# Patient Record
Sex: Male | Born: 2000 | Race: Black or African American | Hispanic: No | Marital: Single | Smoking: Never smoker
Health system: Southern US, Community
[De-identification: ages and names within clinical notes are randomized; demographics above are authoritative.]

## PROBLEM LIST (undated history)

## (undated) DIAGNOSIS — G4730 Sleep apnea, unspecified: Secondary | ICD-10-CM

## (undated) DIAGNOSIS — Z9109 Other allergy status, other than to drugs and biological substances: Secondary | ICD-10-CM

## (undated) DIAGNOSIS — J45909 Unspecified asthma, uncomplicated: Secondary | ICD-10-CM

## (undated) DIAGNOSIS — E039 Hypothyroidism, unspecified: Secondary | ICD-10-CM

## (undated) DIAGNOSIS — F909 Attention-deficit hyperactivity disorder, unspecified type: Secondary | ICD-10-CM

## (undated) DIAGNOSIS — E119 Type 2 diabetes mellitus without complications: Secondary | ICD-10-CM

## (undated) DIAGNOSIS — J439 Emphysema, unspecified: Secondary | ICD-10-CM

## (undated) DIAGNOSIS — A749 Chlamydial infection, unspecified: Secondary | ICD-10-CM

---
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
## 2018-11-25 ENCOUNTER — Emergency Department (HOSPITAL_COMMUNITY): Payer: BLUE CROSS/BLUE SHIELD

## 2018-11-25 ENCOUNTER — Encounter (HOSPITAL_COMMUNITY): Payer: Self-pay | Admitting: Emergency Medicine

## 2018-11-25 ENCOUNTER — Inpatient Hospital Stay (HOSPITAL_COMMUNITY): Payer: BLUE CROSS/BLUE SHIELD

## 2018-11-25 ENCOUNTER — Other Ambulatory Visit: Payer: Self-pay

## 2018-11-25 ENCOUNTER — Inpatient Hospital Stay (HOSPITAL_COMMUNITY)
Admission: EM | Admit: 2018-11-25 | Discharge: 2018-11-28 | DRG: 193 | Disposition: A | Discharge status: Home / Self Care | Payer: BLUE CROSS/BLUE SHIELD | Attending: Internal Medicine | Admitting: Internal Medicine

## 2018-11-25 DIAGNOSIS — E782 Mixed hyperlipidemia: Secondary | ICD-10-CM | POA: Diagnosis present

## 2018-11-25 DIAGNOSIS — J439 Emphysema, unspecified: Secondary | ICD-10-CM

## 2018-11-25 DIAGNOSIS — R079 Chest pain, unspecified: Secondary | ICD-10-CM | POA: Diagnosis not present

## 2018-11-25 DIAGNOSIS — Z7151 Drug abuse counseling and surveillance of drug abuser: Secondary | ICD-10-CM

## 2018-11-25 DIAGNOSIS — E66813 Obesity, class 3: Secondary | ICD-10-CM

## 2018-11-25 DIAGNOSIS — E039 Hypothyroidism, unspecified: Secondary | ICD-10-CM

## 2018-11-25 DIAGNOSIS — R0902 Hypoxemia: Secondary | ICD-10-CM | POA: Diagnosis not present

## 2018-11-25 DIAGNOSIS — R0602 Shortness of breath: Secondary | ICD-10-CM | POA: Diagnosis present

## 2018-11-25 DIAGNOSIS — F329 Major depressive disorder, single episode, unspecified: Secondary | ICD-10-CM | POA: Diagnosis present

## 2018-11-25 DIAGNOSIS — F909 Attention-deficit hyperactivity disorder, unspecified type: Secondary | ICD-10-CM | POA: Diagnosis present

## 2018-11-25 DIAGNOSIS — J180 Bronchopneumonia, unspecified organism: Principal | ICD-10-CM | POA: Diagnosis present

## 2018-11-25 DIAGNOSIS — Z833 Family history of diabetes mellitus: Secondary | ICD-10-CM

## 2018-11-25 DIAGNOSIS — R7989 Other specified abnormal findings of blood chemistry: Secondary | ICD-10-CM | POA: Diagnosis present

## 2018-11-25 DIAGNOSIS — Z68.41 Body mass index (BMI) pediatric, greater than or equal to 95th percentile for age: Secondary | ICD-10-CM

## 2018-11-25 DIAGNOSIS — R030 Elevated blood-pressure reading, without diagnosis of hypertension: Secondary | ICD-10-CM | POA: Diagnosis present

## 2018-11-25 DIAGNOSIS — E8881 Metabolic syndrome: Secondary | ICD-10-CM | POA: Diagnosis present

## 2018-11-25 DIAGNOSIS — E119 Type 2 diabetes mellitus without complications: Secondary | ICD-10-CM | POA: Diagnosis present

## 2018-11-25 DIAGNOSIS — Z79899 Other long term (current) drug therapy: Secondary | ICD-10-CM | POA: Diagnosis not present

## 2018-11-25 DIAGNOSIS — F902 Attention-deficit hyperactivity disorder, combined type: Secondary | ICD-10-CM | POA: Diagnosis present

## 2018-11-25 DIAGNOSIS — Z8249 Family history of ischemic heart disease and other diseases of the circulatory system: Secondary | ICD-10-CM

## 2018-11-25 DIAGNOSIS — F321 Major depressive disorder, single episode, moderate: Secondary | ICD-10-CM | POA: Diagnosis not present

## 2018-11-25 DIAGNOSIS — R778 Other specified abnormalities of plasma proteins: Secondary | ICD-10-CM

## 2018-11-25 DIAGNOSIS — A749 Chlamydial infection, unspecified: Secondary | ICD-10-CM

## 2018-11-25 DIAGNOSIS — R071 Chest pain on breathing: Secondary | ICD-10-CM | POA: Diagnosis not present

## 2018-11-25 DIAGNOSIS — R7303 Prediabetes: Secondary | ICD-10-CM | POA: Diagnosis not present

## 2018-11-25 DIAGNOSIS — J9601 Acute respiratory failure with hypoxia: Secondary | ICD-10-CM | POA: Diagnosis present

## 2018-11-25 DIAGNOSIS — F1210 Cannabis abuse, uncomplicated: Secondary | ICD-10-CM | POA: Diagnosis present

## 2018-11-25 DIAGNOSIS — G4733 Obstructive sleep apnea (adult) (pediatric): Secondary | ICD-10-CM | POA: Diagnosis present

## 2018-11-25 DIAGNOSIS — E669 Obesity, unspecified: Secondary | ICD-10-CM | POA: Diagnosis present

## 2018-11-25 HISTORY — DX: Hypothyroidism, unspecified: E03.9

## 2018-11-25 HISTORY — DX: Emphysema, unspecified: J43.9

## 2018-11-25 HISTORY — DX: Chlamydial infection, unspecified: A74.9

## 2018-11-25 LAB — RAPID URINE DRUG SCREEN, HOSP PERFORMED
Amphetamines: NOT DETECTED
Barbiturates: NOT DETECTED
Benzodiazepines: NOT DETECTED
Cocaine: NOT DETECTED
Opiates: NOT DETECTED
Tetrahydrocannabinol: POSITIVE — AB

## 2018-11-25 LAB — CBC WITH DIFFERENTIAL/PLATELET
Abs Immature Granulocytes: 0.02 10*3/uL (ref 0.00–0.07)
BASOS ABS: 0.1 10*3/uL (ref 0.0–0.1)
Basophils Relative: 1 %
Eosinophils Absolute: 0.2 10*3/uL (ref 0.0–0.5)
Eosinophils Relative: 2 %
HEMATOCRIT: 47.8 % (ref 39.0–52.0)
HEMOGLOBIN: 14.5 g/dL (ref 13.0–17.0)
IMMATURE GRANULOCYTES: 0 %
LYMPHS PCT: 38 %
Lymphs Abs: 3.4 10*3/uL (ref 0.7–4.0)
MCH: 25.8 pg — ABNORMAL LOW (ref 26.0–34.0)
MCHC: 30.3 g/dL (ref 30.0–36.0)
MCV: 85.1 fL (ref 80.0–100.0)
Monocytes Absolute: 0.6 10*3/uL (ref 0.1–1.0)
Monocytes Relative: 6 %
NEUTROS PCT: 53 %
NRBC: 0 % (ref 0.0–0.2)
Neutro Abs: 4.7 10*3/uL (ref 1.7–7.7)
Platelets: 425 10*3/uL — ABNORMAL HIGH (ref 150–400)
RBC: 5.62 MIL/uL (ref 4.22–5.81)
RDW: 15.4 % (ref 11.5–15.5)
WBC: 8.9 10*3/uL (ref 4.0–10.5)

## 2018-11-25 LAB — ECHOCARDIOGRAM COMPLETE
Height: 73 in
WEIGHTICAEL: 6400 [oz_av]

## 2018-11-25 LAB — COMPREHENSIVE METABOLIC PANEL
ALBUMIN: 3.9 g/dL (ref 3.5–5.0)
ALT: 32 U/L (ref 0–44)
AST: 24 U/L (ref 15–41)
Alkaline Phosphatase: 69 U/L (ref 38–126)
Anion gap: 8 (ref 5–15)
BILIRUBIN TOTAL: 0.3 mg/dL (ref 0.3–1.2)
BUN: 12 mg/dL (ref 6–20)
CO2: 27 mmol/L (ref 22–32)
CREATININE: 1.08 mg/dL (ref 0.61–1.24)
Calcium: 8.9 mg/dL (ref 8.9–10.3)
Chloride: 103 mmol/L (ref 98–111)
GFR calc Af Amer: >60 mL/min (ref >60)
GLUCOSE: 100 mg/dL — AB (ref 70–99)
Potassium: 4.5 mmol/L (ref 3.5–5.1)
Sodium: 138 mmol/L (ref 135–145)
TOTAL PROTEIN: 7.5 g/dL (ref 6.5–8.1)

## 2018-11-25 LAB — I-STAT TROPONIN, ED: Troponin i, poc: 0.1 ng/mL (ref 0.00–0.08)

## 2018-11-25 LAB — LIPID PANEL
Cholesterol: 211 mg/dL — ABNORMAL HIGH (ref 0–200)
HDL: 36 mg/dL — ABNORMAL LOW (ref >40)
LDL Cholesterol: 143 mg/dL — ABNORMAL HIGH (ref 0–99)
Total CHOL/HDL Ratio: 5.9 RATIO
Triglycerides: 161 mg/dL — ABNORMAL HIGH (ref <150)
VLDL: 32 mg/dL (ref 0–40)

## 2018-11-25 LAB — BLOOD GAS, ARTERIAL
BICARBONATE: 27.5 mmol/L (ref 20.0–28.0)
PO2 ART: 37.5 mmHg — AB (ref 83.0–108.0)

## 2018-11-25 LAB — MRSA PCR SCREENING: MRSA by PCR: NEGATIVE

## 2018-11-25 LAB — TROPONIN I
TROPONIN I: 0.11 ng/mL — AB (ref <0.03)
Troponin I: 0.15 ng/mL (ref <0.03)
Troponin I: 0.15 ng/mL (ref <0.03)

## 2018-11-25 LAB — INFLUENZA PANEL BY PCR (TYPE A & B)
INFLBPCR: NEGATIVE
Influenza A By PCR: NEGATIVE

## 2018-11-25 LAB — D-DIMER, QUANTITATIVE: D-Dimer, Quant: 0.27 ug/mL-FEU (ref 0.00–0.50)

## 2018-11-25 LAB — TSH: TSH: 8.171 u[IU]/mL — ABNORMAL HIGH (ref 0.350–4.500)

## 2018-11-25 LAB — HEMOGLOBIN A1C
Hgb A1c MFr Bld: 6.3 % — ABNORMAL HIGH (ref 4.8–5.6)
Mean Plasma Glucose: 134.11 mg/dL

## 2018-11-25 MED ORDER — PREDNISONE 50 MG PO TABS
80.0000 mg | ORAL_TABLET | Freq: Two times a day (BID) | ORAL | Status: DC
  Administered 2018-11-25 – 2018-11-28 (×6): 80 mg via ORAL
  Filled 2018-11-25 (×3): qty 1
  Filled 2018-11-25: qty 4
  Filled 2018-11-25 (×2): qty 1
  Filled 2018-11-25: qty 4

## 2018-11-25 MED ORDER — ALBUTEROL SULFATE (2.5 MG/3ML) 0.083% IN NEBU: 2.5000 mg | INHALATION_SOLUTION | Freq: Four times a day (QID) | RESPIRATORY_TRACT | Status: DC | PRN

## 2018-11-25 MED ORDER — IPRATROPIUM-ALBUTEROL 0.5-2.5 (3) MG/3ML IN SOLN
3.0000 mL | Freq: Three times a day (TID) | RESPIRATORY_TRACT | Status: DC
  Administered 2018-11-26 – 2018-11-27 (×4): 3 mL via RESPIRATORY_TRACT
  Filled 2018-11-25 (×4): qty 3

## 2018-11-25 MED ORDER — ALBUTEROL SULFATE HFA 108 (90 BASE) MCG/ACT IN AERS
2.0000 | INHALATION_SPRAY | RESPIRATORY_TRACT | Status: DC | PRN
  Administered 2018-11-25: 2 via RESPIRATORY_TRACT
  Filled 2018-11-25: qty 6.7

## 2018-11-25 MED ORDER — METHYLPREDNISOLONE SODIUM SUCC 125 MG IJ SOLR
80.0000 mg | Freq: Every day | INTRAMUSCULAR | Status: DC
  Filled 2018-11-25: qty 2

## 2018-11-25 MED ORDER — ENOXAPARIN SODIUM 100 MG/ML ~~LOC~~ SOLN
90.0000 mg | Freq: Every day | SUBCUTANEOUS | Status: DC
  Administered 2018-11-25 – 2018-11-28 (×4): 90 mg via SUBCUTANEOUS
  Filled 2018-11-25: qty 0.9
  Filled 2018-11-25 (×3): qty 1

## 2018-11-25 MED ORDER — CHLORHEXIDINE GLUCONATE CLOTH 2 % EX PADS
6.0000 | MEDICATED_PAD | Freq: Every day | CUTANEOUS | Status: DC
  Administered 2018-11-25: 6 via TOPICAL

## 2018-11-25 MED ORDER — IPRATROPIUM-ALBUTEROL 0.5-2.5 (3) MG/3ML IN SOLN
3.0000 mL | Freq: Four times a day (QID) | RESPIRATORY_TRACT | Status: DC
  Administered 2018-11-25 (×3): 3 mL via RESPIRATORY_TRACT
  Filled 2018-11-25 (×2): qty 3

## 2018-11-25 MED ORDER — PREDNISONE 20 MG PO TABS
60.0000 mg | ORAL_TABLET | Freq: Once | ORAL | Status: AC
  Administered 2018-11-25: 60 mg via ORAL
  Filled 2018-11-25: qty 3

## 2018-11-25 MED ORDER — ACETAMINOPHEN 325 MG PO TABS
650.0000 mg | ORAL_TABLET | Freq: Four times a day (QID) | ORAL | Status: DC | PRN
  Administered 2018-11-25 – 2018-11-27 (×3): 650 mg via ORAL
  Filled 2018-11-25 (×3): qty 2

## 2018-11-25 MED ORDER — ACETAMINOPHEN 500 MG PO TABS
1000.0000 mg | ORAL_TABLET | Freq: Once | ORAL | Status: AC
  Administered 2018-11-25: 1000 mg via ORAL
  Filled 2018-11-25: qty 2

## 2018-11-25 MED ORDER — ALBUTEROL SULFATE (2.5 MG/3ML) 0.083% IN NEBU
2.5000 mg | INHALATION_SOLUTION | RESPIRATORY_TRACT | Status: DC | PRN
  Filled 2018-11-25: qty 3

## 2018-11-25 MED ORDER — ALBUTEROL SULFATE (2.5 MG/3ML) 0.083% IN NEBU: 5.0000 mg | INHALATION_SOLUTION | Freq: Once | RESPIRATORY_TRACT | Status: DC

## 2018-11-25 MED ORDER — ORAL CARE MOUTH RINSE
15.0000 mL | Freq: Two times a day (BID) | OROMUCOSAL | Status: DC
  Administered 2018-11-26 – 2018-11-27 (×3): 15 mL via OROMUCOSAL

## 2018-11-25 NOTE — Progress Notes (Addendum)
Pt reported he currently smokes marijuana "mutliple times a day". He said the last time he had vaped was last summer.

## 2018-11-25 NOTE — ED Triage Notes (Signed)
Patient complaining of mid chest pain and not being able to breathe. Patient states this started last night.

## 2018-11-25 NOTE — Progress Notes (Signed)
CRITICAL VALUE ALERT  Critical Value:  Troponin 0.11  Date & Time Notied:  11/25/18 @ 2157  Provider Notified: [facility] call [facility]  Orders Received/Actions taken: No new orders

## 2018-11-25 NOTE — ED Notes (Signed)
Dr. Woods at bedside.

## 2018-11-25 NOTE — H&P (Signed)
[facility] History and Physical  Benjamin Rosario XFG:[id number] DOB: 2001-09-04 DOA: 11/25/2018   PCP: Tracey Harries, MD   Chief Complaint: Acute S OB  HPI: Benjamin Rosario is a 18 y.o. PMHx drug abuse (marijuana), ADHD, MDD, HLD, morbid obesity, prediabetes, chlamydia  presenting today with chest discomfort and shortness of breath.  States positive chest pain center under left breast with positive radiation into the neck and shoulder, negative diaphoresis, negative nausea.  Patient states throughout the day yesterday he felt fine. He was treated for chlamydia yesterday with azithromycin but has had that medication before per his mom. He also admits to smoking marijuana regularly and last smoked marijuana at 9 PM last night but everything was fine. Around midnight when he was getting ready to go to bed he started developing chest soreness. He cannot really recall anything that made it better or worse it just felt uncomfortable as well as shortness of breath. He states throughout the night he was unable to catch his breath and was not able to sleep. The chest soreness just started getting worse and he was becoming more anxious. He denies any cough, cold or congestion symptoms. He has not had fever or sore throat. He has not noticed any leg swelling and is never had chest discomfort like this before.   States positive vaping but stopped last year.  Also believes he has OSA but never officially tested.  Snores heavily at home.  Unsure if he has apneic events (habitus for OHS/OSA).    Review of Systems:  Constitutional:  No weight loss, night sweats, Fevers, chills, fatigue.  HEENT:  No headaches, Difficulty swallowing,Tooth/dental problems,Sore throat,  No sneezing, itching, ear ache, nasal congestion, post nasal drip,  Cardio-vascular:  Positive chest pain, Orthopnea, PND, swelling in lower extremities, anasarca, dizziness, palpitations  GI:  No heartburn, indigestion, abdominal pain,  nausea, vomiting, diarrhea, change in bowel habits, loss of appetite  Resp:  Positive shortness of breath with exertion or at rest. No excess mucus, no productive cough, Positive non-productive cough, No coughing up of blood.No change in color of mucus.No wheezing.No chest wall deformity  Skin:  no rash or lesions.  GU:  no dysuria, change in color of urine, no urgency or frequency. No flank pain.  Musculoskeletal:  No joint pain or swelling. No decreased range of motion. No back pain.  Psych:  No change in mood or affect. No depression or anxiety. No memory loss.   Past Medical History:  Diagnosis Date  . ADHD (attention deficit hyperactivity disorder)   . Chlamydia 11/25/2018  . Environmental allergies   . Hypothyroidism 11/25/2018   History reviewed. No pertinent surgical history. Social History:  reports that he has never smoked. He has never used smokeless tobacco. He reports that he does not drink alcohol or use drugs.  No Known Allergies  Family History  Problem Relation Age of Onset  . Hypertension Father   . Diabetes Father      Prior to Admission medications   Medication Sig Start Date End Date Taking? Authorizing Provider  buPROPion (WELLBUTRIN XL) 300 MG 24 hr tablet Take 1 tablet (300 mg total) by mouth daily. 06/10/13   Charm Rings, NP  lisdexamfetamine (VYVANSE) 50 MG capsule Take 1 capsule (50 mg total) by mouth every morning. 06/10/13   Charm Rings, NP  loratadine (CLARITIN) 10 MG tablet Take 1 tablet (10 mg total) by mouth daily. 06/10/13   Charm Rings, NP     Consultants:  None    Procedures/Significant Events:  3/17 PCXR:Borderline cardiac prominence.  No edema or consolidation.  3/17 CT chest Wo contrast:-Asymmetric peribronchovascular nodularity of the left lung which is suggestive of multifocal bronchopneumonia, potentially related to aspiration given the asymmetry. Pneumonitis secondary to inhalation injury is also considered, though is  favored less likely given the asymmetric pattern. -Developing paraseptal and centrilobular emphysema, most pronounced at the right lung apex.     I have personally reviewed and interpreted all radiology studies and my findings are as above.   VENTILATOR SETTINGS:    Cultures   Antimicrobials: None   Devices    LINES / TUBES:      Continuous Infusions:  Physical Exam: Vitals:   11/25/18 0745 11/25/18 0758 11/25/18 0905 11/25/18 0952  BP:  121/63 (!) 131/43   Pulse: (!) 111 (!) 112 (!) 111 94  Resp: (!) 31 (!) 32 (!) 21 13  Temp:      TempSrc:      SpO2: 98% 93% 90% 99%  Weight:      Height:        Wt Readings from Last 3 Encounters:  11/25/18 (!) 181.4 kg (>99 %, Z= 3.88)*  07/26/15 (!) 144 kg (>99 %, Z= 3.57)*  06/05/13 111.3 kg (>99 %, Z= 3.22)*   * Growth percentiles are based on CDC (Boys, 2-20 Years) data.    General: Positive acute respiratory distress Eyes: negative scleral hemorrhage, negative anisocoria, negative icterus ENT: Negative Runny nose, negative gingival bleeding, Neck:  Negative scars, masses, torticollis, lymphadenopathy, JVD Lungs: Difficult to auscultate secondary to body habitus but appears decreased breath sounds negative wheezes or crackles, tachypneic Cardiovascular: Tachycardic, without murmur gallop or rub normal S1 and S2 Abdomen: Morbidly obese, negative abdominal pain, nondistended, positive soft, bowel sounds, no rebound, no ascites, no appreciable mass Extremities: No significant cyanosis, clubbing, or edema bilateral lower extremities Skin: Negative rashes, lesions, ulcers Psychiatric:  Negative depression, negative anxiety, negative fatigue, negative mania  Central nervous system:  Cranial nerves II through XII intact, tongue/uvula midline, all extremities muscle strength 5/5, sensation intact throughout,  negative dysarthria, negative expressive aphasia, negative receptive aphasia.        Labs on Admission:  Basic  Metabolic Panel: Recent Labs  Lab 11/25/18 0802  NA 138  K 4.5  CL 103  CO2 27  GLUCOSE 100*  BUN 12  CREATININE 1.08  CALCIUM 8.9   Liver Function Tests: Recent Labs  Lab 11/25/18 0802  AST 24  ALT 32  ALKPHOS 69  BILITOT 0.3  PROT 7.5  ALBUMIN 3.9   No results for input(s): LIPASE, AMYLASE in the last 168 hours. No results for input(s): AMMONIA in the last 168 hours. CBC: Recent Labs  Lab 11/25/18 0802  WBC 8.9  NEUTROABS 4.7  HGB 14.5  HCT 47.8  MCV 85.1  PLT 425*   Cardiac Enzymes: No results for input(s): CKTOTAL, CKMB, CKMBINDEX, TROPONINI in the last 168 hours.  BNP (last 3 results) No results for input(s): BNP in the last 8760 hours.  ProBNP (last 3 results) No results for input(s): PROBNP in the last 8760 hours.  CBG: No results for input(s): GLUCAP in the last 168 hours.  Radiological Exams on Admission: Dg Chest Port 1 View  Result Date: 11/25/2018 CLINICAL DATA:  Shortness of breath and chest pain EXAM: PORTABLE CHEST 1 VIEW COMPARISON:  April 12, 2017 FINDINGS: There is no edema or consolidation. Heart is borderline enlarged with pulmonary vascularity normal. No adenopathy. No  bone lesions. IMPRESSION: Borderline cardiac prominence.  No edema or consolidation. Electronically Signed   By: Bretta Bang III M.D.   On: 11/25/2018 07:39    EKG: Independently reviewed.  Sinus tachycardia, borderline right axis deviation  Assessment/Plan Active Problems:   Mixed hyperlipidemia   Obesity   Pre-diabetes   MDD (major depressive disorder), single episode, moderate (HCC)   ADHD (attention deficit hyperactivity disorder), combined type   Hypothyroidism   Chlamydia   SOB (shortness of breath)   Chest pain   Chest pain/ACS -HEART score= 4 -Trend troponin: First troponin positive - Echocardiogram pending -  Acute SOB/marijuana lung? - Solu-Medrol 40 mg x 1 dose -DuoNeb QID -CT scan consistent with pneumonia vs pneumonitis given  patient's history of vaping and smoking marijuana, negative fever, negative WBC Favor pneumonitis. - Start prednisone 80 mg twice daily x3 days.  Will usually see improvement within that time.  If no improvement consider PCCM consult    OHS/OSA? - Patient has some signs and symptoms of syndrome but has never been officially tested. - O2 titrate to maintain SPO2> 92% -CPAP while sleeping - Patient will require referral as outpatient to PCCM for spirometry pre-/post bronchodilation, DLCO, sleep study - ABG with PCO2= 50, most likely retainer secondary to OSA/OHS, however PO2= 37.5 would not be consistent with sleep apnea.  Emphysema - CT scan of the lung shows patient with emphysematous change throughout lung but greatest RUL.  Drug abuse (marijuana) - Patient counseled at length concerning need to absolutely discontinue smoking marijuana     Code Status: Full (DVT Prophylaxis: Lovenox Family Communication: Mother present Disposition Plan: TBD (indicate anticipated LOS)   Data Reviewed: Care during the described time interval was provided by me .  I have reviewed this patient's available data, including medical history, events of note, physical examination, and all test results as part of my evaluation.   Time spent: 60 min  WOODS, Roselind Messier [facility] Pager [phone]

## 2018-11-25 NOTE — ED Notes (Addendum)
Patient put on 4 liters [place]. Patient O2 SAT-92

## 2018-11-25 NOTE — ED Notes (Signed)
Pt placed on 4L [place] after breathing tx.  Dr. Joseph Art made aware.

## 2018-11-25 NOTE — ED Notes (Signed)
Pt initial room air O2 sat was 61%. Pt placed on 2 lpm nasal cannula and sats increased to 85%. Pt increased to 4 lpm sats increased to 97%.

## 2018-11-25 NOTE — ED Notes (Signed)
Pt was on 2L per Dr. Tanna Savoy request.  Pt dropped to 88% on 2L [place].  Pt's O2 bumped up to 3L and O2 rose to 95%. Dr. Tanna Savoy made aware.  Pt not in any acute distress.

## 2018-11-25 NOTE — ED Provider Notes (Signed)
[facility]-EMERGENCY DEPT Provider Note   CSN: [id number] Arrival date & time: 11/25/18  [id number]    History   Chief Complaint Chief Complaint  Patient presents with  . Shortness of Breath    HPI Benjamin Rosario is a 18 y.o. male.     Patient is a 18 year old male with a history of borderline hypertension diabetes and morbid obesity presenting today with chest discomfort and shortness of breath.  Patient states throughout the day yesterday he felt fine.  He was treated for chlamydia yesterday with azithromycin but has had that medication before per his mom.  He also admits to smoking marijuana regularly and last smoked marijuana at 9 PM last night but everything was fine.  Around midnight when he was getting ready to go to bed he started developing chest soreness.  He cannot really recall anything that made it better or worse it just felt uncomfortable as well as shortness of breath.  He states throughout the night he was unable to catch his breath and was not able to sleep.  The chest soreness just started getting worse and he was becoming more anxious.  He denies any cough, cold or congestion symptoms.  He has not had fever or sore throat.  He has not noticed any leg swelling and is never had chest discomfort like this before.  The history is provided by the patient.    Past Medical History:  Diagnosis Date  . ADHD (attention deficit hyperactivity disorder)   . Environmental allergies     Patient Active Problem List   Diagnosis Date Noted  . Concussion with no loss of consciousness 07/27/2015  . ADHD (attention deficit hyperactivity disorder), combined type 06/07/2013  . MDD (major depressive disorder), single episode, moderate (HCC) 06/06/2013  . Mixed hyperlipidemia 02/26/2011  . Obesity 02/26/2011  . Pre-diabetes 02/26/2011  . Other specified acquired hypothyroidism 02/26/2011    History reviewed. No pertinent surgical history.      Home Medications     Prior to Admission medications   Medication Sig Start Date End Date Taking? Authorizing Provider  buPROPion (WELLBUTRIN XL) 300 MG 24 hr tablet Take 1 tablet (300 mg total) by mouth daily. 06/10/13   Charm RingsLord, Jamison Y, NP  lisdexamfetamine (VYVANSE) 50 MG capsule Take 1 capsule (50 mg total) by mouth every morning. 06/10/13   Charm RingsLord, Jamison Y, NP  loratadine (CLARITIN) 10 MG tablet Take 1 tablet (10 mg total) by mouth daily. 06/10/13   Charm RingsLord, Jamison Y, NP    Family History Family History  Problem Relation Age of Onset  . Hypertension Father   . Diabetes Father     Social History Social History   Tobacco Use  . Smoking status: Never Smoker  . Smokeless tobacco: Never Used  Substance Use Topics  . Alcohol use: No    Alcohol/week: 0.0 standard drinks  . Drug use: No     Allergies   Patient has no known allergies.   Review of Systems Review of Systems  All other systems reviewed and are negative.    Physical Exam Updated Vital Signs BP (!) 150/59 (BP Location: Right Arm)   Pulse (!) 119   Temp 98.4 F (36.9 C) (Oral)   Resp (!) 24   Ht 6\' 1"  (1.854 m)   Wt (!) 181.4 kg   SpO2 (!) 64%   BMI 52.77 kg/m   Physical Exam Vitals signs and nursing note reviewed.  Constitutional:      General: He is  not in acute distress.    Appearance: He is well-developed. He is obese.  HENT:     Head: Normocephalic and atraumatic.     Mouth/Throat:     Mouth: Mucous membranes are moist.     Pharynx: Oropharynx is clear. No pharyngeal swelling or oropharyngeal exudate.  Eyes:     Conjunctiva/sclera: Conjunctivae normal.     Pupils: Pupils are equal, round, and reactive to light.  Neck:     Musculoskeletal: Normal range of motion and neck supple.  Cardiovascular:     Rate and Rhythm: Regular rhythm. Tachycardia present.     Heart sounds: No murmur.  Pulmonary:     Effort: Pulmonary effort is normal. Tachypnea present. No respiratory distress.     Breath sounds: No wheezing or  rales.     Comments: Diffuse expiratory wheezing and rhonchi.  Technically difficult exam due to body habitus Abdominal:     General: There is no distension.     Palpations: Abdomen is soft.     Tenderness: There is no abdominal tenderness. There is no guarding or rebound.  Musculoskeletal: Normal range of motion.        General: No tenderness.  Skin:    General: Skin is warm and dry.     Findings: No erythema or rash.  Neurological:     General: No focal deficit present.     Mental Status: He is alert and oriented to person, place, and time.  Psychiatric:        Mood and Affect: Mood normal.        Behavior: Behavior normal.      ED Treatments / Results  Labs (all labs ordered are listed, but only abnormal results are displayed) Labs Reviewed  CBC WITH DIFFERENTIAL/PLATELET - Abnormal; Notable for the following components:      Result Value   MCH 25.8 (*)    Platelets 425 (*)    All other components within normal limits  COMPREHENSIVE METABOLIC PANEL - Abnormal; Notable for the following components:   Glucose, Bld 100 (*)    All other components within normal limits  I-STAT TROPONIN, ED - Abnormal; Notable for the following components:   Troponin i, poc 0.10 (*)    All other components within normal limits  D-DIMER, QUANTITATIVE (NOT AT [facility])  INFLUENZA PANEL BY PCR (TYPE A & B)    EKG EKG Interpretation  Date/Time:  Tuesday November 25 2018 07:03:15 EDT Ventricular Rate:  112 PR Interval:    QRS Duration: 89 QT Interval:  331 QTC Calculation: 452 R Axis:   103 Text Interpretation:  Sinus tachycardia Borderline right axis deviation Borderline T abnormalities, inferior leads Baseline wander in lead(s) V3 No previous ECGs available Confirmed by Glynn Octave [phone]) on 11/25/2018 7:12:28 AM   Radiology Dg Chest Port 1 View  Result Date: 11/25/2018 CLINICAL DATA:  Shortness of breath and chest pain EXAM: PORTABLE CHEST 1 VIEW COMPARISON:  April 12, 2017 FINDINGS:  There is no edema or consolidation. Heart is borderline enlarged with pulmonary vascularity normal. No adenopathy. No bone lesions. IMPRESSION: Borderline cardiac prominence.  No edema or consolidation. Electronically Signed   By: Bretta Bang III M.D.   On: 11/25/2018 07:39    Procedures Procedures (including critical care time)  Medications Ordered in ED Medications  albuterol (PROVENTIL HFA;VENTOLIN HFA) 108 (90 Base) MCG/ACT inhaler 2 puff (has no administration in time range)     Initial Impression / Assessment and Plan / ED Course  I  have reviewed the triage vital signs and the nursing notes.  Pertinent labs & imaging results that were available during my care of the patient were reviewed by me and considered in my medical decision making (see chart for details).       Young male presenting today with sudden onset of chest discomfort and shortness of breath starting last night.  Patient denies any infectious symptoms such as fever cough and has had no travel or high risk exposure to coronavirus.  Patient was hypoxic upon arrival here with a sat of 64%.  He states the chest discomfort has resolved but described as a soreness.  Low suspicion for dissection at this time.  However high suspicion for PE versus cardiac cause versus pulmonary.  He does have combination of wheezing and rhonchi on expiration.  Patient required 4 L of oxygen for improvement of his oxygen saturation.  He has no prior history of asthma.  However he does smoke marijuana regularly.  Last time he smoked was at 9 PM last night but states that he got it where he normally does and it was not any different than normal.  EKG with sinus tachycardia, chest x-ray, CBC, BMP, troponin and d-dimer pending  8:47 AM Labs are significant for a mild elevated troponin of 0.10 which is most likely related to strain, CBC within normal limits, CMP without acute findings.  D-dimer is normal.  Chest x-ray without significant  findings.  On repeat evaluation patient's breath sounds have improved but still present with some wheezing on the left side.  Patient's oxygen able to be weaned down to 2 from 4.  Plan will be is to give prednisone, test flu and admit for further observation and management. Final Clinical Impressions(s) / ED Diagnoses   Final diagnoses:  SOB (shortness of breath)  Hypoxia  Elevated troponin    ED Discharge Orders    None       Gwyneth Sprout, MD 11/25/18 [phone]

## 2018-11-25 NOTE — Progress Notes (Signed)
Md given update via phone. Pt's Troponin level 0.15 and ABGS on 4l [place] with PCO2 50.1 and PO2 37.5. Continuous Pulse Ox with desaturations on 4L [place] dropping to low 70s. Order to transfer Pt to stepdown Pt and Pt's Mother updated and reassurance given to Pt.

## 2018-11-25 NOTE — ED Notes (Signed)
DR. Anitra Lauth notified of patient's Troponin result.

## 2018-11-25 NOTE — Progress Notes (Signed)
  Echocardiogram 2D Echocardiogram has been performed.  Benjamin Rosario 11/25/2018, 12:28 PM

## 2018-11-25 NOTE — ED Notes (Signed)
ED TO INPATIENT HANDOFF REPORT  ED Nurse Name and Phone #: Christeen DouglasMerle, [phone]  S Name/Age/Gender Benjamin Rosario 18 y.o. male Room/Bed: WA12/WA12  Code Status   Code Status: Full Code  Home/SNF/Other Home Patient oriented to: self, place, time and situation Is this baseline? Yes   Triage Complete: Triage complete  Chief Complaint SOB  Triage Note Patient complaining of mid chest pain and not being able to breathe. Patient states this started last night.    Allergies No Known Allergies  Level of Care/Admitting Diagnosis ED Disposition    ED Disposition Condition Comment   Admit  [facility][id number]  Level of Care: Telemetry [5]  Admit to tele based on following criteria: Other see comments  Comments: Chest pain  Diagnosis: Chest pain [[id number]]  Admitting Physician: Drema DallasWOODS, CURTIS J [[id number]]  Attending Physician: Drema DallasWOODS, CURTIS J [[id number]]  Estimated length of stay: 3 - 4 days  Certification:: I certify this patient will need inpatient services for at least 2 midnights  PT Class (Do Not Modify): Inpatient [101]  PT Acc Code (Do Not Modify): Private [1]       B Medical/Surgery History Past Medical History:  Diagnosis Date  . ADHD (attention deficit hyperactivity disorder)   . Chlamydia 11/25/2018  . Environmental allergies   . Hypothyroidism 11/25/2018   History reviewed. No pertinent surgical history.   A IV Location/Drains/Wounds Patient Lines/Drains/Airways Status   Active Line/Drains/Airways    Name:   Placement date:   Placement time:   Site:   Days:   Peripheral IV 11/25/18 Right Antecubital   11/25/18    0757    Antecubital   less than 1          Intake/Output Last 24 hours No intake or output data in the 24 hours ending 11/25/18 1030  Labs/Imaging Results for orders placed or performed during the hospital encounter of 11/25/18 (from the past 48 hour(s))  CBC with Differential/Platelet     Status: Abnormal    Collection Time: 11/25/18  8:02 AM  Result Value Ref Range   WBC 8.9 4.0 - 10.5 K/uL   RBC 5.62 4.22 - 5.81 MIL/uL   Hemoglobin 14.5 13.0 - 17.0 g/dL   HCT 78.447.8 69.639.0 - 29.552.0 %   MCV 85.1 80.0 - 100.0 fL   MCH 25.8 (L) 26.0 - 34.0 pg   MCHC 30.3 30.0 - 36.0 g/dL   RDW 28.415.4 13.211.5 - 44.015.5 %   Platelets 425 (H) 150 - 400 K/uL   nRBC 0.0 0.0 - 0.2 %   Neutrophils Relative % 53 %   Neutro Abs 4.7 1.7 - 7.7 K/uL   Lymphocytes Relative 38 %   Lymphs Abs 3.4 0.7 - 4.0 K/uL   Monocytes Relative 6 %   Monocytes Absolute 0.6 0.1 - 1.0 K/uL   Eosinophils Relative 2 %   Eosinophils Absolute 0.2 0.0 - 0.5 K/uL   Basophils Relative 1 %   Basophils Absolute 0.1 0.0 - 0.1 K/uL   Immature Granulocytes 0 %   Abs Immature Granulocytes 0.02 0.00 - 0.07 K/uL    Comment: Performed at [facility], [address] [place], [place] [address]  Comprehensive metabolic panel     Status: Abnormal   Collection Time: 11/25/18  8:02 AM  Result Value Ref Range   Sodium 138 135 - 145 mmol/L   Potassium 4.5 3.5 - 5.1 mmol/L   Chloride 103 98 - 111 mmol/L   CO2 27 22 -  32 mmol/L   Glucose, Bld 100 (H) 70 - 99 mg/dL   BUN 12 6 - 20 mg/dL   Creatinine, Ser 4.91 0.61 - 1.24 mg/dL   Calcium 8.9 8.9 - 79.1 mg/dL   Total Protein 7.5 6.5 - 8.1 g/dL   Albumin 3.9 3.5 - 5.0 g/dL   AST 24 15 - 41 U/L   ALT 32 0 - 44 U/L   Alkaline Phosphatase 69 38 - 126 U/L   Total Bilirubin 0.3 0.3 - 1.2 mg/dL   GFR calc non Af Amer >60 >60 mL/min   GFR calc Af Amer >60 >60 mL/min   Anion gap 8 5 - 15    Comment: Performed at [facility], [address], [place], [place] [address]  D-dimer, quantitative (not at [facility])     Status: None   Collection Time: 11/25/18  8:02 AM  Result Value Ref Range   D-Dimer, Quant 0.27 0.00 - 0.50 ug/mL-FEU    Comment: (NOTE) At the manufacturer cut-off of 0.50 ug/mL FEU, this assay has been documented to exclude PE with a sensitivity and negative  predictive value of 97 to 99%.  At this time, this assay has not been approved by the FDA to exclude DVT/VTE. Results should be correlated with clinical presentation. Performed at [facility], [address], [place], [place] [address]   Lipid panel     Status: Abnormal   Collection Time: 11/25/18  8:02 AM  Result Value Ref Range   Cholesterol 211 (H) 0 - 200 mg/dL   Triglycerides 165 (H) <150 mg/dL   HDL 36 (L) >53 mg/dL   Total CHOL/HDL Ratio 5.9 RATIO   VLDL 32 0 - 40 mg/dL   LDL Cholesterol 748 (H) 0 - 99 mg/dL    Comment:        Total Cholesterol/HDL:CHD Risk Coronary Heart Disease Risk Table                     Men   Women  1/2 Average Risk   3.4   3.3  Average Risk       5.0   4.4  2 X Average Risk   9.6   7.1  3 X Average Risk  23.4   11.0        Use the calculated Patient Ratio above and the CHD Risk Table to determine the patient's CHD Risk.        ATP III CLASSIFICATION (LDL):  <100     mg/dL   Optimal  270-786  mg/dL   Near or Above                    Optimal  130-159  mg/dL   Borderline  754-492  mg/dL   High  >010     mg/dL   Very High Performed at [facility], [address], [place], [place] [address]   I-stat troponin, ED     Status: Abnormal   Collection Time: 11/25/18  8:07 AM  Result Value Ref Range   Troponin i, poc 0.10 (HH) 0.00 - 0.08 ng/mL   Comment NOTIFIED PHYSICIAN    Comment 3            Comment: Due to the release kinetics of cTnI, a negative result within the first hours of the onset of symptoms does not rule out myocardial infarction with certainty. If myocardial infarction is still suspected, repeat the test at appropriate  intervals.   Influenza panel by PCR (type A & B)     Status: None   Collection Time: 11/25/18  8:47 AM  Result Value Ref Range   Influenza A By PCR NEGATIVE NEGATIVE   Influenza B By PCR NEGATIVE NEGATIVE    Comment: (NOTE) The Xpert Xpress Flu assay is intended as an  aid in the diagnosis of  influenza and should not be used as a sole basis for treatment.  This  assay is FDA approved for nasopharyngeal swab specimens only. Nasal  washings and aspirates are unacceptable for Xpert Xpress Flu testing. Performed at [facility], [address], [place], [place] [address]    Dg Chest Port 1 View  Result Date: 11/25/2018 CLINICAL DATA:  Shortness of breath and chest pain EXAM: PORTABLE CHEST 1 VIEW COMPARISON:  April 12, 2017 FINDINGS: There is no edema or consolidation. Heart is borderline enlarged with pulmonary vascularity normal. No adenopathy. No bone lesions. IMPRESSION: Borderline cardiac prominence.  No edema or consolidation. Electronically Signed   By: Bretta Bang III M.D.   On: 11/25/2018 07:39    Pending Labs Unresulted Labs (From admission, onward)    Start     Ordered   12/02/18 0500  Creatinine, serum  (enoxaparin (LOVENOX)    CrCl >/= 30 ml/min)  Weekly,   R    Comments:  while on enoxaparin therapy    11/25/18 0930   11/25/18 0930  Troponin I - Now Then Q6H  Now then every 6 hours,   R     11/25/18 0930   11/25/18 0930  TSH  Once,   R     11/25/18 0930   11/25/18 0930  Culture, sputum-assessment  Once,   R     11/25/18 0930   11/25/18 0930  Hemoglobin A1c  Once,   R     11/25/18 0930   11/25/18 0924  HIV antibody (Routine Testing)  Once,   R     11/25/18 0930   11/25/18 0915  Rapid urine drug screen (hospital performed)  ONCE - STAT,   R     11/25/18 0914          Vitals/Pain Today's Vitals   11/25/18 0758 11/25/18 0905 11/25/18 0952 11/25/18 1013  BP: 121/63 (!) 131/43    Pulse: (!) 112 (!) 111 94   Resp: (!) 32 (!) 21 13   Temp:      TempSrc:      SpO2: 93% 90% 99%   Weight:      Height:      PainSc: 7    8     Isolation Precautions Droplet precaution  Medications Medications  albuterol (PROVENTIL HFA;VENTOLIN HFA) 108 (90 Base) MCG/ACT inhaler 2 puff (2 puffs Inhalation Given 11/25/18  0746)  enoxaparin (LOVENOX) injection 40 mg (has no administration in time range)  ipratropium-albuterol (DUONEB) 0.5-2.5 (3) MG/3ML nebulizer solution 3 mL (3 mLs Nebulization Given 11/25/18 1022)  methylPREDNISolone sodium succinate (SOLU-MEDROL) 125 mg/2 mL injection 80 mg (has no administration in time range)  predniSONE (DELTASONE) tablet 60 mg (60 mg Oral Given 11/25/18 0900)  acetaminophen (TYLENOL) tablet 1,000 mg (1,000 mg Oral Given 11/25/18 0900)    Mobility walks Low fall risk   Focused Assessments Pulmonary Assessment Handoff:  Lung sounds: Bilateral Breath Sounds: Diminished O2 Device: Nasal Cannula O2 Flow Rate (L/min): 2 L/min      R Recommendations: See Admitting Provider Note  Report given to: Victorino Dike, RN on  4th floor  Additional Notes:

## 2018-11-26 DIAGNOSIS — J9601 Acute respiratory failure with hypoxia: Secondary | ICD-10-CM

## 2018-11-26 LAB — GLUCOSE, CAPILLARY: Glucose-Capillary: 169 mg/dL — ABNORMAL HIGH (ref 70–99)

## 2018-11-26 LAB — HIV ANTIBODY (ROUTINE TESTING W REFLEX): HIV Screen 4th Generation wRfx: NONREACTIVE

## 2018-11-26 MED ORDER — LEVOTHYROXINE SODIUM 50 MCG PO TABS
75.0000 ug | ORAL_TABLET | Freq: Every day | ORAL | Status: DC
  Administered 2018-11-27 – 2018-11-28 (×2): 75 ug via ORAL
  Filled 2018-11-26 (×2): qty 1

## 2018-11-26 MED ORDER — SODIUM CHLORIDE 0.9 % IV SOLN
100.0000 mg | Freq: Two times a day (BID) | INTRAVENOUS | Status: DC
  Administered 2018-11-26: 100 mg via INTRAVENOUS
  Filled 2018-11-26 (×2): qty 100

## 2018-11-26 MED ORDER — SODIUM CHLORIDE 0.9 % IV SOLN
2.0000 g | INTRAVENOUS | Status: DC
  Administered 2018-11-27 – 2018-11-28 (×2): 2 g via INTRAVENOUS
  Filled 2018-11-26 (×2): qty 2

## 2018-11-26 MED ORDER — SODIUM CHLORIDE 0.9 % IV SOLN
1.0000 g | Freq: Once | INTRAVENOUS | Status: AC
  Administered 2018-11-26: 1 g via INTRAVENOUS
  Filled 2018-11-26: qty 1

## 2018-11-26 MED ORDER — DOXYCYCLINE HYCLATE 100 MG PO TABS
100.0000 mg | ORAL_TABLET | Freq: Two times a day (BID) | ORAL | Status: DC
  Administered 2018-11-26 – 2018-11-28 (×4): 100 mg via ORAL
  Filled 2018-11-26 (×4): qty 1

## 2018-11-26 MED ORDER — SODIUM CHLORIDE 0.9 % IV SOLN
1.0000 g | INTRAVENOUS | Status: DC
  Administered 2018-11-26: 1 g via INTRAVENOUS
  Filled 2018-11-26: qty 1

## 2018-11-26 NOTE — Progress Notes (Signed)
Received pt from ICU instable condition, CPAP at bedside, O2 at 5 liters. Pt resting without distress. Finace' at bedside with patient. VSs. Reassessed, plan of care continues. SRP, RN

## 2018-11-26 NOTE — Progress Notes (Signed)
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   Assessment & Plan:   Active Problems:   Mixed hyperlipidemia   Obesity   Pre-diabetes   MDD (major depressive disorder), single episode, moderate (HCC)   ADHD (attention deficit hyperactivity disorder), combined type   Hypothyroidism   Chlamydia   SOB (shortness of breath)   Chest pain   Emphysema of lung (HCC)   #) Acute hypoxic respiratory failure due to bronchopneumonia: Currently the most likely causes infectious, possibly viral versus bacterial.  RV failure does suggest the possibility of pulmonary embolism however his d-dimer is essentially normal.  Of note CT scan did show some segmental emphysema. -Continue IV ceftriaxone and doxycycline started 11/26/2018 -Sputum culture pending -Wean oxygen as tolerated  #) Elevated troponins: Likely secondary to RV stress from hypoxia - Downtrending  #) Undiagnosed OSA/RV dysfunction: Unclear if RV dysfunction is secondary to acute hypoxia versus chronic from longstanding likely undiagnosed OSA. -Outpatient pulmonary follow-up -Outpatient cardiology follow-up -Encourage CPAP while inpatient  #) Hypothyroidism: Patient carries a diagnosis of this but was not on any treatment -Start levothyroxine 75 mcg daily -TSH mildly elevated to greater than 8  #) Class III obesity/metabolic syndrome: -Encouraged outpatient follow-up for consideration of gastric bypass surgery  Fluids: Tolerating p.o. Electrolytes: Monitor and supplement Nutrition: Regular diet  Prophylaxis: Enoxaparin  Disposition: Pending improvement of hypoxia  Full  code     Consultants:   none  Procedures:  Echo 11/25/2018:  1. The left ventricle has normal systolic function with an ejection fraction of 60-65%. The cavity size was normal. There is mildly increased left ventricular wall thickness. Left ventricular diastolic Doppler parameters are consistent with impaired  relaxation. Indeterminate filling pressures The E/e' is 8-15. No evidence of left ventricular regional wall motion abnormalities.  2. The right ventricle has moderately reduced systolic function. The cavity was mildly enlarged. There is no increase in right ventricular wall thickness.  3. The mitral valve is degenerative. Mild thickening of the mitral valve leaflet.  4. The aortic valve was not well visualized Mild calcification of the aortic valve.  5. The aortic root and ascending aorta are normal in size and structure.   6. The interatrial septum was not well visualized.  Antimicrobials:   IV ceftriaxone and doxycycline started 11/26/2018   Subjective: This morning the patient reports he is doing fairly well.  He apparently had profound desaturation when ambulating to the bathroom but is generally comfortable when he is lying down flat.  He denies any nausea, vomiting, diarrhea, cough, congestion, chest pain.  Objective: Vitals:   11/26/18 0400 11/26/18 0500 11/26/18 0600 11/26/18 0925  BP: (!) 125/29  137/75   Pulse: 81 86 91 87  Resp: (!) 24 (!) 29 (!) 27 17  Temp: 98.8 F (37.1 C)     TempSrc: Oral     SpO2: 99% 100% 99% 100%  Weight:  (!) 188.6 kg    Height:        Intake/Output Summary (Last 24 hours) at 11/26/2018 1000 Last data filed at 11/26/2018 0500 Gross per 24 hour  Intake 660 ml  Output --  Net 660 ml   [employer]  11/25/18 0657 11/25/18 1323 11/26/18 0500  Weight: (!) 181.4 kg (!) 169 kg (!) 188.6 kg    Examination:  General exam: Appears calm and comfortable  Respiratory system: No increased work of breathing, diminished lung sounds at  bases, no wheezes, crackles, rhonchi Cardiovascular system: Distant heart sounds, regular rate and rhythm, no murmurs Gastrointestinal system: Abdomen is nondistended, soft and nontender. No organomegaly or masses felt. Normal bowel sounds heard. Central nervous system: Alert and oriented.  Grossly intact, moving all extremities Extremities: Trace lower extremity edema Skin: No rashes over visible skin Psychiatry: Judgement and insight appear normal. Mood & affect appropriate.     Data Reviewed: I have personally reviewed following labs and imaging studies  CBC: Recent Labs  Lab 11/25/18 0802  WBC 8.9  NEUTROABS 4.7  HGB 14.5  HCT 47.8  MCV 85.1  PLT 425*   Basic Metabolic Panel: Recent Labs  Lab 11/25/18 0802  NA 138  K 4.5  CL 103  CO2 27  GLUCOSE 100*  BUN 12  CREATININE 1.08  CALCIUM 8.9   GFR: Estimated Creatinine Clearance: 189.8 mL/min (by C-G formula based on SCr of 1.08 mg/dL). Liver Function Tests: Recent Labs  Lab 11/25/18 0802  AST 24  ALT 32  ALKPHOS 69  BILITOT 0.3  PROT 7.5  ALBUMIN 3.9   No results for input(s): LIPASE, AMYLASE in the last 168 hours. No results for input(s): AMMONIA in the last 168 hours. Coagulation Profile: No results for input(s): INR, PROTIME in the last 168 hours. Cardiac Enzymes: Recent Labs  Lab 11/25/18 1020 11/25/18 1513 11/25/18 2110  TROPONINI 0.15* 0.15* 0.11*   BNP (last 3 results) No results for input(s): PROBNP in the last 8760 hours. HbA1C: Recent Labs    11/25/18 0802  HGBA1C 6.3*   CBG: Recent Labs  Lab 11/26/18 0754  GLUCAP 169*   Lipid Profile: Recent Labs    11/25/18 0802  CHOL 211*  HDL 36*  LDLCALC 143*  TRIG 161*  CHOLHDL 5.9   Thyroid Function Tests: Recent Labs    11/25/18 0802  TSH 8.171*   Anemia Panel: No results for input(s): VITAMINB12, FOLATE, FERRITIN, TIBC, IRON, RETICCTPCT in the last 72 hours. Sepsis Labs: No results for input(s): PROCALCITON, LATICACIDVEN  in the last 168 hours.  Recent Results (from the past 240 hour(s))  MRSA PCR Screening     Status: None   Collection Time: 11/25/18  1:16 PM  Result Value Ref Range Status   MRSA by PCR NEGATIVE NEGATIVE Final    Comment:        The GeneXpert MRSA Assay (FDA approved for NASAL specimens only), is one component of a comprehensive MRSA colonization surveillance program. It is not intended to diagnose MRSA infection nor to guide or monitor treatment for MRSA infections. Performed at [facility], [address], [place], [place] [address]          Radiology Studies: Ct Chest Wo Contrast  Result Date: 11/25/2018 CLINICAL DATA:  18 year old male with a history of shortness of breath EXAM: CT CHEST WITHOUT CONTRAST TECHNIQUE: Multidetector CT imaging of the chest was performed following the standard protocol without IV contrast. COMPARISON:  None. FINDINGS: Cardiovascular: Heart size within normal limits. No pericardial fluid/thickening. No atherosclerotic changes. Normal course caliber and contour of the thoracic aorta. Mediastinum/Nodes: Small mediastinal lymph nodes, not enlarged. Unremarkable thoracic inlet. No supraclavicular or axillary lymphadenopathy. Lungs/Pleura: Paraseptal emphysema with early bullous change at the right lung apex and pleuroparenchymal  thickening. There is a strikingly asymmetric pattern of peribronchovascular nodularity with both confluent nodularity and ground-glass, predominantly left-sided. This pattern involves both left upper lobe and left lower lobe. No pleural effusions. No pneumothorax. Upper Abdomen: No acute finding Musculoskeletal: No acute displaced fracture. No significant degenerative changes. IMPRESSION: CT imaging demonstrates asymmetric peribronchovascular nodularity of the left lung which is suggestive of multifocal bronchopneumonia, potentially related to aspiration given the asymmetry. Pneumonitis secondary to inhalation injury  is also considered, though is favored less likely given the asymmetric pattern. Developing paraseptal and centrilobular emphysema, most pronounced at the right lung apex. Electronically Signed   By: Gilmer Mor D.O.   On: 11/25/2018 14:35   Dg Chest Port 1 View  Result Date: 11/25/2018 CLINICAL DATA:  Shortness of breath and chest pain EXAM: PORTABLE CHEST 1 VIEW COMPARISON:  April 12, 2017 FINDINGS: There is no edema or consolidation. Heart is borderline enlarged with pulmonary vascularity normal. No adenopathy. No bone lesions. IMPRESSION: Borderline cardiac prominence.  No edema or consolidation. Electronically Signed   By: Bretta Bang III M.D.   On: 11/25/2018 07:39        Scheduled Meds:  Chlorhexidine Gluconate Cloth  6 each Topical Daily   enoxaparin (LOVENOX) injection  90 mg Subcutaneous Daily   ipratropium-albuterol  3 mL Nebulization TID   mouth rinse  15 mL Mouth Rinse BID   predniSONE  80 mg Oral BID WC   Continuous Infusions:  cefTRIAXone (ROCEPHIN)  IV     doxycycline (VIBRAMYCIN) IV       LOS: 1 day    Time spent: 35    Delaine Lame, MD [facility]  If 7PM-7AM, please contact night-coverage [email or web address] Password [email or web address] 11/26/2018, 10:00 AM

## 2018-11-26 NOTE — Progress Notes (Signed)
PHARMACY NOTE:  ANTIMICROBIAL RENAL DOSAGE ADJUSTMENT  Current antimicrobial regimen includes a mismatch between antimicrobial dosage and estimated renal function.  As per policy approved by the Pharmacy & Therapeutics and Medical Executive Committees, the antimicrobial dosage will be adjusted accordingly.  Current antimicrobial dosage:  Ceftriaxone 1g IV q24h  Indication: CAP, bronchopneumonia  Renal Function: Estimated Creatinine Clearance: 189.8 mL/min (by C-G formula based on SCr of 1.08 mg/dL).    Antimicrobial dosage has been changed to:  Increase to ceftriaxone 2g IV q24h due to weight 188 kg.   Thank you for allowing pharmacy to be a part of this patient's care.  Lynann Beaver PharmD, BCPS Pager [phone] 11/26/2018 10:19 AM

## 2018-11-26 NOTE — Progress Notes (Signed)
Pt's IV infiltrated while receiving doxycycline, reporting pain from his hand to his shoulder.  Contacted Christine with pharmacy who advised hot or cold based on comfort with elevation but no specific measures are needed.

## 2018-11-26 NOTE — Progress Notes (Signed)
CPAP adjusted per Pt request but against RT wishes.  RT to monitor as needed.

## 2018-11-26 NOTE — Progress Notes (Signed)
Patient placed on salter @ 7 L [place] w/ O2 sats of 95-99%; patient encouraged to remain on [place]. Will attempt to wean patient as tolerated.

## 2018-11-27 LAB — CBC
HCT: 45.5 % (ref 39.0–52.0)
Hemoglobin: 13.8 g/dL (ref 13.0–17.0)
MCH: 25.8 pg — ABNORMAL LOW (ref 26.0–34.0)
MCHC: 30.3 g/dL (ref 30.0–36.0)
MCV: 85.2 fL (ref 80.0–100.0)
Platelets: 438 10*3/uL — ABNORMAL HIGH (ref 150–400)
RBC: 5.34 MIL/uL (ref 4.22–5.81)
RDW: 15.7 % — ABNORMAL HIGH (ref 11.5–15.5)
WBC: 15.7 K/uL — ABNORMAL HIGH (ref 4.0–10.5)
nRBC: 0 % (ref 0.0–0.2)

## 2018-11-27 LAB — BASIC METABOLIC PANEL
BUN: 17 mg/dL (ref 6–20)
CO2: 23 mmol/L (ref 22–32)
Calcium: 8.9 mg/dL (ref 8.9–10.3)
Chloride: 102 mmol/L (ref 98–111)
Creatinine, Ser: 0.93 mg/dL (ref 0.61–1.24)
GFR calc Af Amer: >60 mL/min (ref >60)
GFR calc non Af Amer: >60 mL/min (ref >60)
Glucose, Bld: 239 mg/dL — ABNORMAL HIGH (ref 70–99)
Potassium: 4.4 mmol/L (ref 3.5–5.1)
Sodium: 135 mmol/L (ref 135–145)

## 2018-11-27 LAB — GLUCOSE, CAPILLARY
Glucose-Capillary: 77 mg/dL (ref 70–99)
Glucose-Capillary: 155 mg/dL — ABNORMAL HIGH (ref 70–99)

## 2018-11-27 LAB — BASIC METABOLIC PANEL WITH GFR: Anion gap: 10 (ref 5–15)

## 2018-11-27 MED ORDER — ZOLPIDEM TARTRATE 5 MG PO TABS
5.0000 mg | ORAL_TABLET | Freq: Every evening | ORAL | Status: DC | PRN
  Administered 2018-11-27: 5 mg via ORAL
  Filled 2018-11-27: qty 1

## 2018-11-27 MED ORDER — ZOLPIDEM TARTRATE 5 MG PO TABS
ORAL_TABLET | ORAL | Status: AC
  Administered 2018-11-27: 5 mg
  Filled 2018-11-27: qty 1

## 2018-11-27 MED ORDER — IPRATROPIUM-ALBUTEROL 0.5-2.5 (3) MG/3ML IN SOLN
3.0000 mL | Freq: Two times a day (BID) | RESPIRATORY_TRACT | Status: DC
  Administered 2018-11-27 – 2018-11-28 (×2): 3 mL via RESPIRATORY_TRACT
  Filled 2018-11-27 (×2): qty 3

## 2018-11-27 NOTE — Progress Notes (Signed)
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   Assessment & Plan:   Active Problems:   Mixed hyperlipidemia   Obesity   Pre-diabetes   MDD (major depressive disorder), single episode, moderate (HCC)   ADHD (attention deficit hyperactivity disorder), combined type   Hypothyroidism   Chlamydia   SOB (shortness of breath)   Chest pain   Emphysema of lung (HCC)   #) Acute hypoxic respiratory failure due to bronchopneumonia: Currently the most likely causes infectious, possibly viral versus bacterial.  RV failure does suggest the possibility of pulmonary embolism however his d-dimer is essentially normal.  Of note CT scan did show some segmental emphysema. -Continue IV ceftriaxone and doxycycline started 11/26/2018 -Sputum culture pending -Wean oxygen as tolerated  #) Elevated troponins: Likely secondary to RV stress from hypoxia.  Resolved  #) Undiagnosed OSA/RV dysfunction: Unclear if RV dysfunction is secondary to acute hypoxia versus chronic from longstanding likely undiagnosed OSA. -Outpatient pulmonary follow-up -Outpatient cardiology follow-up -Encourage CPAP while inpatient  #) Hypothyroidism: Patient carries a diagnosis of this but was not on any treatment -Continue levothyroxine 75 mcg daily, repeat TSH in 6 weeks which is approximately January 08, 2019 -TSH mildly elevated to greater than 8  #) Class III obesity/metabolic syndrome: -Encouraged outpatient follow-up for consideration of gastric bypass surgery  Fluids: Tolerating p.o. Electrolytes: Monitor and supplement Nutrition: Regular diet  Prophylaxis:  Enoxaparin  Disposition: Pending improvement of hypoxia  Full code     Consultants:   none  Procedures:  Echo 11/25/2018:  1. The left ventricle has normal systolic function with an ejection fraction of 60-65%. The cavity size was normal. There is mildly increased left ventricular wall thickness. Left ventricular diastolic Doppler parameters are consistent with impaired  relaxation. Indeterminate filling pressures The E/e' is 8-15. No evidence of left ventricular regional wall motion abnormalities.  2. The right ventricle has moderately reduced systolic function. The cavity was mildly enlarged. There is no increase in right ventricular wall thickness.  3. The mitral valve is degenerative. Mild thickening of the mitral valve leaflet.  4. The aortic valve was not well visualized Mild calcification of the aortic valve.  5. The aortic root and ascending aorta are normal in size and structure.   6. The interatrial septum was not well visualized.  Antimicrobials:   IV ceftriaxone and doxycycline started 11/26/2018   Subjective: This morning patient is sleeping in bed.  He denies any chest pain, shortness of breath, nausea, vomiting, diarrhea, cough, congestion, rhinorrhea.  Objective: Vitals:   11/26/18 2047 11/27/18 0455 11/27/18 0457 11/27/18 0839  BP: (!) 125/59 (!) 179/81    Pulse: 97 89    Resp: 18 18    Temp: 98.2 F (36.8 C) 98.2 F (36.8 C)    TempSrc: Oral Oral    SpO2: 97% 100%  100%  Weight:   (!) 186.5 kg   Height:        Intake/Output Summary (Last 24 hours) at 11/27/2018 0920 Last data filed at 11/26/2018 1411 Gross per 24 hour  Intake 439.55 ml  Output --  Net 439.55 ml   [employer]   11/26/18 0500 11/26/18 1648 11/27/18 0457  Weight: Marland Kitchen)  188.6 kg (!) 186.8 kg (!) 186.5 kg    Examination:  General exam: Appears calm and comfortable  Respiratory system: No increased work of breathing, diminished lung sounds at bases, no wheezes, crackles,  rhonchi Cardiovascular system: Distant heart sounds, regular rate and rhythm, no murmurs Gastrointestinal system: Abdomen is nondistended, soft and nontender. No organomegaly or masses felt. Normal bowel sounds heard. Central nervous system: Alert and oriented.  Grossly intact, moving all extremities Extremities: Trace lower extremity edema Skin: No rashes over visible skin Psychiatry: Judgement and insight appear normal. Mood & affect appropriate.     Data Reviewed: I have personally reviewed following labs and imaging studies  CBC: Recent Labs  Lab 11/25/18 0802 11/27/18 0350  WBC 8.9 15.7*  NEUTROABS 4.7  --   HGB 14.5 13.8  HCT 47.8 45.5  MCV 85.1 85.2  PLT 425* 438*   Basic Metabolic Panel: Recent Labs  Lab 11/25/18 0802 11/27/18 0350  NA 138 135  K 4.5 4.4  CL 103 102  CO2 27 23  GLUCOSE 100* 239*  BUN 12 17  CREATININE 1.08 0.93  CALCIUM 8.9 8.9   GFR: Estimated Creatinine Clearance: 219 mL/min (by C-G formula based on SCr of 0.93 mg/dL). Liver Function Tests: Recent Labs  Lab 11/25/18 0802  AST 24  ALT 32  ALKPHOS 69  BILITOT 0.3  PROT 7.5  ALBUMIN 3.9   No results for input(s): LIPASE, AMYLASE in the last 168 hours. No results for input(s): AMMONIA in the last 168 hours. Coagulation Profile: No results for input(s): INR, PROTIME in the last 168 hours. Cardiac Enzymes: Recent Labs  Lab 11/25/18 1020 11/25/18 1513 11/25/18 2110  TROPONINI 0.15* 0.15* 0.11*   BNP (last 3 results) No results for input(s): PROBNP in the last 8760 hours. HbA1C: Recent Labs    11/25/18 0802  HGBA1C 6.3*   CBG: Recent Labs  Lab 11/25/18 1123 11/26/18 0754 11/27/18 0742  GLUCAP 77 169* 155*   Lipid Profile: Recent Labs    11/25/18 0802  CHOL 211*  HDL 36*  LDLCALC 143*  TRIG 161*  CHOLHDL 5.9   Thyroid Function Tests: Recent Labs    11/25/18 0802  TSH 8.171*   Anemia Panel: No results for input(s): VITAMINB12, FOLATE, FERRITIN, TIBC,  IRON, RETICCTPCT in the last 72 hours. Sepsis Labs: No results for input(s): PROCALCITON, LATICACIDVEN in the last 168 hours.  Recent Results (from the past 240 hour(s))  MRSA PCR Screening     Status: None   Collection Time: 11/25/18  1:16 PM  Result Value Ref Range Status   MRSA by PCR NEGATIVE NEGATIVE Final    Comment:        The GeneXpert MRSA Assay (FDA approved for NASAL specimens only), is one component of a comprehensive MRSA colonization surveillance program. It is not intended to diagnose MRSA infection nor to guide or monitor treatment for MRSA infections. Performed at [facility], [address] [place], [place] [address]          Radiology Studies: Ct Chest Wo Contrast  Result Date: 11/25/2018 CLINICAL DATA:  18 year old male with a history of shortness of breath EXAM: CT CHEST WITHOUT CONTRAST TECHNIQUE: Multidetector CT imaging of the chest was performed following the standard protocol without IV contrast. COMPARISON:  None. FINDINGS: Cardiovascular: Heart size within normal limits. No pericardial fluid/thickening. No atherosclerotic changes. Normal course caliber and contour of the thoracic aorta. Mediastinum/Nodes: Small mediastinal lymph nodes, not enlarged. Unremarkable thoracic inlet. No supraclavicular  or axillary lymphadenopathy. Lungs/Pleura: Paraseptal emphysema with early bullous change at the right lung apex and pleuroparenchymal thickening. There is a strikingly asymmetric pattern of peribronchovascular nodularity with both confluent nodularity and ground-glass, predominantly left-sided. This pattern involves both left upper lobe and left lower lobe. No pleural effusions. No pneumothorax. Upper Abdomen: No acute finding Musculoskeletal: No acute displaced fracture. No significant degenerative changes. IMPRESSION: CT imaging demonstrates asymmetric peribronchovascular nodularity of the left lung which is suggestive of multifocal  bronchopneumonia, potentially related to aspiration given the asymmetry. Pneumonitis secondary to inhalation injury is also considered, though is favored less likely given the asymmetric pattern. Developing paraseptal and centrilobular emphysema, most pronounced at the right lung apex. Electronically Signed   By: Gilmer Mor D.O.   On: 11/25/2018 14:35        Scheduled Meds:  doxycycline  100 mg Oral Q12H   enoxaparin (LOVENOX) injection  90 mg Subcutaneous Daily   ipratropium-albuterol  3 mL Nebulization TID   levothyroxine  75 mcg Oral Q0600   mouth rinse  15 mL Mouth Rinse BID   predniSONE  80 mg Oral BID WC   Continuous Infusions:  cefTRIAXone (ROCEPHIN)  IV 2 g (11/27/18 0916)     LOS: 2 days    Time spent: 35    Delaine Lame, MD [facility]  If 7PM-7AM, please contact night-coverage [email or web address] Password [email or web address] 11/27/2018, 9:20 AM

## 2018-11-27 NOTE — Progress Notes (Signed)
Assumed care from previous RN. Agree with earlier assessment. Pt resting quietly, but c/o a headache. PRN given. O2 sats 99% on room air. Melton Alar, RN

## 2018-11-27 NOTE — Progress Notes (Signed)
SATURATION QUALIFICATIONS: (This note is used to comply with regulatory documentation for home oxygen)  Patient Saturations on Room Air at Rest 99%  Patient Saturations on Room Air while Ambulating  89% walking 2 laps (360 ft times 2) around unit.  Patient Saturations on 0 liters of oxygen while Ambulating 89%   Please briefly explain why patient needs home oxygen:   SRP, RN

## 2018-11-27 NOTE — Progress Notes (Signed)
Pt requiring O2 4l during the night, SOB at times and deSat while sleeping. Pt currently sleeping with CPAP sats at 93-94% on 4 liters. SRP, RN

## 2018-11-28 LAB — GLUCOSE, CAPILLARY: Glucose-Capillary: 82 mg/dL (ref 70–99)

## 2018-11-28 MED ORDER — ALBUTEROL SULFATE HFA 108 (90 BASE) MCG/ACT IN AERS: 2.0000 | INHALATION_SPRAY | Freq: Four times a day (QID) | RESPIRATORY_TRACT | 2 refills | Status: DC | PRN

## 2018-11-28 MED ORDER — PREDNISONE 10 MG PO TABS: 40.0000 mg | ORAL_TABLET | Freq: Every day | ORAL | 0 refills | Status: AC

## 2018-11-28 MED ORDER — LEVOTHYROXINE SODIUM 75 MCG PO TABS: 75.0000 ug | ORAL_TABLET | Freq: Every day | ORAL | 0 refills | Status: DC

## 2018-11-28 MED ORDER — DOXYCYCLINE HYCLATE 100 MG PO TABS: 100.0000 mg | ORAL_TABLET | Freq: Two times a day (BID) | ORAL | 0 refills | Status: AC

## 2018-11-28 NOTE — Discharge Summary (Signed)
Physician Discharge Summary  Benjamin Rosario ZOX:[id number] DOB: June 22, 2001 DOA: 11/25/2018  PCP: Tracey Harries, MD  Admit date: 11/25/2018 Discharge date: 11/28/2018  Admitted From: Home Disposition: Home  Recommendations for Outpatient Follow-up:  1. Follow up with PCP in 1-2 weeks 2. Please obtain BMP/CBC in one week 3. Please have your TSH checked near the end of April 2020 4. Please have a sleep study performed as an outpatient for possible CPAP evaluation  Home Health: No Equipment/Devices: None  Discharge Condition: Stable CODE STATUS: Full Diet recommendation: Heart Healthy  Brief/Interim Summary:  #) Acute hypoxic respiratory failure due to bronchopneumonia: Patient was admitted with acute hypoxic respiratory failure.  CT scan of the chest did not show any pulmonary embolism but did show evidence of bronchopneumonia.  Patient was started on IV ceftriaxone, doxycycline, prednisone.  Patient was discharged on oral doxycycline and oral prednisone.  Sputum culture was negative.  Patient was weaned to room air on discharge.  #) Class III obesity/undiagnosed obstructive sleep apnea: Overnight patient did require CPAP as he had was persistently hypoxic.  Patient was scheduled for an outpatient sleep study.  He additionally should be considered as an outpatient for possible gastric bypass surgery.  #) RV dysfunction: Echo performed on 11/25/2018 showed normal EF but did show evidence of moderately reduced RV dysfunction.  It was unclear if this is related to undiagnosed pulmonary hypertension from sleep apnea versus acute hypoxia.  Troponins were mildly elevated but downtrending.  #) Hypothyroidism: Patient carries a diagnosis of this but was not on any treatment.  Patient was started on levothyroxine 75 mcg daily.  He should have his  TSH in 6 weeks which is approximately January 08, 2019.  Discharge Diagnoses:  Active Problems:   Mixed hyperlipidemia   Obesity   Pre-diabetes   MDD  (major depressive disorder), single episode, moderate (HCC)   ADHD (attention deficit hyperactivity disorder), combined type   Hypothyroidism   Chlamydia   SOB (shortness of breath)   Chest pain   Emphysema of lung [facility])    Discharge Instructions  Discharge Instructions    Ambulatory referral to Sleep Studies   Complete by:  As directed    Call MD for:  difficulty breathing, headache or visual disturbances   Complete by:  As directed    Call MD for:  extreme fatigue   Complete by:  As directed    Call MD for:  hives   Complete by:  As directed    Call MD for:  persistant dizziness or light-headedness   Complete by:  As directed    Call MD for:  persistant nausea and vomiting   Complete by:  As directed    Call MD for:  redness, tenderness, or signs of infection (pain, swelling, redness, odor or green/yellow discharge around incision site)   Complete by:  As directed    Call MD for:  severe uncontrolled pain   Complete by:  As directed    Call MD for:  temperature >100.4   Complete by:  As directed    Diet - low sodium heart healthy   Complete by:  As directed    Discharge instructions   Complete by:  As directed    Please complete your antibiotics and steroids as prescribed.  Please follow-up with the lung doctor as an outpatient to have a sleep study scheduled.   Increase activity slowly   Complete by:  As directed      Allergies as of 11/28/2018  No Known Allergies     Medication List    STOP taking these medications   azithromycin 250 MG tablet Commonly known as:  ZITHROMAX   buPROPion 300 MG 24 hr tablet Commonly known as:  WELLBUTRIN XL   lisdexamfetamine 50 MG capsule Commonly known as:  VYVANSE   loratadine 10 MG tablet Commonly known as:  CLARITIN     TAKE these medications   acetaminophen 325 MG tablet Commonly known as:  TYLENOL Take 650 mg by mouth every 6 (six) hours as needed for mild pain or headache.   albuterol 108 (90 Base) MCG/ACT  inhaler Commonly known as:  PROVENTIL HFA;VENTOLIN HFA Inhale 2 puffs into the lungs every 6 (six) hours as needed for wheezing or shortness of breath.   diphenhydrAMINE 25 mg capsule Commonly known as:  BENADRYL Take 25 mg by mouth every 6 (six) hours as needed for sleep.   doxycycline 100 MG tablet Commonly known as:  VIBRA-TABS Take 1 tablet (100 mg total) by mouth every 12 (twelve) hours for 4 days.   levothyroxine 75 MCG tablet Commonly known as:  SYNTHROID, LEVOTHROID Take 1 tablet (75 mcg total) by mouth daily at 6 (six) AM. Start taking on:  November 29, 2018   predniSONE 10 MG tablet Commonly known as:  DELTASONE Take 4 tablets (40 mg total) by mouth daily for 3 days.   sodium chloride 0.65 % Soln nasal spray Commonly known as:  OCEAN Place 1 spray into both nostrils as needed for congestion.       No Known Allergies  Consultations:  Pulmonary/critical care   Procedures/Studies: Ct Chest Wo Contrast  Result Date: 11/25/2018 CLINICAL DATA:  18 year old male with a history of shortness of breath EXAM: CT CHEST WITHOUT CONTRAST TECHNIQUE: Multidetector CT imaging of the chest was performed following the standard protocol without IV contrast. COMPARISON:  None. FINDINGS: Cardiovascular: Heart size within normal limits. No pericardial fluid/thickening. No atherosclerotic changes. Normal course caliber and contour of the thoracic aorta. Mediastinum/Nodes: Small mediastinal lymph nodes, not enlarged. Unremarkable thoracic inlet. No supraclavicular or axillary lymphadenopathy. Lungs/Pleura: Paraseptal emphysema with early bullous change at the right lung apex and pleuroparenchymal thickening. There is a strikingly asymmetric pattern of peribronchovascular nodularity with both confluent nodularity and ground-glass, predominantly left-sided. This pattern involves both left upper lobe and left lower lobe. No pleural effusions. No pneumothorax. Upper Abdomen: No acute finding  Musculoskeletal: No acute displaced fracture. No significant degenerative changes. IMPRESSION: CT imaging demonstrates asymmetric peribronchovascular nodularity of the left lung which is suggestive of multifocal bronchopneumonia, potentially related to aspiration given the asymmetry. Pneumonitis secondary to inhalation injury is also considered, though is favored less likely given the asymmetric pattern. Developing paraseptal and centrilobular emphysema, most pronounced at the right lung apex. Electronically Signed   By: Gilmer Mor D.O.   On: 11/25/2018 14:35   Dg Chest Port 1 View  Result Date: 11/25/2018 CLINICAL DATA:  Shortness of breath and chest pain EXAM: PORTABLE CHEST 1 VIEW COMPARISON:  April 12, 2017 FINDINGS: There is no edema or consolidation. Heart is borderline enlarged with pulmonary vascularity normal. No adenopathy. No bone lesions. IMPRESSION: Borderline cardiac prominence.  No edema or consolidation. Electronically Signed   By: Bretta Bang III M.D.   On: 11/25/2018 07:39    Echo 11/25/2018: 1. The left ventricle has normal systolic function with an ejection fraction of 60-65%. The cavity size was normal. There is mildly increased left ventricular wall thickness. Left ventricular diastolic Doppler parameters are  consistent with impaired  relaxation. Indeterminate filling pressures The E/e' is 8-15. No evidence of left ventricular regional wall motion abnormalities. 2. The right ventricle has moderately reduced systolic function. The cavity was mildly enlarged. There is no increase in right ventricular wall thickness. 3. The mitral valve is degenerative. Mild thickening of the mitral valve leaflet. 4. The aortic valve was not well visualized Mild calcification of the aortic valve. 5. The aortic root and ascending aorta are normal in size and structure.  6. The interatrial septum was not well visualized.    Subjective:   Discharge Exam: Vitals:   11/28/18 0530  11/28/18 0811  BP: 133/73   Pulse: 84   Resp: 16   Temp: 97.8 F (36.6 C)   SpO2: 98% 100%   Vitals:   11/27/18 2140 11/28/18 0530 11/28/18 0530 11/28/18 0811  BP: (!) 149/49 133/73 133/73   Pulse: 96 85 84   Resp: 16 16 16    Temp: 98.2 F (36.8 C) 97.8 F (36.6 C) 97.8 F (36.6 C)   TempSrc: Oral Oral Oral   SpO2: 92% 99% 98% 100%  Weight:  (!) 185.2 kg    Height:        General exam: Appears calm and comfortable  Respiratory system: No increased work of breathing, diminished lung sounds at bases, no wheezes, crackles, rhonchi Cardiovascular system: Distant heart sounds, regular rate and rhythm, no murmurs Gastrointestinal system: Abdomen is nondistended, soft and nontender. No organomegaly or masses felt. Normal bowel sounds heard. Central nervous system: Alert and oriented.  Grossly intact, moving all extremities Extremities: Trace lower extremity edema Skin: No rashes over visible skin Psychiatry: Judgement and insight appear normal. Mood & affect appropriate.     The results of significant diagnostics from this hospitalization (including imaging, microbiology, ancillary and laboratory) are listed below for reference.     Microbiology: Recent Results (from the past 240 hour(s))  MRSA PCR Screening     Status: None   Collection Time: 11/25/18  1:16 PM  Result Value Ref Range Status   MRSA by PCR NEGATIVE NEGATIVE Final    Comment:        The GeneXpert MRSA Assay (FDA approved for NASAL specimens only), is one component of a comprehensive MRSA colonization surveillance program. It is not intended to diagnose MRSA infection nor to guide or monitor treatment for MRSA infections. Performed at [facility], [address] [place], [place] [address]      Labs: BNP (last 3 results) No results for input(s): BNP in the last 8760 hours. Basic Metabolic Panel: Recent Labs  Lab 11/25/18 0802 11/27/18 0350  NA 138 135  K 4.5 4.4  CL 103  102  CO2 27 23  GLUCOSE 100* 239*  BUN 12 17  CREATININE 1.08 0.93  CALCIUM 8.9 8.9   Liver Function Tests: Recent Labs  Lab 11/25/18 0802  AST 24  ALT 32  ALKPHOS 69  BILITOT 0.3  PROT 7.5  ALBUMIN 3.9   No results for input(s): LIPASE, AMYLASE in the last 168 hours. No results for input(s): AMMONIA in the last 168 hours. CBC: Recent Labs  Lab 11/25/18 0802 11/27/18 0350  WBC 8.9 15.7*  NEUTROABS 4.7  --   HGB 14.5 13.8  HCT 47.8 45.5  MCV 85.1 85.2  PLT 425* 438*   Cardiac Enzymes: Recent Labs  Lab 11/25/18 1020 11/25/18 1513 11/25/18 2110  TROPONINI 0.15* 0.15* 0.11*   BNP: Invalid input(s): POCBNP CBG: Recent Labs  Lab 11/25/18 1123 11/26/18 0754 11/27/18 0742 11/28/18 0729  GLUCAP 77 169* 155* 82   D-Dimer No results for input(s): DDIMER in the last 72 hours. Hgb A1c No results for input(s): HGBA1C in the last 72 hours. Lipid Profile No results for input(s): CHOL, HDL, LDLCALC, TRIG, CHOLHDL, LDLDIRECT in the last 72 hours. Thyroid function studies No results for input(s): TSH, T4TOTAL, T3FREE, THYROIDAB in the last 72 hours.  Invalid input(s): FREET3 Anemia work up No results for input(s): VITAMINB12, FOLATE, FERRITIN, TIBC, IRON, RETICCTPCT in the last 72 hours. Urinalysis    Component Value Date/Time   COLORURINE YELLOW 06/09/2013 0731   APPEARANCEUR CLEAR 06/09/2013 0731   LABSPEC 1.020 06/09/2013 0731   PHURINE 6.0 06/09/2013 0731   GLUCOSEU NEGATIVE 06/09/2013 0731   HGBUR NEGATIVE 06/09/2013 0731   BILIRUBINUR NEGATIVE 06/09/2013 0731   KETONESUR NEGATIVE 06/09/2013 0731   PROTEINUR NEGATIVE 06/09/2013 0731   UROBILINOGEN 0.2 06/09/2013 0731   NITRITE NEGATIVE 06/09/2013 0731   LEUKOCYTESUR NEGATIVE 06/09/2013 0731   Sepsis Labs Invalid input(s): PROCALCITONIN,  WBC,  LACTICIDVEN Microbiology Recent Results (from the past 240 hour(s))  MRSA PCR Screening     Status: None   Collection Time: 11/25/18  1:16 PM  Result Value  Ref Range Status   MRSA by PCR NEGATIVE NEGATIVE Final    Comment:        The GeneXpert MRSA Assay (FDA approved for NASAL specimens only), is one component of a comprehensive MRSA colonization surveillance program. It is not intended to diagnose MRSA infection nor to guide or monitor treatment for MRSA infections. Performed at [facility], [address] [place], [place] [address]      Time coordinating discharge: 32  SIGNED:   Delaine Lame, MD  [facility] 11/28/2018, 10:07 AM  If 7PM-7AM, please contact night-coverage [email or web address] Password [email or web address]

## 2018-11-28 NOTE — Progress Notes (Signed)
Patient is stable for discharge. Discharge instructions and medications have been reviewed with the patient and all questions answered. AVS and prescriptions given to patient. Patient given PRN inhaler that was being used while he was in the hospital.   Renee Pain, Joyce Copa 11/28/2018 11:17 AM

## 2018-11-28 NOTE — Discharge Instructions (Signed)
1. Follow up with PCP in 1-2 weeks 2. Please obtain BMP/CBC in one week 3. Please have your TSH checked near the end of April 2020 4. Please have a sleep study performed as an outpatient for possible CPAP evaluation           Sleep Apnea Sleep apnea is a condition in which breathing pauses or becomes shallow during sleep. Episodes of sleep apnea usually last 10 seconds or longer, and they may occur as many as 20 times an hour. Sleep apnea disrupts your sleep and keeps your body from getting the rest that it needs. This condition can increase your risk of certain health problems, including:  Heart attack.  Stroke.  Obesity.  Diabetes.  Heart failure.  Irregular heartbeat. There are three kinds of sleep apnea:  Obstructive sleep apnea. This kind is caused by a blocked or collapsed airway.  Central sleep apnea. This kind happens when the part of the brain that controls breathing does not send the correct signals to the muscles that control breathing.  Mixed sleep apnea. This is a combination of obstructive and central sleep apnea. What are the causes? The most common cause of this condition is a collapsed or blocked airway. An airway can collapse or become blocked if:  Your throat muscles are abnormally relaxed.  Your tongue and tonsils are larger than normal.  You are overweight.  Your airway is smaller than normal. What increases the risk? This condition is more likely to develop in people who:  Are overweight.  Smoke.  Have a smaller than normal airway.  Are elderly.  Are male.  Drink alcohol.  Take sedatives or tranquilizers.  Have a family history of sleep apnea. What are the signs or symptoms? Symptoms of this condition include:  Trouble staying asleep.  Daytime sleepiness and tiredness.  Irritability.  Loud snoring.  Morning headaches.  Trouble concentrating.  Forgetfulness.  Decreased interest in sex.  Unexplained  sleepiness.  Mood swings.  Personality changes.  Feelings of depression.  Waking up often during the night to urinate.  Dry mouth.  Sore throat. How is this diagnosed? This condition may be diagnosed with:  A medical history.  A physical exam.  A series of tests that are done while you are sleeping (sleep study). These tests are usually done in a sleep lab, but they may also be done at home. How is this treated? Treatment for this condition aims to restore normal breathing and to ease symptoms during sleep. It may involve managing health issues that can affect breathing, such as high blood pressure or obesity. Treatment may include:  Sleeping on your side.  Using a decongestant if you have nasal congestion.  Avoiding the use of depressants, including alcohol, sedatives, and narcotics.  Losing weight if you are overweight.  Making changes to your diet.  Quitting smoking.  Using a device to open your airway while you sleep, such as: ? An oral appliance. This is a custom-made mouthpiece that shifts your lower jaw forward. ? A continuous positive airway pressure (CPAP) device. This device delivers oxygen to your airway through a mask. ? A nasal expiratory positive airway pressure (EPAP) device. This device has valves that you put into each nostril. ? A bi-level positive airway pressure (BPAP) device. This device delivers oxygen to your airway through a mask.  Surgery if other treatments do not work. During surgery, excess tissue is removed to create a wider airway. It is important to get treatment for sleep apnea.  Without treatment, this condition can lead to:  High blood pressure.  Coronary artery disease.  (Men) An inability to achieve or maintain an erection (impotence).  Reduced thinking abilities. Follow these instructions at home:  Make any lifestyle changes that your health care provider recommends.  Eat a healthy, well-balanced diet.  Take  over-the-counter and prescription medicines only as told by your health care provider.  Avoid using depressants, including alcohol, sedatives, and narcotics.  Take steps to lose weight if you are overweight.  If you were given a device to open your airway while you sleep, use it only as told by your health care provider.  Do not use any tobacco products, such as cigarettes, chewing tobacco, and e-cigarettes. If you need help quitting, ask your health care provider.  Keep all follow-up visits as told by your health care provider. This is important. Contact a health care provider if:  The device that you received to open your airway during sleep is uncomfortable or does not seem to be working.  Your symptoms do not improve.  Your symptoms get worse. Get help right away if:  You develop chest pain.  You develop shortness of breath.  You develop discomfort in your back, arms, or stomach.  You have trouble speaking.  You have weakness on one side of your body.  You have drooping in your face. These symptoms may represent a serious problem that is an emergency. Do not wait to see if the symptoms will go away. Get medical help right away. Call your local emergency services (911 in the [place] Do not drive yourself to the hospital. This information is not intended to replace advice given to you by your health care provider. Make sure you discuss any questions you have with your health care provider. Document Released: 08/17/2002 Document Revised: 03/25/2017 Document Reviewed: 06/06/2015 Elsevier Interactive Patient Education  2019 [employer].

## 2019-04-13 LAB — BLOOD GAS, ARTERIAL
Acid-Base Excess: 1.6 mmol/L (ref 0.0–2.0)
Drawn by: 331471
O2 Content: 4 L/min
O2 Saturation: 66.6 %
Patient temperature: 98.6
pCO2 arterial: 50.1 mmHg — ABNORMAL HIGH (ref 32.0–48.0)
pH, Arterial: 7.358 (ref 7.350–7.450)

## 2019-06-04 ENCOUNTER — Ambulatory Visit (INDEPENDENT_AMBULATORY_CARE_PROVIDER_SITE_OTHER): Payer: BLUE CROSS/BLUE SHIELD | Admitting: Pulmonary Disease

## 2019-06-04 ENCOUNTER — Encounter: Payer: Self-pay | Admitting: Pulmonary Disease

## 2019-06-04 ENCOUNTER — Other Ambulatory Visit: Payer: Self-pay

## 2019-06-04 VITALS — BP 138/90 | HR 102 | Temp 97.1°F | Ht 73.0 in | Wt >= 6400 oz

## 2019-06-04 DIAGNOSIS — J438 Other emphysema: Secondary | ICD-10-CM

## 2019-06-04 DIAGNOSIS — R0683 Snoring: Secondary | ICD-10-CM

## 2019-06-04 DIAGNOSIS — Z6841 Body Mass Index (BMI) 40.0 and over, adult: Secondary | ICD-10-CM

## 2019-06-04 NOTE — Patient Instructions (Signed)
Will schedule high resolution CT chest and pulmonary function test Will schedule for home sleep study

## 2019-06-04 NOTE — Progress Notes (Signed)
   Subjective:    Patient ID: Benjamin Rosario, male    DOB: 04/23/2001, 18 y.o.   MRN: [id number]  HPI    Review of Systems  Constitutional: Positive for fatigue.  HENT: Negative.   Eyes: Negative.   Respiratory: Positive for apnea, cough and shortness of breath.   Cardiovascular: Negative.   Gastrointestinal: Negative.   Endocrine: Negative.   Genitourinary: Negative.   Musculoskeletal: Negative.   Skin: Negative.   Allergic/Immunologic: Negative.   Neurological: Positive for headaches.  Hematological: Negative.   Psychiatric/Behavioral: Positive for agitation.       Objective:   Physical Exam        Assessment & Plan:

## 2019-06-04 NOTE — Progress Notes (Signed)
[facility]  Chief Complaint  Patient presents with  . Consult    Shortness of breath. Went to ER March 2020 and told to come to our office for follow up.    Constitutional:  BP 138/90 (BP Location: Right Arm, Patient Position: Sitting, Cuff Size: Large)   Pulse (!) 102   Temp (!) 97.1 F (36.2 C)   Ht 6\' 1"  (1.854 m)   Wt (!) 431 lb 6.4 oz (195.7 kg)   SpO2 96% Comment: on room air  BMI 56.92 kg/m   Past Medical History:  Hypothyroidism, ADHD, Meningitis  Brief Summary:  Benjamin Rosario is a 18 y.o. male former smoker with snoring, cough, and shortness of breath.  He was in hospital in March 2020 with pneumonia and hypoxic respiratory failure.  He was having trouble with his breathing prior to this.  He snores and would stop breathing.  His mother works as a Marine scientist, and is convinced he has sleep apnea.  He has trouble staying awake during the day.  He used to smoke cigarettes and THC.  He was told he had a heart valve problem as a child, but echo from March 2020 was unremarkable.  He gets cough and chest congestion intermittently.  He has albuterol inhaler and this helps his breathing.  CT chest (reviewed by me) from March 2020 showed paraseptal emphysema and infiltrates.  No imaging studies since then.  No family history of lung disease.  Denies occupational or animal exposures.  From [place].  Lived in [place] since age 25.  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  Used CPAP in the hospital and this helped his breathing.  Physical Exam:   Appearance - well kempt   ENMT - clear nasal mucosa, midline nasal  septum, no oral exudates, no LAN, trachea midline  Respiratory - normal chest wall, normal respiratory effort, no accessory muscle use, no wheeze/rales  CV - s1s2 regular rate and rhythm, no murmurs, no peripheral edema, radial pulses symmetric  GI - soft, non tender, no masses  Lymph - no adenopathy noted in neck and axillary  areas  MSK - normal gait  Ext - no cyanosis, clubbing, or joint inflammation noted  Skin - no rashes, lesions, or ulcers  Neuro - normal strength, oriented x 3  Psych - normal mood and affect   Assessment/Plan:   Cough and dyspnea with paraseptal emphysema noted on CT chest from March 3299. - not certain the cause of these imaging findings - asked him to d/w his parents about whether he had childhood respiratory issues - advised him to avoid smoking again - continue prn albuterol for now - check HRCT chest and PFT  Snoring with excessive daytime sleepiness. - will need to arrange for a home sleep study  Obesity. - discussed how weight can impact sleep and risk for sleep disordered breathing - discussed options to assist with weight loss: combination of diet modification, cardiovascular and strength training exercises  Cardiovascular risk. - had an extensive discussion regarding the adverse health consequences related to untreated sleep disordered breathing - specifically discussed the risks for hypertension, coronary artery disease, cardiac dysrhythmias, cerebrovascular disease, and diabetes - lifestyle modification discussed  Safe driving practices. - discussed how sleep disruption can increase risk of accidents, particularly when driving - safe driving practices were discussed  Therapies for obstructive sleep apnea. - if the sleep study shows significant sleep apnea, then various therapies for treatment were reviewed: CPAP, oral appliance,  and surgical interventions   Patient Instructions  Will schedule high resolution CT chest and pulmonary function test Will schedule for home sleep study    [name], MD [facility] Pager: [phone] 06/04/2019, 5:19 PM  Flow Sheet     Pulmonary tests:  ABG 04/13/19 >> pH 7.35, PCO2 50.1, PO2 37.5  Chest imaging:  CT chest 11/25/18 >> paraseptal emphysema with early bullous changes and apical  thickening, asymmetric peribronchovascular nodularity  Sleep tests:    Cardiac tests:  Echo 11/25/18 >> EF 60 to 65%, mod RV systolic dysfx   Review of Systems:  Constitutional: Positive for fatigue.  HENT: Negative.   Eyes: Negative.   Respiratory: Positive for apnea, cough and shortness of breath.   Cardiovascular: Negative.   Gastrointestinal: Negative.   Endocrine: Negative.   Genitourinary: Negative.   Musculoskeletal: Negative.   Skin: Negative.   Allergic/Immunologic: Negative.   Neurological: Positive for headaches.  Hematological: Negative.   Psychiatric/Behavioral: Positive for agitation.   Medications:   Allergies as of 06/04/2019   No Known Allergies     Medication List       Accurate as of June 04, 2019  5:19 PM. If you have any questions, ask your nurse or doctor.        acetaminophen 325 MG tablet Commonly known as: TYLENOL Take 650 mg by mouth every 6 (six) hours as needed for mild pain or headache.   albuterol 108 (90 Base) MCG/ACT inhaler Commonly known as: VENTOLIN HFA Inhale 2 puffs into the lungs every 6 (six) hours as needed for wheezing or shortness of breath.   diphenhydrAMINE 25 mg capsule Commonly known as: BENADRYL Take 25 mg by mouth every 6 (six) hours as needed for sleep.   levothyroxine 75 MCG tablet Commonly known as: SYNTHROID Take 1 tablet (75 mcg total) by mouth daily at 6 (six) AM.   sodium chloride 0.65 % Soln nasal spray Commonly known as: OCEAN Place 1 spray into both nostrils as needed for congestion.       Past Surgical History:  He denies prior surgeries.  Family History:  His family history includes Diabetes in his father; Hypertension in his father.  Social History:  He  reports that he has never smoked. He has never used smokeless tobacco. He reports that he does not drink alcohol or use drugs.

## 2019-06-07 ENCOUNTER — Inpatient Hospital Stay (HOSPITAL_COMMUNITY)
Admission: EM | Admit: 2019-06-07 | Discharge: 2019-06-09 | DRG: 193 | Disposition: A | Discharge status: Home / Self Care | Payer: BLUE CROSS/BLUE SHIELD | Attending: Internal Medicine | Admitting: Internal Medicine

## 2019-06-07 ENCOUNTER — Other Ambulatory Visit: Payer: Self-pay

## 2019-06-07 ENCOUNTER — Emergency Department (HOSPITAL_COMMUNITY): Payer: BLUE CROSS/BLUE SHIELD

## 2019-06-07 ENCOUNTER — Encounter (HOSPITAL_COMMUNITY): Payer: Self-pay | Admitting: Emergency Medicine

## 2019-06-07 DIAGNOSIS — Z7951 Long term (current) use of inhaled steroids: Secondary | ICD-10-CM

## 2019-06-07 DIAGNOSIS — F321 Major depressive disorder, single episode, moderate: Secondary | ICD-10-CM | POA: Diagnosis present

## 2019-06-07 DIAGNOSIS — J9621 Acute and chronic respiratory failure with hypoxia: Secondary | ICD-10-CM | POA: Diagnosis present

## 2019-06-07 DIAGNOSIS — E662 Morbid (severe) obesity with alveolar hypoventilation: Secondary | ICD-10-CM | POA: Diagnosis present

## 2019-06-07 DIAGNOSIS — T380X5A Adverse effect of glucocorticoids and synthetic analogues, initial encounter: Secondary | ICD-10-CM | POA: Diagnosis not present

## 2019-06-07 DIAGNOSIS — Z20828 Contact with and (suspected) exposure to other viral communicable diseases: Secondary | ICD-10-CM | POA: Diagnosis present

## 2019-06-07 DIAGNOSIS — R9389 Abnormal findings on diagnostic imaging of other specified body structures: Secondary | ICD-10-CM | POA: Diagnosis not present

## 2019-06-07 DIAGNOSIS — Z6841 Body Mass Index (BMI) 40.0 and over, adult: Secondary | ICD-10-CM

## 2019-06-07 DIAGNOSIS — Z8249 Family history of ischemic heart disease and other diseases of the circulatory system: Secondary | ICD-10-CM

## 2019-06-07 DIAGNOSIS — J9611 Chronic respiratory failure with hypoxia: Secondary | ICD-10-CM | POA: Diagnosis not present

## 2019-06-07 DIAGNOSIS — F1290 Cannabis use, unspecified, uncomplicated: Secondary | ICD-10-CM | POA: Diagnosis present

## 2019-06-07 DIAGNOSIS — E1169 Type 2 diabetes mellitus with other specified complication: Secondary | ICD-10-CM | POA: Diagnosis not present

## 2019-06-07 DIAGNOSIS — J9601 Acute respiratory failure with hypoxia: Secondary | ICD-10-CM | POA: Diagnosis not present

## 2019-06-07 DIAGNOSIS — J432 Centrilobular emphysema: Secondary | ICD-10-CM | POA: Diagnosis present

## 2019-06-07 DIAGNOSIS — E039 Hypothyroidism, unspecified: Secondary | ICD-10-CM | POA: Diagnosis present

## 2019-06-07 DIAGNOSIS — R0602 Shortness of breath: Secondary | ICD-10-CM | POA: Diagnosis present

## 2019-06-07 DIAGNOSIS — E1165 Type 2 diabetes mellitus with hyperglycemia: Secondary | ICD-10-CM | POA: Diagnosis not present

## 2019-06-07 DIAGNOSIS — I10 Essential (primary) hypertension: Secondary | ICD-10-CM | POA: Diagnosis present

## 2019-06-07 DIAGNOSIS — J439 Emphysema, unspecified: Secondary | ICD-10-CM | POA: Diagnosis not present

## 2019-06-07 DIAGNOSIS — E782 Mixed hyperlipidemia: Secondary | ICD-10-CM | POA: Diagnosis present

## 2019-06-07 DIAGNOSIS — J189 Pneumonia, unspecified organism: Secondary | ICD-10-CM | POA: Diagnosis present

## 2019-06-07 DIAGNOSIS — J430 Unilateral pulmonary emphysema [MacLeod's syndrome]: Secondary | ICD-10-CM | POA: Diagnosis not present

## 2019-06-07 DIAGNOSIS — Z833 Family history of diabetes mellitus: Secondary | ICD-10-CM | POA: Diagnosis not present

## 2019-06-07 DIAGNOSIS — F329 Major depressive disorder, single episode, unspecified: Secondary | ICD-10-CM | POA: Diagnosis present

## 2019-06-07 DIAGNOSIS — E669 Obesity, unspecified: Secondary | ICD-10-CM | POA: Diagnosis present

## 2019-06-07 DIAGNOSIS — E876 Hypokalemia: Secondary | ICD-10-CM | POA: Diagnosis not present

## 2019-06-07 DIAGNOSIS — Z79890 Hormone replacement therapy (postmenopausal): Secondary | ICD-10-CM

## 2019-06-07 DIAGNOSIS — J181 Lobar pneumonia, unspecified organism: Secondary | ICD-10-CM | POA: Diagnosis not present

## 2019-06-07 DIAGNOSIS — F902 Attention-deficit hyperactivity disorder, combined type: Secondary | ICD-10-CM | POA: Diagnosis present

## 2019-06-07 DIAGNOSIS — G4733 Obstructive sleep apnea (adult) (pediatric): Secondary | ICD-10-CM | POA: Diagnosis not present

## 2019-06-07 LAB — CBC WITH DIFFERENTIAL/PLATELET
Abs Immature Granulocytes: 0.1 10*3/uL — ABNORMAL HIGH (ref 0.00–0.07)
Basophils Absolute: 0.1 10*3/uL (ref 0.0–0.1)
Basophils Relative: 1 %
Eosinophils Absolute: 0.3 10*3/uL (ref 0.0–0.5)
Eosinophils Relative: 2 %
HCT: 49.2 % (ref 39.0–52.0)
Hemoglobin: 15.2 g/dL (ref 13.0–17.0)
Immature Granulocytes: 1 %
Lymphocytes Relative: 32 %
Lymphs Abs: 4.1 10*3/uL — ABNORMAL HIGH (ref 0.7–4.0)
MCH: 25.9 pg — ABNORMAL LOW (ref 26.0–34.0)
MCHC: 30.9 g/dL (ref 30.0–36.0)
MCV: 83.7 fL (ref 80.0–100.0)
Monocytes Absolute: 0.8 10*3/uL (ref 0.1–1.0)
Monocytes Relative: 6 %
Neutro Abs: 7.5 10*3/uL (ref 1.7–7.7)
Neutrophils Relative %: 58 %
Platelets: 445 10*3/uL — ABNORMAL HIGH (ref 150–400)
RBC: 5.88 MIL/uL — ABNORMAL HIGH (ref 4.22–5.81)
RDW: 15.7 % — ABNORMAL HIGH (ref 11.5–15.5)
WBC: 12.8 10*3/uL — ABNORMAL HIGH (ref 4.0–10.5)
nRBC: 0 % (ref 0.0–0.2)

## 2019-06-07 LAB — URINALYSIS, ROUTINE W REFLEX MICROSCOPIC
Bacteria, UA: NONE SEEN
Bilirubin Urine: NEGATIVE
Glucose, UA: NEGATIVE mg/dL
Hgb urine dipstick: NEGATIVE
Ketones, ur: NEGATIVE mg/dL
Nitrite: NEGATIVE
Protein, ur: 100 mg/dL — AB
Specific Gravity, Urine: 1.021 (ref 1.005–1.030)
pH: 6 (ref 5.0–8.0)

## 2019-06-07 LAB — BASIC METABOLIC PANEL
Anion gap: 9 (ref 5–15)
BUN: 13 mg/dL (ref 6–20)
CO2: 26 mmol/L (ref 22–32)
Calcium: 8.9 mg/dL (ref 8.9–10.3)
Chloride: 105 mmol/L (ref 98–111)
Creatinine, Ser: 1.08 mg/dL (ref 0.61–1.24)
GFR calc Af Amer: >60 mL/min (ref >60)
GFR calc non Af Amer: >60 mL/min (ref >60)
Glucose, Bld: 120 mg/dL — ABNORMAL HIGH (ref 70–99)
Potassium: 3.9 mmol/L (ref 3.5–5.1)
Sodium: 140 mmol/L (ref 135–145)

## 2019-06-07 LAB — CBC
HCT: 48.2 % (ref 39.0–52.0)
Hemoglobin: 14.6 g/dL (ref 13.0–17.0)
MCH: 25.5 pg — ABNORMAL LOW (ref 26.0–34.0)
MCHC: 30.3 g/dL (ref 30.0–36.0)
MCV: 84.1 fL (ref 80.0–100.0)
Platelets: 422 10*3/uL — ABNORMAL HIGH (ref 150–400)
RBC: 5.73 MIL/uL (ref 4.22–5.81)
RDW: 15.8 % — ABNORMAL HIGH (ref 11.5–15.5)
WBC: 15.9 10*3/uL — ABNORMAL HIGH (ref 4.0–10.5)
nRBC: 0 % (ref 0.0–0.2)

## 2019-06-07 LAB — CREATININE, SERUM
Creatinine, Ser: 0.96 mg/dL (ref 0.61–1.24)
GFR calc Af Amer: >60 mL/min (ref >60)
GFR calc non Af Amer: >60 mL/min (ref >60)

## 2019-06-07 LAB — LACTIC ACID, PLASMA
Lactic Acid, Venous: 1.5 mmol/L (ref 0.5–1.9)
Lactic Acid, Venous: 1.5 mmol/L (ref 0.5–1.9)

## 2019-06-07 LAB — SARS CORONAVIRUS 2 BY RT PCR (HOSPITAL ORDER, PERFORMED IN ~~LOC~~ HOSPITAL LAB): SARS Coronavirus 2: NEGATIVE

## 2019-06-07 MED ORDER — SODIUM CHLORIDE 0.9 % IV BOLUS
500.0000 mL | Freq: Once | INTRAVENOUS | Status: AC
  Administered 2019-06-07: 500 mL via INTRAVENOUS

## 2019-06-07 MED ORDER — METHYLPREDNISOLONE SODIUM SUCC 125 MG IJ SOLR
125.0000 mg | Freq: Once | INTRAMUSCULAR | Status: AC
  Administered 2019-06-07: 125 mg via INTRAVENOUS
  Filled 2019-06-07: qty 2

## 2019-06-07 MED ORDER — MAGNESIUM SULFATE 2 GM/50ML IV SOLN
2.0000 g | Freq: Once | INTRAVENOUS | Status: AC
  Administered 2019-06-07: 2 g via INTRAVENOUS
  Filled 2019-06-07: qty 50

## 2019-06-07 MED ORDER — ENOXAPARIN SODIUM 40 MG/0.4ML ~~LOC~~ SOLN
40.0000 mg | SUBCUTANEOUS | Status: DC
  Administered 2019-06-08: 40 mg via SUBCUTANEOUS
  Filled 2019-06-07: qty 0.4

## 2019-06-07 MED ORDER — SODIUM CHLORIDE 0.9 % IV SOLN: 1.0000 g | INTRAVENOUS | Status: DC

## 2019-06-07 MED ORDER — SODIUM CHLORIDE 0.9 % IV SOLN: 500.0000 mg | INTRAVENOUS | Status: DC

## 2019-06-07 MED ORDER — SODIUM CHLORIDE 0.9 % IV SOLN
INTRAVENOUS | Status: DC
  Administered 2019-06-08 (×2): via INTRAVENOUS

## 2019-06-07 MED ORDER — ALBUTEROL SULFATE HFA 108 (90 BASE) MCG/ACT IN AERS
6.0000 | INHALATION_SPRAY | Freq: Once | RESPIRATORY_TRACT | Status: AC
  Administered 2019-06-07: 6 via RESPIRATORY_TRACT
  Filled 2019-06-07: qty 6.7

## 2019-06-07 MED ORDER — SODIUM CHLORIDE 0.9 % IV SOLN
500.0000 mg | Freq: Once | INTRAVENOUS | Status: AC
  Administered 2019-06-07: 500 mg via INTRAVENOUS
  Filled 2019-06-07: qty 500

## 2019-06-07 MED ORDER — SODIUM CHLORIDE 0.9 % IV SOLN
2.0000 g | Freq: Once | INTRAVENOUS | Status: AC
  Administered 2019-06-07: 2 g via INTRAVENOUS
  Filled 2019-06-07: qty 2

## 2019-06-07 NOTE — ED Triage Notes (Signed)
Pt comes in with SOB since last night. Reports chest is hurting worse. Reports O2 was 63% when was seen here in March.

## 2019-06-07 NOTE — Progress Notes (Signed)
A consult was received from an ED physician for cefepime per pharmacy dosing.  The patient's profile has been reviewed for ht/wt/allergies/indication/available labs.   A one time order has been placed for cefepime 2 gm.    Further antibiotics/pharmacy consults should be ordered by admitting physician if indicated.                       Thank you, Eudelia Bunch, Pharm.D [phone] 06/07/2019 5:58 PM

## 2019-06-07 NOTE — ED Notes (Signed)
Pt sats are reading 85%-90% on [place]. Replaced pt back on to NRB at 15 lpm.
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
## 2019-06-07 NOTE — ED Notes (Signed)
Pt placed on nasal canula @ 6 lpm

## 2019-06-07 NOTE — ED Notes (Signed)
Jonelle Sidle, MD notified about bp.

## 2019-06-07 NOTE — ED Notes (Signed)
Pt unable to give urine sample

## 2019-06-07 NOTE — ED Notes (Signed)
ED TO INPATIENT HANDOFF REPORT  Name/Age/Gender Benjamin Rosario 18 y.o. male  Code Status    Code Status Orders  (From admission, onward)         Start     Ordered   06/07/19 2018  Full code  Continuous     06/07/19 2017        Code Status History    Date Active Date Inactive Code Status Order ID Comments User Context   11/25/2018 0930 11/28/2018 1441 Full Code [id number]  Drema Dallas, MD ED   Advance Care Planning Activity      Home/SNF/Other Home  Chief Complaint Shortness of Breath   Level of Care/Admitting Diagnosis ED Disposition    ED Disposition Condition Comment   Admit  Hospital Area: [facility] [[id number]]  Level of Care: Telemetry [5]  Admit to tele based on following criteria: Complex arrhythmia (Bradycardia/Tachycardia)  Covid Evaluation: Asymptomatic Screening Protocol (No Symptoms)  Diagnosis: Acute respiratory failure with hypoxemia [facility]) [[id number]]  Admitting Physician: Rometta Emery [[id number]]  Attending Physician: Rometta Emery [[id number]]  Estimated length of stay: past midnight tomorrow  Certification:: I certify this patient will need inpatient services for at least 2 midnights  PT Class (Do Not Modify): Inpatient [101]  PT Acc Code (Do Not Modify): Private [1]       Medical History Past Medical History:  Diagnosis Date  . ADHD (attention deficit hyperactivity disorder)   . Chlamydia 11/25/2018  . Emphysema of lung (HCC) 11/25/2018  . Environmental allergies   . Hypothyroidism 11/25/2018    Allergies No Known Allergies  IV Location/Drains/Wounds Patient Lines/Drains/Airways Status   Active Line/Drains/Airways    Name:   Placement date:   Placement time:   Site:   Days:   Peripheral IV 06/07/19 Left Antecubital   06/07/19    1647    Antecubital   less than 1          Labs/Imaging Results for orders placed or performed during the hospital encounter of 06/07/19 (from the past 48 hour(s))  CBC with Differential      Status: Abnormal   Collection Time: 06/07/19  4:48 PM  Result Value Ref Range   WBC 12.8 (H) 4.0 - 10.5 K/uL   RBC 5.88 (H) 4.22 - 5.81 MIL/uL   Hemoglobin 15.2 13.0 - 17.0 g/dL   HCT 86.5 78.4 - 69.6 %   MCV 83.7 80.0 - 100.0 fL   MCH 25.9 (L) 26.0 - 34.0 pg   MCHC 30.9 30.0 - 36.0 g/dL   RDW 29.5 (H) 28.4 - 13.2 %   Platelets 445 (H) 150 - 400 K/uL   nRBC 0.0 0.0 - 0.2 %   Neutrophils Relative % 58 %   Neutro Abs 7.5 1.7 - 7.7 K/uL   Lymphocytes Relative 32 %   Lymphs Abs 4.1 (H) 0.7 - 4.0 K/uL   Monocytes Relative 6 %   Monocytes Absolute 0.8 0.1 - 1.0 K/uL   Eosinophils Relative 2 %   Eosinophils Absolute 0.3 0.0 - 0.5 K/uL   Basophils Relative 1 %   Basophils Absolute 0.1 0.0 - 0.1 K/uL   Immature Granulocytes 1 %   Abs Immature Granulocytes 0.10 (H) 0.00 - 0.07 K/uL    Comment: Performed at [facility], [address], [place], [place] [address]  Basic metabolic panel     Status: Abnormal   Collection Time: 06/07/19  4:48 PM  Result Value Ref Range   Sodium 140  135 - 145 mmol/L   Potassium 3.9 3.5 - 5.1 mmol/L   Chloride 105 98 - 111 mmol/L   CO2 26 22 - 32 mmol/L   Glucose, Bld 120 (H) 70 - 99 mg/dL   BUN 13 6 - 20 mg/dL   Creatinine, Ser 1.08 0.61 - 1.24 mg/dL   Calcium 8.9 8.9 - 10.3 mg/dL   GFR calc non Af Amer >60 >60 mL/min   GFR calc Af Amer >60 >60 mL/min   Anion gap 9 5 - 15    Comment: Performed at [facility], [place] [address], [place], [place] [address]  SARS Coronavirus 2 [facility] order, Performed in [facility] hospital lab) Nasopharyngeal Nasopharyngeal Swab     Status: None   Collection Time: 06/07/19  5:00 PM   Specimen: Nasopharyngeal Swab  Result Value Ref Range   SARS Coronavirus 2 NEGATIVE NEGATIVE    Comment: (NOTE) If result is NEGATIVE SARS-CoV-2 target nucleic acids are NOT DETECTED. The SARS-CoV-2 RNA is generally detectable in upper and lower  respiratory specimens during the acute phase of  infection. The lowest  concentration of SARS-CoV-2 viral copies this assay can detect is 250  copies / mL. A negative result does not preclude SARS-CoV-2 infection  and should not be used as the sole basis for treatment or other  patient management decisions.  A negative result may occur with  improper specimen collection / handling, submission of specimen other  than nasopharyngeal swab, presence of viral mutation(s) within the  areas targeted by this assay, and inadequate number of viral copies  (<250 copies / mL). A negative result must be combined with clinical  observations, patient history, and epidemiological information. If result is POSITIVE SARS-CoV-2 target nucleic acids are DETECTED. The SARS-CoV-2 RNA is generally detectable in upper and lower  respiratory specimens dur ing the acute phase of infection.  Positive  results are indicative of active infection with SARS-CoV-2.  Clinical  correlation with patient history and other diagnostic information is  necessary to determine patient infection status.  Positive results do  not rule out bacterial infection or co-infection with other viruses. If result is PRESUMPTIVE POSTIVE SARS-CoV-2 nucleic acids MAY BE PRESENT.   A presumptive positive result was obtained on the submitted specimen  and confirmed on repeat testing.  While 2019 novel coronavirus  (SARS-CoV-2) nucleic acids may be present in the submitted sample  additional confirmatory testing may be necessary for epidemiological  and / or clinical management purposes  to differentiate between  SARS-CoV-2 and other Sarbecovirus currently known to infect humans.  If clinically indicated additional testing with an alternate test  methodology [phone]) is advised. The SARS-CoV-2 RNA is generally  detectable in upper and lower respiratory sp ecimens during the acute  phase of infection. The expected result is Negative. Fact Sheet for Patients:   StrictlyIdeas.no Fact Sheet for Healthcare Providers: BankingDealers.co.za This test is not yet approved or cleared by the [place] FDA and has been authorized for detection and/or diagnosis of SARS-CoV-2 by FDA under an Emergency Use Authorization (EUA).  This EUA will remain in effect (meaning this test can be used) for the duration of the COVID-19 declaration under Section 564(b)(1) of the Act, 21 U.S.C. section 360bbb-3(b)(1), unless the authorization is terminated or revoked sooner. Performed at [facility], [place] [address], [place], [place] [address]   Lactic acid, plasma     Status: None   Collection Time: 06/07/19  5:54 PM  Result Value  Ref Range   Lactic Acid, Venous 1.5 0.5 - 1.9 mmol/L    Comment: Performed at [facility], [address] [place], [place] [address]  Blood Culture (routine x 2)     Status: None (Preliminary result)   Collection Time: 06/07/19  5:55 PM   Specimen: BLOOD LEFT ARM  Result Value Ref Range   Specimen Description      BLOOD LEFT ARM Performed at [facility], [address] [place], [place] [address]    Special Requests      BOTTLES DRAWN AEROBIC AND ANAEROBIC Blood Culture adequate volume Performed at [facility], [address] [place], [place] [address]    Culture PENDING    Report Status PENDING   Lactic acid, plasma     Status: None   Collection Time: 06/07/19  7:36 PM  Result Value Ref Range   Lactic Acid, Venous 1.5 0.5 - 1.9 mmol/L    Comment: Performed at [facility], [address] [place], [place] [address]  CBC     Status: Abnormal   Collection Time: 06/07/19  8:42 PM  Result Value Ref Range   WBC 15.9 (H) 4.0 - 10.5 K/uL   RBC 5.73 4.22 - 5.81 MIL/uL   Hemoglobin 14.6 13.0 - 17.0 g/dL   HCT 60.148.2 09.339.0 - 23.552.0 %   MCV 84.1 80.0 - 100.0 fL   MCH 25.5 (L) 26.0 - 34.0 pg   MCHC 30.3 30.0 -  36.0 g/dL   RDW 57.315.8 (H) 22.011.5 - 25.415.5 %   Platelets 422 (H) 150 - 400 K/uL   nRBC 0.0 0.0 - 0.2 %    Comment: Performed at [facility], [address] [place], [place] [address]  Creatinine, serum     Status: None   Collection Time: 06/07/19  8:42 PM  Result Value Ref Range   Creatinine, Ser 0.96 0.61 - 1.24 mg/dL   GFR calc non Af Amer >60 >60 mL/min   GFR calc Af Amer >60 >60 mL/min    Comment: Performed at [facility], [address] [place], [place] [address]   Dg Chest Port 1 View  Result Date: 06/07/2019 CLINICAL DATA:  Shortness of breath EXAM: PORTABLE CHEST 1 VIEW COMPARISON:  Jan 25, 2019 FINDINGS: No pneumothorax. The right lung is clear. There is opacity in the left mid lower lung. The cardiomediastinal silhouette is stable with cardiomegaly. No pneumothorax. IMPRESSION: Infiltrate in left base worrisome for pneumonia. Atypical edema considered less likely. Recommend follow-up imaging to ensure complete resolution. No other changes. Electronically Signed   By: Gerome Samavid  Williams III M.D   On: 06/07/2019 17:46    Pending Labs Unresulted Labs (From admission, onward)    Start     Ordered   06/14/19 0500  Creatinine, serum  (enoxaparin (LOVENOX)    CrCl >/= 30 ml/min)  Weekly,   R    Comments: while on enoxaparin therapy    06/07/19 2017   06/08/19 0500  Comprehensive metabolic panel  Tomorrow morning,   R     06/07/19 2017   06/08/19 0500  CBC  Tomorrow morning,   R     06/07/19 2017   06/07/19 2018  HIV antibody (Routine Screening)  Once,   STAT     06/07/19 2017   06/07/19 2018  Strep pneumoniae urinary antigen  Once,   STAT     06/07/19 2017   06/07/19 1754  Blood  Culture (routine x 2)  BLOOD CULTURE X 2,   STAT     06/07/19 1754   06/07/19 1754  Urinalysis, Routine w reflex microscopic  ONCE - STAT,   STAT     06/07/19 1754   06/07/19 1754  Urine culture  ONCE - STAT,   STAT     06/07/19 1754          Vitals/Pain Today's  Vitals   06/07/19 1805 06/07/19 1815 06/07/19 1827 06/07/19 1900  BP:   (!) 159/107 (!) 184/100  Pulse: (!) 110 (!) 108 (!) 108 (!) 107  Resp:  Temp:      TempSrc:      SpO2: (!) 88% 98% 97% 100%  Weight:      Height:        Isolation Precautions No active isolations  Medications Medications  azithromycin (ZITHROMAX) 500 mg in sodium chloride 0.9 % 250 mL IVPB (500 mg Intravenous New Bag/Given 06/07/19 2041)  0.9 %  sodium chloride infusion (has no administration in time range)  enoxaparin (LOVENOX) injection 40 mg (has no administration in time range)  cefTRIAXone (ROCEPHIN) 1 g in sodium chloride 0.9 % 100 mL IVPB (has no administration in time range)  azithromycin (ZITHROMAX) 500 mg in sodium chloride 0.9 % 250 mL IVPB (has no administration in time range)  albuterol (VENTOLIN HFA) 108 (90 Base) MCG/ACT inhaler 6 puff (6 puffs Inhalation Given 06/07/19 1701)  methylPREDNISolone sodium succinate (SOLU-MEDROL) 125 mg/2 mL injection 125 mg (125 mg Intravenous Given 06/07/19 1656)  magnesium sulfate IVPB 2 g 50 mL (0 g Intravenous Stopped 06/07/19 1756)  ceFEPIme (MAXIPIME) 2 g in sodium chloride 0.9 % 100 mL IVPB (0 g Intravenous Stopped 06/07/19 2004)  sodium chloride 0.9 % bolus 500 mL (0 mLs Intravenous Stopped 06/07/19 2111)    Mobility walks with person assist

## 2019-06-07 NOTE — ED Notes (Signed)
Lab contacted to add on HIV antibody blood work.

## 2019-06-08 ENCOUNTER — Other Ambulatory Visit: Payer: Self-pay

## 2019-06-08 DIAGNOSIS — J189 Pneumonia, unspecified organism: Principal | ICD-10-CM

## 2019-06-08 DIAGNOSIS — F321 Major depressive disorder, single episode, moderate: Secondary | ICD-10-CM

## 2019-06-08 DIAGNOSIS — F902 Attention-deficit hyperactivity disorder, combined type: Secondary | ICD-10-CM

## 2019-06-08 LAB — CBC
HCT: 47.7 % (ref 39.0–52.0)
Hemoglobin: 14.4 g/dL (ref 13.0–17.0)
MCH: 25.6 pg — ABNORMAL LOW (ref 26.0–34.0)
MCHC: 30.2 g/dL (ref 30.0–36.0)
MCV: 84.7 fL (ref 80.0–100.0)
Platelets: 423 10*3/uL — ABNORMAL HIGH (ref 150–400)
RBC: 5.63 MIL/uL (ref 4.22–5.81)
RDW: 15.9 % — ABNORMAL HIGH (ref 11.5–15.5)
WBC: 15.5 10*3/uL — ABNORMAL HIGH (ref 4.0–10.5)
nRBC: 0 % (ref 0.0–0.2)

## 2019-06-08 LAB — COMPREHENSIVE METABOLIC PANEL
ALT: 30 U/L (ref 0–44)
AST: 22 U/L (ref 15–41)
Albumin: 3.7 g/dL (ref 3.5–5.0)
Alkaline Phosphatase: 63 U/L (ref 38–126)
Anion gap: 12 (ref 5–15)
BUN: 14 mg/dL (ref 6–20)
CO2: 19 mmol/L — ABNORMAL LOW (ref 22–32)
Calcium: 8.8 mg/dL — ABNORMAL LOW (ref 8.9–10.3)
Chloride: 104 mmol/L (ref 98–111)
Creatinine, Ser: 0.84 mg/dL (ref 0.61–1.24)
GFR calc Af Amer: >60 mL/min (ref >60)
GFR calc non Af Amer: >60 mL/min (ref >60)
Glucose, Bld: 238 mg/dL — ABNORMAL HIGH (ref 70–99)
Potassium: 5.1 mmol/L (ref 3.5–5.1)
Sodium: 135 mmol/L (ref 135–145)
Total Bilirubin: 0.6 mg/dL (ref 0.3–1.2)
Total Protein: 7.5 g/dL (ref 6.5–8.1)

## 2019-06-08 LAB — GLUCOSE, CAPILLARY
Glucose-Capillary: 191 mg/dL — ABNORMAL HIGH (ref 70–99)
Glucose-Capillary: 257 mg/dL — ABNORMAL HIGH (ref 70–99)
Glucose-Capillary: 334 mg/dL — ABNORMAL HIGH (ref 70–99)

## 2019-06-08 LAB — HEMOGLOBIN A1C
Hgb A1c MFr Bld: 6.5 % — ABNORMAL HIGH (ref 4.8–5.6)
Mean Plasma Glucose: 139.85 mg/dL

## 2019-06-08 LAB — BRAIN NATRIURETIC PEPTIDE: B Natriuretic Peptide: 87.4 pg/mL (ref 0.0–100.0)

## 2019-06-08 LAB — TSH: TSH: 1.608 u[IU]/mL (ref 0.350–4.500)

## 2019-06-08 LAB — T4, FREE: Free T4: 0.87 ng/dL (ref 0.61–1.12)

## 2019-06-08 LAB — HIV ANTIBODY (ROUTINE TESTING W REFLEX): HIV Screen 4th Generation wRfx: NONREACTIVE

## 2019-06-08 MED ORDER — IPRATROPIUM-ALBUTEROL 0.5-2.5 (3) MG/3ML IN SOLN
3.0000 mL | RESPIRATORY_TRACT | Status: DC
  Administered 2019-06-08: 3 mL via RESPIRATORY_TRACT
  Filled 2019-06-08: qty 3

## 2019-06-08 MED ORDER — MOMETASONE FURO-FORMOTEROL FUM 100-5 MCG/ACT IN AERO
2.0000 | INHALATION_SPRAY | Freq: Two times a day (BID) | RESPIRATORY_TRACT | Status: DC
  Administered 2019-06-08 – 2019-06-09 (×2): 2 via RESPIRATORY_TRACT
  Filled 2019-06-08: qty 8.8

## 2019-06-08 MED ORDER — IPRATROPIUM-ALBUTEROL 0.5-2.5 (3) MG/3ML IN SOLN
3.0000 mL | Freq: Four times a day (QID) | RESPIRATORY_TRACT | Status: DC
  Administered 2019-06-08 – 2019-06-09 (×3): 3 mL via RESPIRATORY_TRACT
  Filled 2019-06-08 (×3): qty 3

## 2019-06-08 MED ORDER — SODIUM CHLORIDE 0.9 % IV SOLN
1.0000 g | INTRAVENOUS | Status: DC
  Administered 2019-06-08 – 2019-06-09 (×2): 1 g via INTRAVENOUS
  Filled 2019-06-08 (×2): qty 1

## 2019-06-08 MED ORDER — INSULIN ASPART 100 UNIT/ML ~~LOC~~ SOLN
0.0000 [IU] | Freq: Every day | SUBCUTANEOUS | Status: DC
  Administered 2019-06-08: 4 [IU] via SUBCUTANEOUS

## 2019-06-08 MED ORDER — ACETAMINOPHEN 325 MG PO TABS
650.0000 mg | ORAL_TABLET | ORAL | Status: DC | PRN
  Administered 2019-06-08: 650 mg via ORAL
  Filled 2019-06-08: qty 2

## 2019-06-08 MED ORDER — SODIUM CHLORIDE 0.9 % IV SOLN
500.0000 mg | INTRAVENOUS | Status: DC
  Administered 2019-06-09: 500 mg via INTRAVENOUS
  Filled 2019-06-08 (×2): qty 500

## 2019-06-08 MED ORDER — ZOLPIDEM TARTRATE 5 MG PO TABS
5.0000 mg | ORAL_TABLET | Freq: Once | ORAL | Status: AC
  Administered 2019-06-08: 5 mg via ORAL
  Filled 2019-06-08: qty 1

## 2019-06-08 MED ORDER — FUROSEMIDE 10 MG/ML IJ SOLN
40.0000 mg | Freq: Once | INTRAMUSCULAR | Status: AC
  Administered 2019-06-08: 40 mg via INTRAVENOUS
  Filled 2019-06-08: qty 4

## 2019-06-08 MED ORDER — LEVALBUTEROL HCL 1.25 MG/0.5ML IN NEBU
1.2500 mg | INHALATION_SOLUTION | Freq: Once | RESPIRATORY_TRACT | Status: AC
  Administered 2019-06-08: 1.25 mg via RESPIRATORY_TRACT
  Filled 2019-06-08: qty 0.5

## 2019-06-08 MED ORDER — LEVOTHYROXINE SODIUM 75 MCG PO TABS
75.0000 ug | ORAL_TABLET | Freq: Every day | ORAL | Status: DC
  Administered 2019-06-09: 75 ug via ORAL
  Filled 2019-06-08: qty 1
  Filled 2019-06-08: qty 3

## 2019-06-08 MED ORDER — INSULIN ASPART 100 UNIT/ML ~~LOC~~ SOLN
0.0000 [IU] | Freq: Three times a day (TID) | SUBCUTANEOUS | Status: DC
  Administered 2019-06-08: 8 [IU] via SUBCUTANEOUS
  Administered 2019-06-09: 3 [IU] via SUBCUTANEOUS

## 2019-06-08 NOTE — Plan of Care (Signed)
  Problem: Education: Goal: Knowledge of General Education information will improve Description: Including pain rating scale, medication(s)/side effects and non-pharmacologic comfort measures Outcome: Completed/Met   Problem: Clinical Measurements: Goal: Ability to maintain clinical measurements within normal limits will improve Outcome: Progressing Goal: Will remain free from infection Outcome: Progressing Goal: Diagnostic test results will improve Outcome: Progressing Goal: Respiratory complications will improve Outcome: Progressing Goal: Cardiovascular complication will be avoided Outcome: Completed/Met   Problem: Activity: Goal: Risk for activity intolerance will decrease Outcome: Completed/Met   Problem: Nutrition: Goal: Adequate nutrition will be maintained Outcome: Completed/Met   Problem: Coping: Goal: Level of anxiety will decrease Outcome: Completed/Met   Problem: Elimination: Goal: Will not experience complications related to bowel motility Outcome: Completed/Met Goal: Will not experience complications related to urinary retention Outcome: Completed/Met   Problem: Pain Managment: Goal: General experience of comfort will improve Outcome: Completed/Met   Problem: Safety: Goal: Ability to remain free from injury will improve Outcome: Progressing   Problem: Skin Integrity: Goal: Risk for impaired skin integrity will decrease Outcome: Completed/Met   Problem: Activity: Goal: Ability to tolerate increased activity will improve Outcome: Completed/Met   Problem: Clinical Measurements: Goal: Ability to maintain a body temperature in the normal range will improve Outcome: Completed/Met   Problem: Respiratory: Goal: Ability to maintain adequate ventilation will improve Outcome: Progressing Goal: Ability to maintain a clear airway will improve Outcome: Progressing

## 2019-06-08 NOTE — Progress Notes (Signed)
[facility]                                                                              Patient Demographics  Demetrios Byron, is a 18 y.o. male, DOB - Mar 27, 2001, MWU:[id number]  Admit date - 06/07/2019   Admitting Physician Rometta Emery, MD  Outpatient Primary MD for the patient is Tracey Harries, MD  Outpatient specialists:   LOS - 1  days   Medical records reviewed and are as summarized below:    Chief Complaint  Patient presents with  . Shortness of Breath       Brief summary   Patient is a 18 year old male with history of ADHD, hypothyroidism, asthma/COPD, morbid obesity presented with shortness of breath and coughing.  Patient reported symptoms started the day before and has gotten worse.  Patient was recently seen by pulmonology for excessive snoring and was scheduled for home sleep study.  Patient used to be a smoker, used THC but not anymore COVID-19 test negative.  Chest x-ray showed left-sided pneumonia.  Patient was found to have significant wheezing with shortness of breath in ED.  He also reported left-sided pleuritic chest pain. Patient was admitted for further work-up.   Assessment & Plan    Principal Problem:   Acute respiratory failure with hypoxemia [facility]): Multifactorial, secondary to left lung pneumonia, OSA, obesity hypoventilation, COPD -Overnight events noted, patient easily desatted tachypneic, respiratory went up to 48 while sleeping and was placed on Venturi mask at 15 L.  Subsequently placed on CPAP. -This morning, during my examination, on removing the CPAP, as patient still hypoxic, O2 sats 84 to 91% on room air.  Will need home O2 evaluation prior to discharge and possibly CPAP nightly for home. -COVID-19 test negative.  Follow urine strep antigen - will check BNP, DC IV fluids.  2D echo 11/2018 had shown EF of 60 to 65%, moderately reduced right ventricular systolic function, left ventricular diastolic Doppler parameters  consistent with impaired relaxation.   -Lasix 40 mg IV x1  -Placed on scheduled duo nebs, Dulera, flutter valve  Active Problems: Hypothyroidism -TSH 8.1 in 11/2018,  recheck TSH, free T4 -Patient not taking his Synthroid, restart  Diabetes mellitus, type II -Not on any medications -Hemoglobin A1c 6.3, CBGs in 200s, likely due to IV Solu-Medrol given in ED -Recheck hemoglobin A1c, will place on sliding scale insulin  Morbid obesity -BMI 57 -Patient counseled on diet and weight control, lifestyle changes  ADHD -Not on any meds  History of asthma however also has emphysema on the recent CT -Placed on scheduled duo nebs, Dulera, flutter valve  OSA, obesity hypoventilation syndrome Continue CPAP at night, patient following pulmonology outpatient for sleep study  Code Status: Full CODE STATUS DVT Prophylaxis:  Lovenox  Family Communication: Discussed all imaging results, lab results, explained to the patient   Disposition Plan: Still hypoxic, O2 sats down to low 80s on removing the CPAP  Time Spent in minutes 35 minutes  Procedures:  None  Consultants:   None  Antimicrobials:   Anti-infectives (From admission, onward)   Start     Dose/Rate Route Frequency Ordered Stop  06/08/19 2100  azithromycin (ZITHROMAX) 500 mg in sodium chloride 0.9 % 250 mL IVPB     500 mg 250 mL/hr over 60 Minutes Intravenous Every 24 hours 06/08/19 0022     06/08/19 0400  cefTRIAXone (ROCEPHIN) 1 g in sodium chloride 0.9 % 100 mL IVPB     1 g 200 mL/hr over 30 Minutes Intravenous Every 24 hours 06/08/19 0021     06/07/19 2030  cefTRIAXone (ROCEPHIN) 1 g in sodium chloride 0.9 % 100 mL IVPB  Status:  Discontinued     1 g 200 mL/hr over 30 Minutes Intravenous Every 24 hours 06/07/19 2017 06/08/19 0021   06/07/19 2030  azithromycin (ZITHROMAX) 500 mg in sodium chloride 0.9 % 250 mL IVPB  Status:  Discontinued     500 mg 250 mL/hr over 60 Minutes Intravenous Every 24 hours 06/07/19 2017  06/08/19 0022   06/07/19 1800  ceFEPIme (MAXIPIME) 2 g in sodium chloride 0.9 % 100 mL IVPB     2 g 200 mL/hr over 30 Minutes Intravenous  Once 06/07/19 1754 06/07/19 2004   06/07/19 1800  azithromycin (ZITHROMAX) 500 mg in sodium chloride 0.9 % 250 mL IVPB     500 mg 250 mL/hr over 60 Minutes Intravenous  Once 06/07/19 1754 06/07/19 2239          Medications  Scheduled Meds: . enoxaparin (LOVENOX) injection  40 mg Subcutaneous Q24H   Continuous Infusions: . sodium chloride 100 mL/hr at 06/08/19 1116  . azithromycin    . cefTRIAXone (ROCEPHIN)  IV Stopped (06/08/19 0427)   PRN Meds:.acetaminophen      Subjective:   Stephenson Cichy was seen and examined today.  At the time of my examination, still desaturating to low 80s on room air after removing the CPAP.  No chest pain, dizziness or lightheadedness.  Denies any abdominal pain.  Overnight events noted.  No fevers.  Objective:   Vitals:   06/08/19 0822 06/08/19 0930 06/08/19 0935 06/08/19 1043  BP:      Pulse: 98     Resp: (!) 45   (!) 28  Temp:      TempSrc:      SpO2: (!) 86% (!) 82% 92%   Weight:      Height:        Intake/Output Summary (Last 24 hours) at 06/08/2019 1250 Last data filed at 06/08/2019 1020 Gross per 24 hour  Intake 1258.04 ml  Output 500 ml  Net 758.04 ml     Wt Readings from Last 3 Encounters:  06/08/19 (!) 198.5 kg (>99 %, Z= 4.14)*  06/04/19 (!) 195.7 kg (>99 %, Z= 4.10)*  11/28/18 (!) 185.2 kg (>99 %, Z= 3.94)*   * Growth percentiles are based on CDC (Boys, 2-20 Years) data.     Exam  General: Alert and oriented x 3, NAD  Eyes:   HEENT:  Atraumatic, normocephalic,   Cardiovascular: S1 S2 auscultated, no murmurs, RRR  Respiratory: Diminished breath sounds at the bases  Gastrointestinal: Morbid obesity, soft, NT, ND, + bowel sounds  Ext: no pedal edema bilaterally  Neuro: No new deficits   musculoskeletal: No digital cyanosis, clubbing  Skin: No rashes  Psych:  Normal affect and demeanor, alert and oriented x3    Data Reviewed:  I have personally reviewed following labs and imaging studies  Micro Results Recent Results (from the past 240 hour(s))  SARS Coronavirus 2 [facility] order, Performed in [facility] hospital lab) Nasopharyngeal Nasopharyngeal Swab  Status: None   Collection Time: 06/07/19  5:00 PM   Specimen: Nasopharyngeal Swab  Result Value Ref Range Status   SARS Coronavirus 2 NEGATIVE NEGATIVE Final    Comment: (NOTE) If result is NEGATIVE SARS-CoV-2 target nucleic acids are NOT DETECTED. The SARS-CoV-2 RNA is generally detectable in upper and lower  respiratory specimens during the acute phase of infection. The lowest  concentration of SARS-CoV-2 viral copies this assay can detect is 250  copies / mL. A negative result does not preclude SARS-CoV-2 infection  and should not be used as the sole basis for treatment or other  patient management decisions.  A negative result may occur with  improper specimen collection / handling, submission of specimen other  than nasopharyngeal swab, presence of viral mutation(s) within the  areas targeted by this assay, and inadequate number of viral copies  (<250 copies / mL). A negative result must be combined with clinical  observations, patient history, and epidemiological information. If result is POSITIVE SARS-CoV-2 target nucleic acids are DETECTED. The SARS-CoV-2 RNA is generally detectable in upper and lower  respiratory specimens dur ing the acute phase of infection.  Positive  results are indicative of active infection with SARS-CoV-2.  Clinical  correlation with patient history and other diagnostic information is  necessary to determine patient infection status.  Positive results do  not rule out bacterial infection or co-infection with other viruses. If result is PRESUMPTIVE POSTIVE SARS-CoV-2 nucleic acids MAY BE PRESENT.   A presumptive positive result was obtained on the  submitted specimen  and confirmed on repeat testing.  While 2019 novel coronavirus  (SARS-CoV-2) nucleic acids may be present in the submitted sample  additional confirmatory testing may be necessary for epidemiological  and / or clinical management purposes  to differentiate between  SARS-CoV-2 and other Sarbecovirus currently known to infect humans.  If clinically indicated additional testing with an alternate test  methodology [id number]) is advised. The SARS-CoV-2 RNA is generally  detectable in upper and lower respiratory sp ecimens during the acute  phase of infection. The expected result is Negative. Fact Sheet for Patients:  BoilerBrush.com.cyhttps://www.fda.gov/media/136312/download Fact Sheet for Healthcare Providers: https://pope.com/https://www.fda.gov/media/136313/download This test is not yet approved or cleared by the [place] FDA and has been authorized for detection and/or diagnosis of SARS-CoV-2 by FDA under an Emergency Use Authorization (EUA).  This EUA will remain in effect (meaning this test can be used) for the duration of the COVID-19 declaration under Section 564(b)(1) of the Act, 21 U.S.C. section 360bbb-3(b)(1), unless the authorization is terminated or revoked sooner. Performed at [facility], [address] [place], [place] [address]   Blood Culture (routine x 2)     Status: None (Preliminary result)   Collection Time: 06/07/19  5:55 PM   Specimen: BLOOD LEFT ARM  Result Value Ref Range Status   Specimen Description   Final    BLOOD LEFT ARM Performed at [facility], [address] [place], [place] [address]    Special Requests   Final    BOTTLES DRAWN AEROBIC AND ANAEROBIC Blood Culture adequate volume Performed at [facility], [address] [place], [place] [address]    Culture   Final    NO GROWTH < 24 HOURS Performed at [facility], [address] [place], [place] [address]    Report Status PENDING  Incomplete   Blood Culture (routine x 2)     Status: None (Preliminary result)  Collection Time: 06/07/19  7:37 PM   Specimen: BLOOD  Result Value Ref Range Status   Specimen Description   Final    BLOOD RIGHT ANTECUBITAL Performed at [facility], [address], [place], [place] [address]    Special Requests   Final    BOTTLES DRAWN AEROBIC AND ANAEROBIC Blood Culture adequate volume Performed at [facility], [address] [place], [place] [address]    Culture   Final    NO GROWTH < 24 HOURS Performed at [facility], [address], [place], [place] [address]    Report Status PENDING  Incomplete    Radiology Reports Dg Chest Port 1 View  Result Date: 06/07/2019 CLINICAL DATA:  Shortness of breath EXAM: PORTABLE CHEST 1 VIEW COMPARISON:  Jan 25, 2019 FINDINGS: No pneumothorax. The right lung is clear. There is opacity in the left mid lower lung. The cardiomediastinal silhouette is stable with cardiomegaly. No pneumothorax. IMPRESSION: Infiltrate in left base worrisome for pneumonia. Atypical edema considered less likely. Recommend follow-up imaging to ensure complete resolution. No other changes. Electronically Signed   By: Gerome Sam III M.D   On: 06/07/2019 17:46    Lab Data:  CBC: Recent Labs  Lab 06/07/19 1648 06/07/19 2042 06/08/19 0717  WBC 12.8* 15.9* 15.5*  NEUTROABS 7.5  --   --   HGB 15.2 14.6 14.4  HCT 49.2 48.2 47.7  MCV 83.7 84.1 84.7  PLT 445* 422* 423*   Basic Metabolic Panel: Recent Labs  Lab 06/07/19 1648 06/07/19 2042 06/08/19 0717  NA 140  --  135  K 3.9  --  5.1  CL 105  --  104  CO2 26  --  19*  GLUCOSE 120*  --  238*  BUN 13  --  14  CREATININE 1.08 0.96 0.84  CALCIUM 8.9  --  8.8*   GFR: Estimated Creatinine Clearance: 254.7 mL/min (by C-G formula based on SCr of 0.84 mg/dL). Liver Function Tests: Recent Labs  Lab 06/08/19 0717  AST 22  ALT 30  ALKPHOS 63  BILITOT 0.6  PROT 7.5  ALBUMIN  3.7   No results for input(s): LIPASE, AMYLASE in the last 168 hours. No results for input(s): AMMONIA in the last 168 hours. Coagulation Profile: No results for input(s): INR, PROTIME in the last 168 hours. Cardiac Enzymes: No results for input(s): CKTOTAL, CKMB, CKMBINDEX, TROPONINI in the last 168 hours. BNP (last 3 results) No results for input(s): PROBNP in the last 8760 hours. HbA1C: No results for input(s): HGBA1C in the last 72 hours. CBG: Recent Labs  Lab 06/08/19 0801  GLUCAP 191*   Lipid Profile: No results for input(s): CHOL, HDL, LDLCALC, TRIG, CHOLHDL, LDLDIRECT in the last 72 hours. Thyroid Function Tests: No results for input(s): TSH, T4TOTAL, FREET4, T3FREE, THYROIDAB in the last 72 hours. Anemia Panel: No results for input(s): VITAMINB12, FOLATE, FERRITIN, TIBC, IRON, RETICCTPCT in the last 72 hours. Urine analysis:    Component Value Date/Time   COLORURINE YELLOW 06/07/2019 1754   APPEARANCEUR CLEAR 06/07/2019 1754   LABSPEC 1.021 06/07/2019 1754   PHURINE 6.0 06/07/2019 1754   GLUCOSEU NEGATIVE 06/07/2019 1754   HGBUR NEGATIVE 06/07/2019 1754   BILIRUBINUR NEGATIVE 06/07/2019 1754   KETONESUR NEGATIVE 06/07/2019 1754   PROTEINUR 100 (A) 06/07/2019 1754   UROBILINOGEN 0.2 06/09/2013 0731   NITRITE NEGATIVE 06/07/2019 1754   LEUKOCYTESUR TRACE (A) 06/07/2019 1754       M.D. [facility]  06/08/2019, 12:50 PM  Pager: [phone] Between 7am to 7pm - call Pager - [phone]  After 7pm go to [email or web address] - password [email or web address]  Call night coverage person covering after 7pm

## 2019-06-08 NOTE — Plan of Care (Addendum)
Pt denies pain. He had a good appetite this shift and had a BM. Pt resp. went up to 48 while sleeping. He was using a Venturi mask @ 15 LPM. On-call contacted and an order for a breathing Tx was received. RT and RR contacted to assess pt. RR recommended CPAP. Pt put on CPAP by RT. When pt is awake, his Resp are between 22-26, however, when pt dozes off his resp. immediately increase into the 40's, even on CPAP @ 10 LPM. Pt O2 sat has stayed mostly above 90% with a HR of 100-104. Pt is A&O X4. His MEWS will stay in the RED, but only d/t his resp.  Pt does NOT appear to be in distress. On-call K. Schorr is aware of his condition

## 2019-06-09 ENCOUNTER — Telehealth: Payer: Self-pay | Admitting: Pulmonary Disease

## 2019-06-09 LAB — CBC
HCT: 47.8 % (ref 39.0–52.0)
Hemoglobin: 14.4 g/dL (ref 13.0–17.0)
MCH: 25.7 pg — ABNORMAL LOW (ref 26.0–34.0)
MCHC: 30.1 g/dL (ref 30.0–36.0)
MCV: 85.2 fL (ref 80.0–100.0)
Platelets: 438 10*3/uL — ABNORMAL HIGH (ref 150–400)
RBC: 5.61 MIL/uL (ref 4.22–5.81)
RDW: 16.6 % — ABNORMAL HIGH (ref 11.5–15.5)
WBC: 15.3 10*3/uL — ABNORMAL HIGH (ref 4.0–10.5)
nRBC: 0 % (ref 0.0–0.2)

## 2019-06-09 LAB — BASIC METABOLIC PANEL
Anion gap: 9 (ref 5–15)
BUN: 18 mg/dL (ref 6–20)
CO2: 25 mmol/L (ref 22–32)
Calcium: 8.6 mg/dL — ABNORMAL LOW (ref 8.9–10.3)
Chloride: 105 mmol/L (ref 98–111)
Creatinine, Ser: 0.84 mg/dL (ref 0.61–1.24)
GFR calc Af Amer: >60 mL/min (ref >60)
GFR calc non Af Amer: >60 mL/min (ref >60)
Glucose, Bld: 151 mg/dL — ABNORMAL HIGH (ref 70–99)
Potassium: 4.3 mmol/L (ref 3.5–5.1)
Sodium: 139 mmol/L (ref 135–145)

## 2019-06-09 LAB — T3: T3, Total: 91 ng/dL (ref 71–180)

## 2019-06-09 LAB — GLUCOSE, CAPILLARY
Glucose-Capillary: 84 mg/dL (ref 70–99)
Glucose-Capillary: 164 mg/dL — ABNORMAL HIGH (ref 70–99)

## 2019-06-09 LAB — STREP PNEUMONIAE URINARY ANTIGEN: Strep Pneumo Urinary Antigen: NEGATIVE

## 2019-06-09 MED ORDER — LEVOTHYROXINE SODIUM 75 MCG PO TABS: 75.0000 ug | ORAL_TABLET | Freq: Every day | ORAL | 3 refills | Status: DC

## 2019-06-09 MED ORDER — MOMETASONE FURO-FORMOTEROL FUM 100-5 MCG/ACT IN AERO: 2.0000 | INHALATION_SPRAY | Freq: Two times a day (BID) | RESPIRATORY_TRACT | 4 refills | Status: AC

## 2019-06-09 MED ORDER — CEFPODOXIME PROXETIL 200 MG PO TABS: 200.0000 mg | ORAL_TABLET | Freq: Two times a day (BID) | ORAL | 0 refills | Status: DC

## 2019-06-09 MED ORDER — AZITHROMYCIN 500 MG PO TABS: 500.0000 mg | ORAL_TABLET | Freq: Every day | ORAL | 0 refills | Status: DC

## 2019-06-09 NOTE — ED Provider Notes (Signed)
[facility] Provider Note   CSN: [id number] Arrival date & time: 06/07/19  1633     History   Chief Complaint Chief Complaint  Patient presents with  . Shortness of Breath    HPI Benjamin Rosario is a 18 y.o. male.     HPI  18 y/o with hx of emphysematous lungs comes in with cc of shortness of breath. He had sudden onset of DIB at noon. He has taken multiple nebulizer treatment without significant relief. He states that he was doing well y'day. Pt was admitted earlier in the summer for similar complaints and diagnosed with emphysema. He is not on O2. He has a cough, but it is not productive. He denies fevers. Pt has no hx of PE, DVT and denies any exogenous hormone (testosterone / estrogen) use, long distance travels or surgery in the past 6 weeks, active cancer, recent immobilization. No sick exposures.  Past Medical History:  Diagnosis Date  . ADHD (attention deficit hyperactivity disorder)   . Chlamydia 11/25/2018  . Emphysema of lung (HCC) 11/25/2018  . Environmental allergies   . Hypothyroidism 11/25/2018    Patient Active Problem List   Diagnosis Date Noted  . Acute respiratory failure with hypoxemia (HCC) 06/07/2019  . Community acquired pneumonia 06/07/2019  . Hypothyroidism 11/25/2018  . Chlamydia 11/25/2018  . SOB (shortness of breath) 11/25/2018  . Chest pain 11/25/2018  . Emphysema of lung (HCC) 11/25/2018  . Concussion with no loss of consciousness 07/27/2015  . ADHD (attention deficit hyperactivity disorder), combined type 06/07/2013  . MDD (major depressive disorder), single episode, moderate (HCC) 06/06/2013  . Mixed hyperlipidemia 02/26/2011  . Obesity 02/26/2011  . Pre-diabetes 02/26/2011  . Other specified acquired hypothyroidism 02/26/2011    History reviewed. No pertinent surgical history.      Home Medications    Prior to Admission medications   Medication Sig Start Date End Date Taking? Authorizing  Provider  acetaminophen (TYLENOL) 500 MG tablet Take 1,000 mg by mouth every 6 (six) hours as needed for moderate pain.   Yes [provider]  albuterol (PROVENTIL HFA;VENTOLIN HFA) 108 (90 Base) MCG/ACT inhaler Inhale 2 puffs into the lungs every 6 (six) hours as needed for wheezing or shortness of breath. 11/28/18  Yes Purohit, Salli QuarryShrey C, MD  ibuprofen (ADVIL) 200 MG tablet Take 400 mg by mouth every 6 (six) hours as needed for moderate pain.   Yes [provider]  azithromycin (ZITHROMAX) 500 MG tablet Take 1 tablet (500 mg total) by mouth daily for 5 days. Take 1 tablet daily for 3 days. 06/10/19 06/15/19  Rai, Delene Ruffiniipudeep K, MD  cefpodoxime (VANTIN) 200 MG tablet Take 1 tablet (200 mg total) by mouth 2 (two) times daily for 5 days. 06/09/19 06/14/19  Rai, Delene Ruffiniipudeep K, MD  levothyroxine (SYNTHROID) 75 MCG tablet Take 1 tablet (75 mcg total) by mouth daily before breakfast. 06/09/19   Rai, Ripudeep K, MD  mometasone-formoterol (DULERA) 100-5 MCG/ACT AERO Inhale 2 puffs into the lungs 2 (two) times daily. 06/09/19   Cathren Harshai, Ripudeep K, MD    Family History Family History  Problem Relation Age of Onset  . Hypertension Father   . Diabetes Father     Social History Social History   Tobacco Use  . Smoking status: Never Smoker  . Smokeless tobacco: Never Used  Substance Use Topics  . Alcohol use: No    Alcohol/week: 0.0 standard drinks  . Drug use: No     Allergies  Patient has no known allergies.   Review of Systems Review of Systems  Unable to perform ROS: Severe respiratory distress  Constitutional: Positive for activity change.  Respiratory: Positive for shortness of breath.   Cardiovascular: Negative for chest pain.  Allergic/Immunologic: Negative for immunocompromised state.     Physical Exam Updated Vital Signs BP 133/81 (BP Location: Right Wrist)   Pulse 88   Temp 98.1 F (36.7 C) (Oral)   Resp 20   Ht  (1.854 m)   Wt (!) 198.5 kg   SpO2 92%   BMI  57.74 kg/m   Physical Exam Vitals signs and nursing note reviewed.  Constitutional:      General: He is in acute distress.     Appearance: He is well-developed. He is not toxic-appearing.  HENT:     Head: Atraumatic.  Neck:     Musculoskeletal: Neck supple.     Vascular: No JVD.  Cardiovascular:     Rate and Rhythm: Tachycardia present.  Pulmonary:     Effort: Tachypnea and respiratory distress present.     Breath sounds: Examination of the left-upper field reveals rhonchi. Rhonchi present.  Musculoskeletal:     Right lower leg: No edema.     Left lower leg: No edema.  Skin:    General: Skin is warm.  Neurological:     Mental Status: He is alert and oriented to person, place, and time.      ED Treatments / Results  Labs (all labs ordered are listed, but only abnormal results are displayed) Labs Reviewed  URINE CULTURE - Abnormal; Notable for the following components:      Result Value   Culture 2,000 COLONIES/mL STAPHYLOCOCCUS HAEMOLYTICUS (*)    All other components within normal limits  CBC WITH DIFFERENTIAL/PLATELET - Abnormal; Notable for the following components:   WBC 12.8 (*)    RBC 5.88 (*)    MCH 25.9 (*)    RDW 15.7 (*)    Platelets 445 (*)    Lymphs Abs 4.1 (*)    Abs Immature Granulocytes 0.10 (*)    All other components within normal limits  BASIC METABOLIC PANEL - Abnormal; Notable for the following components:   Glucose, Bld 120 (*)    All other components within normal limits  URINALYSIS, ROUTINE W REFLEX MICROSCOPIC - Abnormal; Notable for the following components:   Protein, ur 100 (*)    Leukocytes,Ua TRACE (*)    All other components within normal limits  CBC - Abnormal; Notable for the following components:   WBC 15.9 (*)    MCH 25.5 (*)    RDW 15.8 (*)    Platelets 422 (*)    All other components within normal limits  COMPREHENSIVE METABOLIC PANEL - Abnormal; Notable for the following components:   CO2 19 (*)    Glucose, Bld 238 (*)     Calcium 8.8 (*)    All other components within normal limits  CBC - Abnormal; Notable for the following components:   WBC 15.5 (*)    MCH 25.6 (*)    RDW 15.9 (*)    Platelets 423 (*)    All other components within normal limits  GLUCOSE, CAPILLARY - Abnormal; Notable for the following components:   Glucose-Capillary 191 (*)    All other components within normal limits  HEMOGLOBIN A1C - Abnormal; Notable for the following components:   Hgb A1c MFr Bld 6.5 (*)    All other components within normal limits  GLUCOSE, CAPILLARY - Abnormal; Notable for the following components:   Glucose-Capillary 257 (*)    All other components within normal limits  CBC - Abnormal; Notable for the following components:   WBC 15.3 (*)    MCH 25.7 (*)    RDW 16.6 (*)    Platelets 438 (*)    All other components within normal limits  BASIC METABOLIC PANEL - Abnormal; Notable for the following components:   Glucose, Bld 151 (*)    Calcium 8.6 (*)    All other components within normal limits  GLUCOSE, CAPILLARY - Abnormal; Notable for the following components:   Glucose-Capillary 334 (*)    All other components within normal limits  GLUCOSE, CAPILLARY - Abnormal; Notable for the following components:   Glucose-Capillary 164 (*)    All other components within normal limits  SARS CORONAVIRUS 2 (HOSPITAL ORDER, PERFORMED IN [facility])  CULTURE, BLOOD (ROUTINE X 2)  CULTURE, BLOOD (ROUTINE X 2)  LACTIC ACID, PLASMA  LACTIC ACID, PLASMA  HIV ANTIBODY (ROUTINE TESTING W REFLEX)  STREP PNEUMONIAE URINARY ANTIGEN  CREATININE, SERUM  BRAIN NATRIURETIC PEPTIDE  TSH  T4, FREE  T3  GLUCOSE, CAPILLARY    EKG EKG Interpretation  Date/Time:  Sunday June 07 2019 18:33:20 EDT Ventricular Rate:  103 PR Interval:    QRS Duration: 91 QT Interval:  355 QTC Calculation: 465 R Axis:   101 Text Interpretation:  Sinus tachycardia Consider left atrial enlargement Borderline right axis  deviation Borderline T wave abnormalities No significant change since last tracing about 2 hrs earlier Confirmed by Devoria Albe ([id number]) on 06/08/2019 9:36:22 AM   Radiology No results found.  Procedures .Critical Care Performed by: Derwood Kaplan, MD Authorized by: Derwood Kaplan, MD   Critical care provider statement:    Critical care time (minutes):  36   Critical care was necessary to treat or prevent imminent or life-threatening deterioration of the following conditions:  Respiratory failure   Critical care was time spent personally by me on the following activities:  Discussions with consultants, evaluation of patient's response to treatment, examination of patient, ordering and performing treatments and interventions, ordering and review of laboratory studies, ordering and review of radiographic studies, pulse oximetry, re-evaluation of patient's condition, obtaining history from patient or surrogate and review of old charts   (including critical care time)  Medications Ordered in ED Medications  albuterol (VENTOLIN HFA) 108 (90 Base) MCG/ACT inhaler 6 puff (6 puffs Inhalation Given 06/07/19 1701)  methylPREDNISolone sodium succinate (SOLU-MEDROL) 125 mg/2 mL injection 125 mg (125 mg Intravenous Given 06/07/19 1656)  magnesium sulfate IVPB 2 g 50 mL (0 g Intravenous Stopped 06/07/19 1756)  ceFEPIme (MAXIPIME) 2 g in sodium chloride 0.9 % 100 mL IVPB (0 g Intravenous Stopped 06/07/19 2004)  azithromycin (ZITHROMAX) 500 mg in sodium chloride 0.9 % 250 mL IVPB (0 mg Intravenous Stopped 06/07/19 2239)  sodium chloride 0.9 % bolus 500 mL (0 mLs Intravenous Stopped 06/07/19 2111)  zolpidem (AMBIEN) tablet 5 mg (5 mg Oral Given 06/08/19 0331)  levalbuterol (XOPENEX) nebulizer solution 1.25 mg (1.25 mg Nebulization Given 06/08/19 0519)  furosemide (LASIX) injection 40 mg (40 mg Intravenous Given 06/08/19 1357)     Initial Impression / Assessment and Plan / ED Course  I have reviewed the triage  vital signs and the nursing notes.  Pertinent labs & imaging results that were available during my care of the patient were reviewed by me and considered in my medical decision making (  see chart for details).       Pt comes in with cc of DIB.  He is in hypoxic respiratory failure. He has some rhonchi on the L side. He has known structural lung disease.  CXR shows L sided PNA, likely multifocal. We will treat him for CAP with antibiotics recommended for someone with structural lung disease.  On reassessment, he is not in resp distress. Still hypoxic per RN on room air. Will admit. Covid test is neg.  Orval Dortch was evaluated in Emergency Department on 06/09/2019 for the symptoms described in the history of present illness. He was evaluated in the context of the global COVID-19 pandemic, which necessitated consideration that the patient might be at risk for infection with the SARS-CoV-2 virus that causes COVID-19. Institutional protocols and algorithms that pertain to the evaluation of patients at risk for COVID-19 are in a state of rapid change based on information released by regulatory bodies including the CDC and federal and state organizations. These policies and algorithms were followed during the patient's care in the ED.   Final Clinical Impressions(s) / ED Diagnoses   Final diagnoses:  Mixed hyperlipidemia  MDD (major depressive disorder), single episode, moderate ([place])  Acute respiratory failure with hypoxemia [facility])    ED Discharge Orders         Ordered    azithromycin (ZITHROMAX) 500 MG tablet  Daily     06/09/19 0839    cefpodoxime (VANTIN) 200 MG tablet  2 times daily     06/09/19 0839    mometasone-formoterol (DULERA) 100-5 MCG/ACT AERO  2 times daily     06/09/19 0839    levothyroxine (SYNTHROID) 75 MCG tablet  Daily before breakfast     06/09/19 0839    Increase activity slowly     06/09/19 0839    Diet Carb Modified     06/09/19 0839            Varney Biles, MD 06/09/19 2314

## 2019-06-09 NOTE — Telephone Encounter (Signed)
Patient was hospitalized from 9.27 - 9.29. Patient was seen for sleep consult with VS on 9.24.2020 and HST was ordered - patient is on the list to be called to schedule.  Called spoke with patient and informed him that VS ordered a HST at visit last week and someone will be calling him to schedule this.  Patient voiced his understanding. Nothing further needed; will sign off.

## 2019-06-09 NOTE — Progress Notes (Signed)
SATURATION QUALIFICATIONS: (This note is used to comply with regulatory documentation for home oxygen)  Patient Saturations on Room Air at Rest = 92%  Patient Saturations on Room Air while Ambulating = 87%  Patient Saturations on 2 Liters of oxygen while Ambulating = 93%  Please briefly explain why patient needs home oxygen: patient desats with ambulation

## 2019-06-09 NOTE — TOC Progression Note (Signed)
Transition of Care [facility]) - Progression Note    Patient Details  Name: Benjamin Rosario MRN: [id number] Date of Birth: [birth year]  Transition of Care [facility]) CM/SW [place], LCSW Phone Number: 06/09/2019, 9:49 AM  Clinical Narrative:   Patient needs oxygen prior to being discharge home. Patient agreeable for CSW to order equipment and is aware that he will need to wait for portable tank to be delivered to his room prior to discharge. Patient stated his girlfriend will pick him up           Expected Discharge Plan and Services           Expected Discharge Date: 06/09/19                                     Social Determinants of Health (SDOH) Interventions    Readmission Risk Interventions No flowsheet data found.

## 2019-06-09 NOTE — Discharge Summary (Signed)
Physician Discharge Summary   Patient ID: Benjamin Rosario MRN: [id number] DOB/AGE: June 06, 2001 18 y.o.  Admit date: 06/07/2019 Discharge date: 06/09/2019  Primary Care Physician:  Tracey HarriesBouska, David, MD   Recommendations for Outpatient Follow-up:  Follow up with Dr Craige CottaSood (pulmonology) in next 10 to 14 days to complete sleep study, CPAP, chest x-ray to ensure complete resolution of pneumonia  Patient qualifies for home O2, 2L  SATURATION QUALIFICATIONS: (This note is used to comply with regulatory documentation for home oxygen)  Patient Saturations on Room Air at Rest = 92%  Patient Saturations on Room Air while Ambulating = 87%  Patient Saturations on 2 Liters of oxygen while Ambulating = 93%   Home Health: None  Equipment/Devices: DME home O2 2L   Discharge Condition: stable  CODE STATUS: FULL  Diet recommendation: Carb modified diet  Discharge Diagnoses:    . Acute respiratory failure with hypoxemia (HCC) . Community acquired pneumonia . Mixed hyperlipidemia . Morbid obesity . MDD (major depressive disorder), single episode, moderate (HCC) . Hypothyroidism . ADHD (attention deficit hyperactivity disorder), combined type . Diabetes mellitus type 2  Consults: None    Allergies:  No Known Allergies   DISCHARGE MEDICATIONS: Allergies as of 06/09/2019   No Known Allergies     Medication List    TAKE these medications   acetaminophen 500 MG tablet Commonly known as: TYLENOL Take 1,000 mg by mouth every 6 (six) hours as needed for moderate pain.   albuterol 108 (90 Base) MCG/ACT inhaler Commonly known as: VENTOLIN HFA Inhale 2 puffs into the lungs every 6 (six) hours as needed for wheezing or shortness of breath.   azithromycin 500 MG tablet Commonly known as: Zithromax Take 1 tablet (500 mg total) by mouth daily for 5 days. Take 1 tablet daily for 3 days. Start taking on: June 10, 2019   cefpodoxime 200 MG tablet Commonly known as: VANTIN Take 1  tablet (200 mg total) by mouth 2 (two) times daily for 5 days.   ibuprofen 200 MG tablet Commonly known as: ADVIL Take 400 mg by mouth every 6 (six) hours as needed for moderate pain.   levothyroxine 75 MCG tablet Commonly known as: SYNTHROID Take 1 tablet (75 mcg total) by mouth daily before breakfast. What changed: when to take this   mometasone-formoterol 100-5 MCG/ACT Aero Commonly known as: DULERA Inhale 2 puffs into the lungs 2 (two) times daily.            Durable Medical Equipment  (From admission, onward)         Start     Ordered   06/09/19 0834  For home use only DME oxygen  Once    Question Answer Comment  Length of Need 6 Months   Mode or (Route) Nasal cannula   Liters per Minute 2   Frequency Continuous (stationary and portable oxygen unit needed)   Oxygen conserving device Yes   Oxygen delivery system Gas      06/09/19 0833           Brief H and P: For complete details please refer to admission H and P, but in brief Patient is a 18 year old male with history of ADHD, hypothyroidism, asthma/COPD, morbid obesity presented with shortness of breath and coughing.  Patient reported symptoms started the day before and has gotten worse.  Patient was recently seen by pulmonology for excessive snoring and was scheduled for home sleep study.  Patient used to be a smoker, used THC but not anymore COVID-19  test negative.  Chest x-ray showed left-sided pneumonia.  Patient was found to have significant wheezing with shortness of breath in ED.  He also reported left-sided pleuritic chest pain. Patient was admitted for further work-up.   Hospital Course:  Acute respiratory failure with hypoxemia [facility]): Multifactorial, secondary to left lung pneumonia, OSA, obesity hypoventilation, COPD -Feeling a lot better today, qualifies for home O2 2 L.  Home O2 evaluation was done, desaturates to 87% on ambulation  -Patient also recommended to follow-up with Dr. Halford Chessman to complete  sleep study, CPAP.  Patient was noted to be hypoxic at night hence a CPAP trial was done during hospitalization -COVID-19 test negative.  Urine strep antigen negative - BNP 87.4  2D echo 11/2018 had shown EF of 60 to 65%, moderately reduced right ventricular systolic function, left ventricular diastolic Doppler parameters consistent with impaired relaxation.    Patient was given Lasix 40 mg x1 due to possible mild fluid overload.   -He was placed on IV Zithromax, Rocephin, Dulera, albuterol while inpatient -Transition to oral Zithromax and Vantin for 5 more days to complete full course.  Patient was strongly recommended to follow-up outpatient with Dr. Halford Chessman, pulmonology    Hypothyroidism -TSH 8.1 in 11/2018, TSH 1.6 -Continue Synthroid  Diabetes mellitus, type II -Not on any medications -Hemoglobin A1c 6.5.  Hyperglycemia inpatient due to IV steroids  -Patient was placed on sliding scale insulin while inpatient -CBG 84 this morning, discussed with patient about oral hypoglycemics versus follow-up with PCP and recheck hemoglobin A1c.  Patient motivated to lifestyle changes, exercise and diet, weight control -He does not want any oral hypoglycemics at this time and wants to follow-up with his PCP  Morbid obesity -BMI 57 -Patient counseled on diet and weight control, lifestyle changes, appears to be motivated  ADHD -Not on any meds  History of asthma however also has emphysema on the recent CT -Placed on albuterol inhaler, Dulera  OSA, obesity hypoventilation syndrome Recommended CPAP at night, patient following pulmonology outpatient for sleep study    Day of Discharge S: Feels a lot better today, no fevers or chills, shortness of breath is improving.  Still has hypoxia on ambulation.  Patient wants to be discharged home as it is his [birth year] today.  BP 133/81 (BP Location: Right Wrist)   Pulse 88   Temp 98.1 F (36.7 C) (Oral)   Resp 20   Ht 6\' 1"  (1.854 m)   Wt (!)  198.5 kg   SpO2 92%   BMI 57.74 kg/m   Physical Exam: General: Alert and awake oriented x3 not in any acute distress. HEENT: anicteric sclera, pupils reactive to light and accommodation CVS: S1-S2 clear no murmur rubs or gallops Chest: clear to auscultation bilaterally, no wheezing rales or rhonchi Abdomen: Obesity, soft nontender, nondistended, normal bowel sounds Extremities: no cyanosis, clubbing or edema noted bilaterally Neuro: Cranial nerves II-XII intact, no focal neurological deficits   The results of significant diagnostics from this hospitalization (including imaging, microbiology, ancillary and laboratory) are listed below for reference.      Procedures/Studies:  Dg Chest Port 1 View  Result Date: 06/07/2019 CLINICAL DATA:  Shortness of breath EXAM: PORTABLE CHEST 1 VIEW COMPARISON:  Jan 25, 2019 FINDINGS: No pneumothorax. The right lung is clear. There is opacity in the left mid lower lung. The cardiomediastinal silhouette is stable with cardiomegaly. No pneumothorax. IMPRESSION: Infiltrate in left base worrisome for pneumonia. Atypical edema considered less likely. Recommend follow-up imaging to ensure complete resolution. No  other changes. Electronically Signed   By: Gerome Sam III M.D   On: 06/07/2019 17:46       LAB RESULTS: Basic Metabolic Panel: Recent Labs  Lab 06/08/19 0717 06/09/19 0409  NA 135 139  K 5.1 4.3  CL 104 105  CO2 19* 25  GLUCOSE 238* 151*  BUN 14 18  CREATININE 0.84 0.84  CALCIUM 8.8* 8.6*   Liver Function Tests: Recent Labs  Lab 06/08/19 0717  AST 22  ALT 30  ALKPHOS 63  BILITOT 0.6  PROT 7.5  ALBUMIN 3.7   No results for input(s): LIPASE, AMYLASE in the last 168 hours. No results for input(s): AMMONIA in the last 168 hours. CBC: Recent Labs  Lab 06/07/19 1648  06/08/19 0717 06/09/19 0409  WBC 12.8*   < > 15.5* 15.3*  NEUTROABS 7.5  --   --   --   HGB 15.2   < > 14.4 14.4  HCT 49.2   < > 47.7 47.8  MCV 83.7   <  > 84.7 85.2  PLT 445*   < > 423* 438*   < > = values in this interval not displayed.   Cardiac Enzymes: No results for input(s): CKTOTAL, CKMB, CKMBINDEX, TROPONINI in the last 168 hours. BNP: Invalid input(s): POCBNP CBG: Recent Labs  Lab 06/08/19 2137 06/09/19 0742  GLUCAP 334* 84      Disposition and Follow-up: Discharge Instructions    Diet Carb Modified   Complete by: As directed    Increase activity slowly   Complete by: As directed        DISPOSITION: Home   DISCHARGE FOLLOW-UP Follow-up Information    [name], MD. Schedule an appointment as soon as possible for a visit in 10 day(s).   Specialty: Pulmonary Disease Why: for hospital follow-up and sleep study for CPAP Contact information: [address] [place] [address] [phone]        Tracey Harries, MD. Schedule an appointment as soon as possible for a visit in 2 week(s).   Specialty: Family Medicine Why: please check HbA1C again for prediabetes Contact information: [address] [place] [address] [phone]            Time coordinating discharge:  35 minutes  Signed:   Thad Ranger M.D. [facility] 06/09/2019, 9:35 AM

## 2019-06-09 NOTE — Plan of Care (Signed)
Discharge instructions reviewed with patient, questions answered, verbalized understanding.  Patient transported via wheelchair to main entrance with his oxygen tanks to be taken home by significant other.

## 2019-06-10 ENCOUNTER — Emergency Department (HOSPITAL_COMMUNITY): Payer: BLUE CROSS/BLUE SHIELD

## 2019-06-10 ENCOUNTER — Observation Stay (HOSPITAL_COMMUNITY): Payer: BLUE CROSS/BLUE SHIELD

## 2019-06-10 ENCOUNTER — Inpatient Hospital Stay (HOSPITAL_COMMUNITY)
Admission: EM | Admit: 2019-06-10 | Discharge: 2019-06-15 | Disposition: A | Discharge status: Home / Self Care | Payer: BLUE CROSS/BLUE SHIELD | Source: Home / Self Care | Attending: Student | Admitting: Student

## 2019-06-10 ENCOUNTER — Other Ambulatory Visit: Payer: Self-pay

## 2019-06-10 ENCOUNTER — Telehealth: Payer: Self-pay | Admitting: Pulmonary Disease

## 2019-06-10 ENCOUNTER — Encounter (HOSPITAL_COMMUNITY): Payer: Self-pay | Admitting: Emergency Medicine

## 2019-06-10 DIAGNOSIS — J181 Lobar pneumonia, unspecified organism: Secondary | ICD-10-CM

## 2019-06-10 DIAGNOSIS — E876 Hypokalemia: Secondary | ICD-10-CM

## 2019-06-10 DIAGNOSIS — J9601 Acute respiratory failure with hypoxia: Secondary | ICD-10-CM | POA: Diagnosis present

## 2019-06-10 DIAGNOSIS — J9611 Chronic respiratory failure with hypoxia: Secondary | ICD-10-CM

## 2019-06-10 DIAGNOSIS — E662 Morbid (severe) obesity with alveolar hypoventilation: Secondary | ICD-10-CM

## 2019-06-10 DIAGNOSIS — J439 Emphysema, unspecified: Secondary | ICD-10-CM

## 2019-06-10 DIAGNOSIS — Z6841 Body Mass Index (BMI) 40.0 and over, adult: Secondary | ICD-10-CM

## 2019-06-10 DIAGNOSIS — J189 Pneumonia, unspecified organism: Secondary | ICD-10-CM

## 2019-06-10 DIAGNOSIS — T17908A Unspecified foreign body in respiratory tract, part unspecified causing other injury, initial encounter: Secondary | ICD-10-CM

## 2019-06-10 LAB — BASIC METABOLIC PANEL
Anion gap: 11 (ref 5–15)
BUN: 17 mg/dL (ref 6–20)
CO2: 21 mmol/L — ABNORMAL LOW (ref 22–32)
Calcium: 7.8 mg/dL — ABNORMAL LOW (ref 8.9–10.3)
Chloride: 106 mmol/L (ref 98–111)
Creatinine, Ser: 0.9 mg/dL (ref 0.61–1.24)
GFR calc Af Amer: >60 mL/min (ref >60)
GFR calc non Af Amer: >60 mL/min (ref >60)
Glucose, Bld: 110 mg/dL — ABNORMAL HIGH (ref 70–99)
Potassium: 3.3 mmol/L — ABNORMAL LOW (ref 3.5–5.1)
Sodium: 138 mmol/L (ref 135–145)

## 2019-06-10 LAB — EXPECTORATED SPUTUM ASSESSMENT W GRAM STAIN, RFLX TO RESP C

## 2019-06-10 LAB — BLOOD GAS, ARTERIAL
Acid-Base Excess: 0.7 mmol/L (ref 0.0–2.0)
Bicarbonate: 25 mmol/L (ref 20.0–28.0)
Drawn by: 257881
O2 Content: 2 L/min
O2 Saturation: 88.2 %
Patient temperature: 98.6
pCO2 arterial: 40.8 mmHg (ref 32.0–48.0)
pH, Arterial: 7.404 (ref 7.350–7.450)
pO2, Arterial: 56.7 mmHg — ABNORMAL LOW (ref 83.0–108.0)

## 2019-06-10 LAB — CBC WITH DIFFERENTIAL/PLATELET
Abs Immature Granulocytes: 0.07 10*3/uL (ref 0.00–0.07)
Basophils Absolute: 0.1 10*3/uL (ref 0.0–0.1)
Basophils Relative: 0 %
Eosinophils Absolute: 0.2 10*3/uL (ref 0.0–0.5)
Eosinophils Relative: 2 %
HCT: 43.5 % (ref 39.0–52.0)
Hemoglobin: 13.2 g/dL (ref 13.0–17.0)
Immature Granulocytes: 1 %
Lymphocytes Relative: 25 %
Lymphs Abs: 3.7 10*3/uL (ref 0.7–4.0)
MCH: 25.5 pg — ABNORMAL LOW (ref 26.0–34.0)
MCHC: 30.3 g/dL (ref 30.0–36.0)
MCV: 84 fL (ref 80.0–100.0)
Monocytes Absolute: 1 10*3/uL (ref 0.1–1.0)
Monocytes Relative: 7 %
Neutro Abs: 9.4 10*3/uL — ABNORMAL HIGH (ref 1.7–7.7)
Neutrophils Relative %: 65 %
Platelets: 429 10*3/uL — ABNORMAL HIGH (ref 150–400)
RBC: 5.18 MIL/uL (ref 4.22–5.81)
RDW: 16 % — ABNORMAL HIGH (ref 11.5–15.5)
WBC: 14.4 10*3/uL — ABNORMAL HIGH (ref 4.0–10.5)
nRBC: 0 % (ref 0.0–0.2)

## 2019-06-10 LAB — URINE CULTURE: Culture: 2000 — AB

## 2019-06-10 LAB — SARS CORONAVIRUS 2 BY RT PCR (HOSPITAL ORDER, PERFORMED IN ~~LOC~~ HOSPITAL LAB): SARS Coronavirus 2: NEGATIVE

## 2019-06-10 LAB — RAPID URINE DRUG SCREEN, HOSP PERFORMED
Amphetamines: NOT DETECTED
Barbiturates: NOT DETECTED
Benzodiazepines: NOT DETECTED
Cocaine: NOT DETECTED
Opiates: NOT DETECTED
Tetrahydrocannabinol: POSITIVE — AB

## 2019-06-10 MED ORDER — ALBUTEROL SULFATE (2.5 MG/3ML) 0.083% IN NEBU
5.0000 mg | INHALATION_SOLUTION | Freq: Once | RESPIRATORY_TRACT | Status: AC
  Administered 2019-06-10: 5 mg via RESPIRATORY_TRACT
  Filled 2019-06-10: qty 6

## 2019-06-10 MED ORDER — LEVOTHYROXINE SODIUM 50 MCG PO TABS
75.0000 ug | ORAL_TABLET | Freq: Every day | ORAL | Status: DC
  Administered 2019-06-11 – 2019-06-15 (×5): 75 ug via ORAL
  Filled 2019-06-10 (×5): qty 1

## 2019-06-10 MED ORDER — ACETAMINOPHEN 650 MG RE SUPP: 650.0000 mg | Freq: Four times a day (QID) | RECTAL | Status: DC | PRN

## 2019-06-10 MED ORDER — CHLORHEXIDINE GLUCONATE CLOTH 2 % EX PADS
6.0000 | MEDICATED_PAD | Freq: Every day | CUTANEOUS | Status: DC
  Administered 2019-06-10 – 2019-06-13 (×4): 6 via TOPICAL

## 2019-06-10 MED ORDER — SODIUM CHLORIDE 0.9% FLUSH: 3.0000 mL | INTRAVENOUS | Status: DC | PRN

## 2019-06-10 MED ORDER — ONDANSETRON HCL 4 MG/2ML IJ SOLN: 4.0000 mg | Freq: Four times a day (QID) | INTRAMUSCULAR | Status: DC | PRN

## 2019-06-10 MED ORDER — SODIUM CHLORIDE 0.9 % IV SOLN
500.0000 mg | INTRAVENOUS | Status: AC
  Administered 2019-06-11 – 2019-06-14 (×4): 500 mg via INTRAVENOUS
  Filled 2019-06-10 (×5): qty 500

## 2019-06-10 MED ORDER — ALBUTEROL SULFATE (2.5 MG/3ML) 0.083% IN NEBU
INHALATION_SOLUTION | RESPIRATORY_TRACT | Status: AC
  Filled 2019-06-10: qty 6

## 2019-06-10 MED ORDER — ACETAMINOPHEN 325 MG PO TABS: 650.0000 mg | ORAL_TABLET | ORAL | Status: DC | PRN

## 2019-06-10 MED ORDER — CEFDINIR 300 MG PO CAPS
300.0000 mg | ORAL_CAPSULE | Freq: Two times a day (BID) | ORAL | Status: DC
  Administered 2019-06-10: 300 mg via ORAL
  Filled 2019-06-10 (×2): qty 1

## 2019-06-10 MED ORDER — SODIUM CHLORIDE 0.9 % IV SOLN
1.0000 g | INTRAVENOUS | Status: AC
  Administered 2019-06-10 – 2019-06-14 (×5): 1 g via INTRAVENOUS
  Filled 2019-06-10: qty 1
  Filled 2019-06-10: qty 10
  Filled 2019-06-10 (×4): qty 1

## 2019-06-10 MED ORDER — ALBUTEROL SULFATE (2.5 MG/3ML) 0.083% IN NEBU: 2.5000 mg | INHALATION_SOLUTION | RESPIRATORY_TRACT | Status: DC | PRN

## 2019-06-10 MED ORDER — ACETAMINOPHEN 325 MG PO TABS
650.0000 mg | ORAL_TABLET | Freq: Four times a day (QID) | ORAL | Status: DC | PRN
  Administered 2019-06-12: 650 mg via ORAL
  Filled 2019-06-10: qty 2

## 2019-06-10 MED ORDER — ACETAMINOPHEN 325 MG PO TABS
650.0000 mg | ORAL_TABLET | ORAL | Status: DC | PRN
  Administered 2019-06-10: 650 mg via ORAL
  Filled 2019-06-10: qty 2

## 2019-06-10 MED ORDER — IPRATROPIUM-ALBUTEROL 20-100 MCG/ACT IN AERS
2.0000 | INHALATION_SPRAY | Freq: Two times a day (BID) | RESPIRATORY_TRACT | Status: DC
  Administered 2019-06-10 – 2019-06-15 (×11): 2 via RESPIRATORY_TRACT
  Filled 2019-06-10: qty 4

## 2019-06-10 MED ORDER — POTASSIUM CHLORIDE CRYS ER 20 MEQ PO TBCR
40.0000 meq | EXTENDED_RELEASE_TABLET | Freq: Once | ORAL | Status: AC
  Administered 2019-06-10: 13:00:00 40 meq via ORAL
  Filled 2019-06-10: qty 2

## 2019-06-10 MED ORDER — AZITHROMYCIN 250 MG PO TABS
500.0000 mg | ORAL_TABLET | Freq: Once | ORAL | Status: AC
  Administered 2019-06-10: 10:00:00 500 mg via ORAL
  Filled 2019-06-10: qty 2

## 2019-06-10 MED ORDER — SODIUM CHLORIDE 0.9% FLUSH
3.0000 mL | Freq: Two times a day (BID) | INTRAVENOUS | Status: DC
  Administered 2019-06-10 – 2019-06-15 (×9): 3 mL via INTRAVENOUS

## 2019-06-10 MED ORDER — ALBUTEROL SULFATE HFA 108 (90 BASE) MCG/ACT IN AERS
8.0000 | INHALATION_SPRAY | Freq: Once | RESPIRATORY_TRACT | Status: AC
  Administered 2019-06-10: 8 via RESPIRATORY_TRACT
  Filled 2019-06-10: qty 6.7

## 2019-06-10 MED ORDER — SODIUM CHLORIDE 0.9 % IV SOLN: 250.0000 mL | INTRAVENOUS | Status: DC | PRN

## 2019-06-10 MED ORDER — SODIUM CHLORIDE 0.9% FLUSH
3.0000 mL | Freq: Two times a day (BID) | INTRAVENOUS | Status: DC
  Administered 2019-06-10 – 2019-06-13 (×2): 3 mL via INTRAVENOUS

## 2019-06-10 MED ORDER — ONDANSETRON HCL 4 MG PO TABS: 4.0000 mg | ORAL_TABLET | Freq: Four times a day (QID) | ORAL | Status: DC | PRN

## 2019-06-10 MED ORDER — AEROCHAMBER PLUS FLO-VU MISC
1.0000 | Freq: Once | Status: DC
  Filled 2019-06-10: qty 1

## 2019-06-10 NOTE — Progress Notes (Signed)
CSW and RN CM received consult for patient needing assistance with a CPAP and obtaining his medication. RN CM Fuller Mandril provided patient with a [facility] letter for his medication. CSW spoke with patient's girlfriend at bedside (patient was asleep and would not wake up). CSW explained how to use the [facility] letter. CSW explained that patient will need to follow up with his sleep study appointments to get an order for a CPAP. CSW also spoke with CSW Gertha Calkin who stated she order patient oxygen yesterday through [employer], but [employer] has not been able to get in contact with patient to give him concentrators. CSW called Zack with [employer] and Edwyna Ready stated he was able to get with patient yesterday to give him the equipment. CSW made RN and EDP aware that patient has what he needs. CSW signing off.   [place], LCSW Transitions of Care Department [facility] ED [phone]

## 2019-06-10 NOTE — Consult Note (Addendum)
NAME:  Benjamin Rosario, MRN:  [id number], DOB:  Aug 01, 2001, LOS: 0 ADMISSION DATE:  06/10/2019, CONSULTATION DATE:  06/10/19 REFERRING MD:  Dr. Sarajane Jews, CHIEF COMPLAINT: SOB  Brief History   18 y/o M admitted on 9/30 with reports of shortness of breath.  Left sided airspace on CXR (present in 11/2018 as well).   History of present illness   18 y/o M admitted on 9/30 with reports of shortness of breath.   He was admitted in March 2020 for PNA and hypoxic respiratory failure.  At that time, he was recommended follow up with Pulmonary for suspected OSA.  His mother is a Marine scientist and is convinced he has sleep apnea (she lives in [place]).  He smokes THC daily and has since the age of 63.  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  At that time, he weighed approximately 325 lbs.  He currently weighs upwards of 430lbs.  He reports he eats for inability to sleep, comfort and anxiety.  He was seen by Dr. Halford Chessman on 9/24 and arranged for home sleep study, HRCT and PFT's.    He was admitted from 9/27-9/29 with hypoxemic respiratory failure and concern for left sided pneumonia. His ambulatory O2 sats were 87% with exertion on room air and was discharged on 2L.  The patient was treated with abx and discharged on azithromycin and cefpodoxime.    He presented to [facility] on 9/30 again with complaints of shortness of breath.  He states he has not been able to wear the oxygen while sleeping at night.  Reports he really does not sleep.  He will take up to 6 benadryl at a time in an effort to sleep. He indicates difficulty with lying flat as of late and has a "pressure" in his chest at times.  Notes wheezing intermittently.  Girlfriend reports he snores very loudly and has periods of apnea.  He also states he will wake himself up snoring.  Reports clear sputum.  Denies fevers, chills, back pain, chest pain.  ABG 7.404 / 40 / 56.7 / 25.  The patient was placed on O2 at 4L.  He had a coughing episode and O2 was increased by RT to 10L  salter.  CXR demonstrated persistent left sided infiltrates.  He denies family history of autoimmune diseases. Denies rashes.   PCCM consulted for evaluation.   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 rate (!) 33, height _0  (1.854 m), weight (!) 197.3 kg, SpO2 94 %.       No intake or output data in the 24 hours ending 06/10/19 1324 Filed Weights   06/10/19 0938  Weight: (!) 197.3 kg    Examination: General: morbidly obese male lying in bed in NAD  HEENT: MM pink/moist, short / thick neck, acanthosis nigricans noted on face/neck  Neuro: AAOx4, speech clear, MAE   CV: s1s2 rrr, no m/r/g PULM:  Mild tachypnea but no distress, lungs bilaterally coarse  GI: soft, bsx4 active  Extremities: warm/dry, no edema  Skin: no rashes or lesions  Resolved Hospital Problem list      Assessment & Plan:   Acute on Chronic Hypoxemic Respiratory Failure  -in setting of likely OSA, morbid obesity, possible small airways disease & secondary pulmonary hypertension (not enlarged RV on ECHO in 11/2018). R/O PE, infectious process P: Assess LE venous duplex  Wean O2 for sats >90% Wells Score low risk PE  Pulmonary hygiene -mobilize  Follow up ambulatory O2 needs assessment  Obtain HRCT to evaluate parenchyma   Left Airspace Disease  Paraseptal Emphysema  -suspect this is likely related to his THC habits.  DDx includes hypersensitivity pneumonitis, infectious process (less likely in  absence of symptoms), underlying autoimmune process P: Assess ESR, HSP Consider utility of Alpha-1 given young age and emphysema  Smoking cessation counseling  Will need outpatient PFT's   Likely OSA P: Pending home sleep study  CPAP QHS while inpatient  Avoid sedating medications > educated patient (note prior habit of excessive benadryl)  Best practice:  Diet: Per Primary  Pain/Anxiety/Delirium protocol (if indicated): n/a  VAP protocol (if indicated): n/a  DVT prophylaxis: per primary  GI prophylaxis: n/a  Glucose control: per primary  Mobility: as tolerated  Code Status: Full Code  Family Communication: Patient updated on plan of care  Disposition: Per primary.  PCCM will follow with you.  Thank you for the consultation.   Labs   CBC: Recent Labs  Lab 06/07/19 1648 06/07/19 2042 06/08/19 0717 06/09/19 0409 06/10/19 1000  WBC 12.8* 15.9* 15.5* 15.3* 14.4*  NEUTROABS 7.5  --   --   --  9.4*  HGB 15.2 14.6 14.4 14.4 13.2  HCT 49.2 48.2 47.7 47.8 43.5  MCV 83.7 84.1 84.7 85.2 84.0  PLT 445* 422* 423* 438* 429*    Basic Metabolic Panel: Recent Labs  Lab 06/07/19 1648 06/07/19 2042 06/08/19 0717 06/09/19 0409 06/10/19 1000  NA 140  --  135 139 138  K 3.9  --  5.1 4.3 3.3*  CL 105  --  104 105 106  CO2 26  --  19* 25 21*  GLUCOSE 120*  --  238* 151* 110*  BUN 13  --  _0 CREATININE 1.08 0.96 0.84 0.84 0.90  CALCIUM 8.9  --  8.8* 8.6* 7.8*   GFR: Estimated Creatinine Clearance: 235 mL/min (by C-G formula based on SCr of 0.9 mg/dL). Recent Labs  Lab 06/07/19 1754 06/07/19 1936 06/07/19 2042 06/08/19 0717 06/09/19 0409 06/10/19 1000  WBC  --   --  15.9* 15.5* 15.3* 14.4*  LATICACIDVEN 1.5 1.5  --   --   --   --     Liver Function Tests: Recent Labs  Lab 06/08/19 0717  AST 22  ALT 30  ALKPHOS 63  BILITOT 0.6  PROT 7.5  ALBUMIN 3.7   No results for input(s): LIPASE, AMYLASE in the last 168 hours. No results for input(s): AMMONIA in  the last 168 hours.  ABG    Component Value Date/Time   PHART 7.404 06/10/2019 1209   PCO2ART 40.8 06/10/2019 1209   PO2ART 56.7 (L) 06/10/2019 1209   HCO3 25.0 06/10/2019 1209   O2SAT 88.2 06/10/2019 1209     Coagulation Profile: No results for input(s): INR, PROTIME in the last 168 hours.  Cardiac Enzymes: No results for input(s): CKTOTAL, CKMB, CKMBINDEX, TROPONINI in the last 168 hours.  HbA1C: Hgb A1c MFr Bld  Date/Time  Value Ref Range Status  06/08/2019 01:50 PM 6.5 (H) 4.8 - 5.6 % Final    Comment:    (NOTE) Pre diabetes:          5.7%-6.4% Diabetes:              >6.4% Glycemic control for   <7.0% adults with diabetes   11/25/2018 08:02 AM 6.3 (H) 4.8 - 5.6 % Final    Comment:    (NOTE) Pre diabetes:          5.7%-6.4% Diabetes:              >6.4% Glycemic control for   <7.0% adults with diabetes     CBG: Recent Labs  Lab 06/08/19 0801 06/08/19 1656 06/08/19 2137 06/09/19 0742 06/09/19 1111  GLUCAP 191* 257* 334* 84 164*    Review of Systems: Positives in [place]   Gen: Denies fever, chills, weight change, fatigue, night sweats HEENT: Denies blurred vision, double vision, hearing loss, tinnitus, sinus congestion, rhinorrhea, sore throat, neck stiffness, dysphagia PULM: Denies shortness of breath, dry cough, clear sputum production, hemoptysis, wheezing CV: Denies chest pain, edema, orthopnea, paroxysmal nocturnal dyspnea, palpitations GI: Denies abdominal pain, nausea, vomiting, diarrhea, hematochezia, melena, constipation, change in bowel habits GU: Denies dysuria, hematuria, polyuria, oliguria, urethral discharge Endocrine: Denies hot or cold intolerance, polyuria, polyphagia or appetite change Derm: Denies rash, dry skin, scaling or peeling skin change Heme: Denies easy bruising, bleeding, bleeding gums Neuro: Denies headache, numbness, weakness, slurred speech, loss of memory or consciousness   Past Medical History  He,  has a past medical  history of ADHD (attention deficit hyperactivity disorder), Chlamydia (11/25/2018), Emphysema of lung ([place]) (11/25/2018), Environmental allergies, and Hypothyroidism (11/25/2018).   Surgical History   History reviewed. No pertinent surgical history.   Social History   reports that he has never smoked. He has never used smokeless tobacco. He reports current drug use. Drug: Marijuana. He reports that he does not drink alcohol.   Family History   His family history includes Diabetes in his father; Hypertension in his father.   Allergies No Known Allergies   Home Medications  Prior to Admission medications   Medication Sig Start Date End Date Taking? Authorizing Provider  albuterol (PROVENTIL HFA;VENTOLIN HFA) 108 (90 Base) MCG/ACT inhaler Inhale 2 puffs into the lungs every 6 (six) hours as needed for wheezing or shortness of breath. 11/28/18  Yes Purohit, Konrad Dolores, MD  azithromycin (ZITHROMAX) 500 MG tablet Take 1 tablet (500 mg total) by mouth daily for 5 days. Take 1 tablet daily for 3 days. 06/10/19 06/15/19  Rai, Vernelle Emerald, MD  cefpodoxime (VANTIN) 200 MG tablet Take 1 tablet (200 mg total) by mouth 2 (two) times daily for 5 days. 06/09/19 06/14/19  Rai, Vernelle Emerald, MD  levothyroxine (SYNTHROID) 75 MCG tablet Take 1 tablet (75 mcg total) by mouth daily before breakfast. 06/09/19   Rai, Ripudeep K, MD  mometasone-formoterol (DULERA) 100-5 MCG/ACT AERO Inhale 2 puffs into the lungs 2 (two) times daily. 06/09/19   Mendel Corning, MD     Critical care time: n/a    Noe Gens, NP-C [facility] Pgr: [phone] or if no answer [phone] 06/10/2019, 1:24 PM

## 2019-06-10 NOTE — ED Triage Notes (Signed)
Pt comes from home, complaining of SOB. Discharged from hospital with pneumonia few days ago. EMS states pt woke up SOB. EMS states pt had SpO2 at 89% on 2L at home.   BP 142/95 HR 107 SpO2 91 on 2L

## 2019-06-10 NOTE — H&P (Signed)
History and Physical  Benjamin Rosario NWG:[id number] DOB: Apr 30, 2001 DOA: 06/10/2019  PCP: Bernerd Limbo, MD   Chief Complaint: Short of breath  HPI:  18 year old man PMH morbid obesity, paraseptal emphysema seen on CT chest of unclear etiology, suspected sleep apnea, hospitalized for pneumonia 9/27-9/29 and discharged home on oxygen PRESENTED back 9/30 to [facility] ED for worsening shortness of breath, inability to sleep at home secondary to shortness of breath.  Admitted for worsened acute hypoxic respiratory failure.  Patient was treated for pneumonia and discharged home on oral antibiotics and 2 L nasal cannula.  He was able to go out to dinner last night but as he tried to go to bed, he found he was too short of breath.  He is unable to get any sleep as he kept waking up every 5 minutes.  He has more difficulty breathing and is breathing more rapidly.  Symptoms made worse by trying to lie flat.  No swelling.  No known lung disease as a child.  No history of blood clots.  No travel.  No COVID exposure known.  He was seen in the pulmonology clinic 9/24 at which time his plans were made for high-resolution CT chest and PFTs as well as outpatient home sleep study.  ED Course: Hypoxic with need for oxygen at 4 L with SPO2 high 80s-low 90s.  Obviously tachypneic with respiratory rate in the high 20s.  Review of Systems:  Negative for fever, visual changes, sore throat, rash, new muscle aches, dysuria, bleeding, n/v/abdominal pain.  Positive for some chest tightness when he is short of breath.  Past Medical History:  Diagnosis Date  . ADHD (attention deficit hyperactivity disorder)   . Chlamydia 11/25/2018  . Emphysema of lung ([place]) 11/25/2018  . Environmental allergies   . Hypothyroidism 11/25/2018    History reviewed. No pertinent surgical history.   reports that he has never smoked. He has never used smokeless tobacco. He reports current drug use. Drug: Marijuana. He reports that he  does not drink alcohol. Mobility: Ambulatory  No Known Allergies  Family History  Problem Relation Age of Onset  . Hypertension Father   . Diabetes Father      Prior to Admission medications   Medication Sig Start Date End Date Taking? Authorizing Provider  albuterol (PROVENTIL HFA;VENTOLIN HFA) 108 (90 Base) MCG/ACT inhaler Inhale 2 puffs into the lungs every 6 (six) hours as needed for wheezing or shortness of breath. 11/28/18  Yes Purohit, Konrad Dolores, MD  azithromycin (ZITHROMAX) 500 MG tablet Take 1 tablet (500 mg total) by mouth daily for 5 days. Take 1 tablet daily for 3 days. 06/10/19 06/15/19  Rai, Vernelle Emerald, MD  cefpodoxime (VANTIN) 200 MG tablet Take 1 tablet (200 mg total) by mouth 2 (two) times daily for 5 days. 06/09/19 06/14/19  Rai, Vernelle Emerald, MD  levothyroxine (SYNTHROID) 75 MCG tablet Take 1 tablet (75 mcg total) by mouth daily before breakfast. 06/09/19   Rai, Ripudeep K, MD  mometasone-formoterol (DULERA) 100-5 MCG/ACT AERO Inhale 2 puffs into the lungs 2 (two) times daily. 06/09/19   Mendel Corning, MD    Physical Exam: Vitals:   06/10/19 1200 06/10/19 1229  BP: (!) 168/152   Pulse: 99   Resp: (!) 33   Temp:    SpO2: (!) 89% 94%    Constitutional:   . Appears calm, uncomfortable, ill but not toxic.  Able to remain awake during interview. Eyes:  . pupils and irises appear normal . Grossly  normal lids ENMT:  . grossly normal hearing  . Lips appear normal Neck:  . neck appears normal, no masses . no thyromegaly Respiratory:  . Clear on the right but on the left there is some coarse breath sounds, no definite wheeze . Respiratory effort moderately increased.  Respiratory rate around 30.  Able to speak in short sentences. Cardiovascular:  . RRR, no m/r/g . No LE extremity edema   Abdomen:  . Abdomen appears normal; no tenderness or masses . No hernias noted Musculoskeletal:  . Digits/nails BUE: no clubbing, cyanosis, petechiae, infection . RUE, LUE, RLE,  LLE   . Grossly normal tone and strength Skin:  . No rashes, lesions, ulcers . palpation of skin: no induration or nodules Psychiatric:  . Mental status o Mood, affect appropriate . judgment and insight appear intact    I have personally reviewed following labs and imaging studies  Labs:  Potassium 3.3, remainder BMP unremarkable.  WBC 14.4 slightly improved compared to yesterday.  Remainder CBC unremarkable. SARS-CoV-2 negative  Imaging studies:   Chest x-ray independently reviewed shows left-sided infiltrate without significant change.  Radiology interpretation is persistent but improving left lower lung zone infiltrate.  Medical tests:   EKG independently reviewed: Sinus tachycardia with inferolateral ST changes.  Nonspecific.  Inferior changes seen on previous EKG.  Principal Problem:   Acute respiratory failure with hypoxia (HCC) Active Problems:   Hypokalemia   Morbid obesity with BMI of 50.0-59.9, adult (HCC)   Assessment/Plan 18 year old man who presents with worsening hypoxia, now on 4 L nasal cannula with respiratory rate in the 30s.  Just discharged 9/29 after being treated for pneumonia.  Acute/subacute hypoxic respiratory failure.  Right-sided paraseptal emphysema of unclear etiology.  Left lower lobe pneumonia. --Respiratory rate high 20s, low 30s with SPO2 high 80s, low 90s on 4 L --Appears tired but no indication for intubation at this point.  Trial CPAP. --Just recently treated for pneumonia and does still have leukocytosis.  Will cover with broad-spectrum antibiotics.   --Bronchodilators --Defer consideration of steroids to pulmonology.  Given outpatient plans for high-res CT, will hold off on advanced imaging pending pulmonology recommendations. --Abnormal left-sided pneumonia-like findings also seen March 2020.  Given the patient's worsened hypoxia, concern for other etiologies than pneumonia.  Pulmonology consultation placed for further recommendations.  SLP eval.  Suspected sleep apnea --Followed by pulmonology with plans for outpatient sleep study. --Continuous pulse oximetry while in the hospital.  Hypokalemia --Replete.  Morbid obesity --Dietitian consult  Severity of Illness: The appropriate patient status for this patient is OBSERVATION. Observation status is judged to be reasonable and necessary in order to provide the required intensity of service to ensure the patient's safety. The patient's presenting symptoms, physical exam findings, and initial radiographic and laboratory data in the context of their medical condition is felt to place them at decreased risk for further clinical deterioration. Furthermore, it is anticipated that the patient will be medically stable for discharge from the hospital within 2 midnights of admission. The following factors support the patient status of observation.   " The patient's presenting symptoms include shortness of breath,  " The physical exam findings include tachypnea, increased work of breathing, hypoxia. " The initial radiographic and laboratory data are left-sided pneumonia, hypokalemia    DVT prophylaxis:SCDs Code Status: Full Family Communication: none Consults called: pulmonology    Time spent: 60 minutes  Brendia Sacksaniel Leighanna Kirn, MD  [facility] Direct contact: see [email or web address]  7PM-7AM contact night coverage as below  1. Check the care team in [facility] and look for a) attending/consulting [facility] provider listed and b) the [facility] team listed 2. Log into [email or web address] and use [facility] universal password to access. If you do not have the password, please contact the hospital operator. 3. Locate the [facility] provider you are looking for under [facility] and page to a number that you can be directly reached. 4. If you still have difficulty reaching the provider, please page the [facility] (Director on Call) for the Hospitalists listed on amion for assistance.   06/10/2019, 12:39 PM

## 2019-06-10 NOTE — Telephone Encounter (Signed)
Left message for patient to call back  

## 2019-06-10 NOTE — ED Provider Notes (Signed)
[facility]-EMERGENCY DEPT Provider Note   CSN: [id number] Arrival date & time: 06/10/19  0919     History   Chief Complaint Chief Complaint  Patient presents with   Shortness of Breath    HPI Benjamin Rosario is a 18 y.o. male.     18 yo M with a chief complaint of shortness of breath.  Patient was just in the hospital and was discharged yesterday.  Since then he has had worsening shortness of breath on exertion and is been unable to sleep because every time he falls asleep he wakes up very short of breath and has to turn his oxygen up and sit straight up.  Eventually decided that he needed to come back to the hospital.  He states he thinks he left too early because it was his birthday and he wanted to get out of the hospital.  He was unable to get his antibiotics filled due to cost.  He also has not yet qualified for a CPAP machine.  The history is provided by the patient.  Shortness of Breath Severity:  Moderate Onset quality:  Gradual Duration:  2 days Timing:  Constant Progression:  Worsening Chronicity:  Recurrent Relieved by:  Nothing Worsened by:  Nothing Ineffective treatments:  None tried Associated symptoms: cough   Associated symptoms: no abdominal pain, no chest pain, no fever, no headaches, no rash and no vomiting     Past Medical History:  Diagnosis Date   ADHD (attention deficit hyperactivity disorder)    Chlamydia 11/25/2018   Emphysema of lung (HCC) 11/25/2018   Environmental allergies    Hypothyroidism 11/25/2018    Patient Active Problem List   Diagnosis Date Noted   Acute respiratory failure with hypoxia (HCC) 06/10/2019   Acute respiratory failure with hypoxemia (HCC) 06/07/2019   Community acquired pneumonia 06/07/2019   Hypothyroidism 11/25/2018   Chlamydia 11/25/2018   SOB (shortness of breath) 11/25/2018   Chest pain 11/25/2018   Emphysema of lung (HCC) 11/25/2018   Concussion with no loss of consciousness  07/27/2015   ADHD (attention deficit hyperactivity disorder), combined type 06/07/2013   MDD (major depressive disorder), single episode, moderate (HCC) 06/06/2013   Mixed hyperlipidemia 02/26/2011   Obesity 02/26/2011   Pre-diabetes 02/26/2011   Other specified acquired hypothyroidism 02/26/2011    History reviewed. No pertinent surgical history.      Home Medications    Prior to Admission medications   Medication Sig Start Date End Date Taking? Authorizing Provider  albuterol (PROVENTIL HFA;VENTOLIN HFA) 108 (90 Base) MCG/ACT inhaler Inhale 2 puffs into the lungs every 6 (six) hours as needed for wheezing or shortness of breath. 11/28/18  Yes Purohit, Salli Quarry, MD  azithromycin (ZITHROMAX) 500 MG tablet Take 1 tablet (500 mg total) by mouth daily for 5 days. Take 1 tablet daily for 3 days. 06/10/19 06/15/19  Rai, Delene Ruffini, MD  cefpodoxime (VANTIN) 200 MG tablet Take 1 tablet (200 mg total) by mouth 2 (two) times daily for 5 days. 06/09/19 06/14/19  Rai, Delene Ruffini, MD  levothyroxine (SYNTHROID) 75 MCG tablet Take 1 tablet (75 mcg total) by mouth daily before breakfast. 06/09/19   Rai, Ripudeep K, MD  mometasone-formoterol (DULERA) 100-5 MCG/ACT AERO Inhale 2 puffs into the lungs 2 (two) times daily. 06/09/19   Cathren Harsh, MD    Family History Family History  Problem Relation Age of Onset   Hypertension Father    Diabetes Father     Social History Social History  Tobacco Use   Smoking status: Never Smoker   Smokeless tobacco: Never Used  Substance Use Topics   Alcohol use: No    Alcohol/week: 0.0 standard drinks   Drug use: Yes    Types: Marijuana     Allergies   Patient has no known allergies.   Review of Systems Review of Systems  Constitutional: Negative for chills and fever.  HENT: Negative for congestion and facial swelling.   Eyes: Negative for discharge and visual disturbance.  Respiratory: Positive for cough and shortness of breath.     Cardiovascular: Negative for chest pain and palpitations.  Gastrointestinal: Negative for abdominal pain, diarrhea and vomiting.  Musculoskeletal: Negative for arthralgias and myalgias.  Skin: Negative for color change and rash.  Neurological: Negative for tremors, syncope and headaches.  Psychiatric/Behavioral: Negative for confusion and dysphoric mood.     Physical Exam Updated Vital Signs BP (!) 168/152    Pulse 99    Temp 98.7 F (37.1 C)    Resp (!) 33    Ht 6\' 1"  (1.854 m)    Wt (!) 197.3 kg    SpO2 (!) 89%    BMI 57.39 kg/m   Physical Exam Vitals signs and nursing note reviewed.  Constitutional:      Appearance: He is well-developed. He is morbidly obese.  HENT:     Head: Normocephalic and atraumatic.  Eyes:     Pupils: Pupils are equal, round, and reactive to light.  Neck:     Musculoskeletal: Normal range of motion and neck supple.     Vascular: No JVD.  Cardiovascular:     Rate and Rhythm: Normal rate and regular rhythm.     Heart sounds: No murmur. No friction rub. No gallop.   Pulmonary:     Effort: No respiratory distress.     Breath sounds: Examination of the left-lower field reveals rhonchi. Rhonchi present. No wheezing.  Abdominal:     General: There is no distension.     Tenderness: There is no guarding or rebound.  Musculoskeletal: Normal range of motion.  Skin:    Coloration: Skin is not pale.     Findings: No rash.  Neurological:     Mental Status: He is alert and oriented to person, place, and time.  Psychiatric:        Behavior: Behavior normal.      ED Treatments / Results  Labs (all labs ordered are listed, but only abnormal results are displayed) Labs Reviewed  CBC WITH DIFFERENTIAL/PLATELET - Abnormal; Notable for the following components:      Result Value   WBC 14.4 (*)    MCH 25.5 (*)    RDW 16.0 (*)    Platelets 429 (*)    Neutro Abs 9.4 (*)    All other components within normal limits  BASIC METABOLIC PANEL - Abnormal;  Notable for the following components:   Potassium 3.3 (*)    CO2 21 (*)    Glucose, Bld 110 (*)    Calcium 7.8 (*)    All other components within normal limits  SARS CORONAVIRUS 2 (HOSPITAL ORDER, [facility]FOR[facility])  BLOOD GAS, ARTERIAL    EKG EKG Interpretation  Date/Time:  Wednesday June 10 2019 09:49:20 EDT Ventricular Rate:  106 PR Interval:    QRS Duration: 100 QT Interval:  341 QTC Calculation: 453 R Axis:   95 Text Interpretation:  Sinus tachycardia Consider left atrial enlargement Borderline right axis deviation Borderline repolarization abnormality  NO SIGNIFICANT CHANGE SINCE LAST TRACING YESTERDAY Confirmed by Deno Etienne [phone]) on 06/10/2019 10:19:58 AM   Radiology Dg Chest Port 1 View  Result Date: 06/10/2019 CLINICAL DATA:  Cough.  Shortness of breath. EXAM: PORTABLE CHEST 1 VIEW COMPARISON:  06/07/2019 FINDINGS: There is a persistent left lower lung zone infiltrate with some improvement since the prior study. The heart size is essentially stable from prior study. There is no pneumothorax. No large pleural effusion. No pneumothorax. IMPRESSION: Persistent but improving left lower lung zone infiltrate. Electronically Signed   By: Constance Holster M.D.   On: 06/10/2019 10:19    Procedures Procedures (including critical care time)  Medications Ordered in ED Medications  Ipratropium-Albuterol (COMBIVENT) respimat 2 puff (2 puffs Inhalation Given 06/10/19 1021)  aerochamber plus with mask device 1 each (has no administration in time range)  cefdinir (OMNICEF) capsule 300 mg (300 mg Oral Given 06/10/19 1020)  albuterol (PROVENTIL) (2.5 MG/3ML) 0.083% nebulizer solution (has no administration in time range)  albuterol (VENTOLIN HFA) 108 (90 Base) MCG/ACT inhaler 8 puff (8 puffs Inhalation Given 06/10/19 0958)  azithromycin (ZITHROMAX) tablet 500 mg (500 mg Oral Given 06/10/19 1020)  albuterol (PROVENTIL) (2.5 MG/3ML) 0.083% nebulizer solution 5 mg  (5 mg Nebulization Given 06/10/19 1228)     Initial Impression / Assessment and Plan / ED Course  I have reviewed the triage vital signs and the nursing notes.  Pertinent labs & imaging results that were available during my care of the patient were reviewed by me and considered in my medical decision making (see chart for details).        18 yo M with a chief complaint of shortness of breath on exertion.  Going on for the past couple days.  Was just in the hospital for left lower lobe pneumonia.  Unable to get his antibiotics filled at home also has obvious sleep apnea.  I discussed the case with case management upon arrival.  Unfortunately the patient is unable to qualify for a CPAP machine unless he has a sleep study conducted.  They will work on trying to get his antibiotics filled.  The patient unfortunately is hypoxic with very minimal exertion.  He also has fallen asleep in the room and oxygen was down into the 70s on my exam.  Improves quickly with him awakening.  I discussed the case with the hospitalist.  Will admit.  CRITICAL CARE Performed by: Cecilio Asper   Total critical care time: 35 minutes  Critical care time was exclusive of separately billable procedures and treating other patients.  Critical care was necessary to treat or prevent imminent or life-threatening deterioration.  Critical care was time spent personally by me on the following activities: development of treatment plan with patient and/or surrogate as well as nursing, discussions with consultants, evaluation of patient's response to treatment, examination of patient, obtaining history from patient or surrogate, ordering and performing treatments and interventions, ordering and review of laboratory studies, ordering and review of radiographic studies, pulse oximetry and re-evaluation of patient's condition.  The patients results and plan were reviewed and discussed.   Any x-rays performed were  independently reviewed by myself.   Differential diagnosis were considered with the presenting HPI.  Medications  Ipratropium-Albuterol (COMBIVENT) respimat 2 puff (2 puffs Inhalation Given 06/10/19 1021)  aerochamber plus with mask device 1 each (has no administration in time range)  cefdinir (OMNICEF) capsule 300 mg (300 mg Oral Given 06/10/19 1020)  albuterol (PROVENTIL) (2.5 MG/3ML) 0.083% nebulizer solution (  has no administration in time range)  albuterol (VENTOLIN HFA) 108 (90 Base) MCG/ACT inhaler 8 puff (8 puffs Inhalation Given 06/10/19 0958)  azithromycin (ZITHROMAX) tablet 500 mg (500 mg Oral Given 06/10/19 1020)  albuterol (PROVENTIL) (2.5 MG/3ML) 0.083% nebulizer solution 5 mg (5 mg Nebulization Given 06/10/19 1228)    Vitals:   06/10/19 1036 06/10/19 1100 06/10/19 1130 06/10/19 1200  BP: 107/72 (!) 155/94 (!) 170/63 (!) 168/152  Pulse: 100 100 100 99  Resp: 17 (!) 36 (!) 34 (!) 33  Temp:      SpO2: 93% (!) 87% (!) 85% (!) 89%  Weight:      Height:        Final diagnoses:  Community acquired pneumonia of left lower lobe of lung    Admission/ observation were discussed with the admitting physician, patient and/or family and they are comfortable with the plan.    Final Clinical Impressions(s) / ED Diagnoses   Final diagnoses:  Community acquired pneumonia of left lower lobe of lung    ED Discharge Orders    None       Melene Plan, DO 06/10/19 1229

## 2019-06-10 NOTE — Discharge Planning (Signed)
ED CM consulted for medication assistance. EDCM reviewed chart to find that Pt is eligible for [facility] MATCH program (unable to find pt listed in PROCARE per cardholder name inquiry). PROCARE information entered. [place] letter completed and provided to pt by EDSW.

## 2019-06-10 NOTE — Progress Notes (Signed)
Pt has a dry cough which causes him to have more issues with his breathing. RT didn't hear any wheezes. His BBS was diminished. Pt's SATS were 88% on 5L. RT placed the Pt on a [place] on 10L. The Pt needed the flow and for his SATS. Pt also was given 5mg  nebulized albuterol

## 2019-06-11 ENCOUNTER — Observation Stay (HOSPITAL_COMMUNITY): Payer: BLUE CROSS/BLUE SHIELD

## 2019-06-11 ENCOUNTER — Observation Stay (HOSPITAL_BASED_OUTPATIENT_CLINIC_OR_DEPARTMENT_OTHER): Payer: BLUE CROSS/BLUE SHIELD

## 2019-06-11 DIAGNOSIS — M7989 Other specified soft tissue disorders: Secondary | ICD-10-CM

## 2019-06-11 DIAGNOSIS — R52 Pain, unspecified: Secondary | ICD-10-CM

## 2019-06-11 DIAGNOSIS — J430 Unilateral pulmonary emphysema [MacLeod's syndrome]: Secondary | ICD-10-CM

## 2019-06-11 DIAGNOSIS — J189 Pneumonia, unspecified organism: Principal | ICD-10-CM

## 2019-06-11 DIAGNOSIS — J9611 Chronic respiratory failure with hypoxia: Secondary | ICD-10-CM

## 2019-06-11 LAB — PROCALCITONIN: Procalcitonin: <0.1 ng/mL

## 2019-06-11 LAB — CBC
HCT: 43.1 % (ref 39.0–52.0)
Hemoglobin: 13.1 g/dL (ref 13.0–17.0)
MCH: 25.8 pg — ABNORMAL LOW (ref 26.0–34.0)
MCHC: 30.4 g/dL (ref 30.0–36.0)
MCV: 85 fL (ref 80.0–100.0)
Platelets: 391 10*3/uL (ref 150–400)
RBC: 5.07 MIL/uL (ref 4.22–5.81)
RDW: 16.2 % — ABNORMAL HIGH (ref 11.5–15.5)
WBC: 11.1 10*3/uL — ABNORMAL HIGH (ref 4.0–10.5)
nRBC: 0 % (ref 0.0–0.2)

## 2019-06-11 LAB — BASIC METABOLIC PANEL
Anion gap: 8 (ref 5–15)
BUN: 16 mg/dL (ref 6–20)
CO2: 24 mmol/L (ref 22–32)
Calcium: 8.7 mg/dL — ABNORMAL LOW (ref 8.9–10.3)
Chloride: 106 mmol/L (ref 98–111)
Creatinine, Ser: 1.04 mg/dL (ref 0.61–1.24)
GFR calc Af Amer: >60 mL/min (ref >60)
GFR calc non Af Amer: >60 mL/min (ref >60)
Glucose, Bld: 108 mg/dL — ABNORMAL HIGH (ref 70–99)
Potassium: 3.4 mmol/L — ABNORMAL LOW (ref 3.5–5.1)
Sodium: 138 mmol/L (ref 135–145)

## 2019-06-11 LAB — SEDIMENTATION RATE: Sed Rate: 18 mm/hr — ABNORMAL HIGH (ref 0–16)

## 2019-06-11 LAB — MRSA PCR SCREENING: MRSA by PCR: NEGATIVE

## 2019-06-11 MED ORDER — LABETALOL HCL 5 MG/ML IV SOLN
10.0000 mg | INTRAVENOUS | Status: DC | PRN
  Filled 2019-06-11: qty 4

## 2019-06-11 MED ORDER — AMLODIPINE BESYLATE 10 MG PO TABS
10.0000 mg | ORAL_TABLET | Freq: Every day | ORAL | Status: DC
  Administered 2019-06-11 – 2019-06-15 (×5): 10 mg via ORAL
  Filled 2019-06-11 (×5): qty 1

## 2019-06-11 MED ORDER — POTASSIUM CHLORIDE CRYS ER 20 MEQ PO TBCR
40.0000 meq | EXTENDED_RELEASE_TABLET | ORAL | Status: AC
  Administered 2019-06-11 (×2): 40 meq via ORAL
  Filled 2019-06-11 (×2): qty 2

## 2019-06-11 NOTE — Consult Note (Signed)
NAME:  Benjamin Rosario, MRN:  [id number], DOB:  2001-09-10, LOS: 0 ADMISSION DATE:  06/10/2019, CONSULTATION DATE:  06/10/19 REFERRING MD:  Dr. Sarajane Jews, CHIEF COMPLAINT: SOB  Brief History   18 y/o M admitted on 9/30 with reports of shortness of breath.  Left sided airspace on CXR (present in 11/2018 as well). Heavy THC use.  Suspected OSA.  Recent admit   History of present illness   18 y/o M admitted on 9/30 with reports of shortness of breath.   He was admitted in March 2020 for PNA and hypoxic respiratory failure.  At that time, he was recommended follow up with Pulmonary for suspected OSA.  His mother is a Marine scientist and is convinced he has sleep apnea (she lives in [place]).  He smokes THC daily and has since the age of 12.  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  At that time, he weighed approximately 325 lbs.  He currently weighs upwards of 430lbs.  He reports he eats for inability to sleep, comfort and anxiety.  He was seen by Dr. Halford Chessman on 9/24 and arranged for home sleep study, HRCT and PFT's.    He was admitted from 9/27-9/29 with hypoxemic respiratory failure and concern for left sided pneumonia. His ambulatory O2 sats were 87% with exertion on room air and was discharged on 2L.  The patient was treated with abx and discharged on azithromycin and cefpodoxime.    He presented to [facility] on 9/30 again with complaints of shortness of breath.  He states he has not been able to wear the oxygen while sleeping at night.  Reports he really does not sleep.  He will take up to 6 benadryl at a time in an effort to sleep. He indicates difficulty with lying flat as of late and has a "pressure" in his chest at times.  Notes wheezing intermittently.  Girlfriend reports he snores very loudly and has periods of apnea.  He also states he will wake himself up snoring.  Reports clear sputum.  Denies fevers, chills, back pain, chest pain.  ABG 7.404 / 40 / 56.7 / 25.  The patient was placed on O2 at 4L.  He had a  coughing episode and O2 was increased by RT to 10L salter.  CXR demonstrated persistent left sided infiltrates.  He denies family history of autoimmune diseases. Denies rashes.   PCCM consulted for evaluation.   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  Feels so much better with CPAPA use.   Objective   Blood pressure (!) 172/104, pulse 93, temperature 97.6 F (36.4 C), temperature source Oral, resp. rate 17, height 6\' 1"  (1.854 m), weight (!) 198.8 kg, SpO2 90 %.        Intake/Output Summary (Last 24 hours) at 06/11/2019 1144 Last data filed at 06/11/2019 1100 Gross per 24 hour  Intake 1068.07 ml  Output 925 ml  Net 143.07 ml   Filed Weights   06/10/19 0938 06/10/19 1816  Weight: (!) 197.3 kg (!) 198.8 kg    Examination: General: obese male lying in bed in NAD HEENT: MM pink/moist, darkening of skin noted Neuro: AAOx4, speech clear, MAE  CV: s1s2 rrr, no m/r/g PULM:  Non-labored, lungs bilaterally clear GI: soft, bsx4 active  Extremities: warm/dry, difficult to assess for edema due to body habitus   Skin: no rashes or lesions  Resolved Hospital Problem list      Assessment & Plan:   Acute on Chronic Hypoxemic Respiratory Failure  -in setting of likely OSA, morbid obesity, possible small airways disease & secondary pulmonary hypertension (not enlarged RV on ECHO in 11/2018). R/O PE, infectious  process -LE duplex negative / Well's Score low risk for PE  P: Wean O2 for sats >90% Expect if he will wear CPAP, he may not need O2 during daytime  Pulmonary hygiene - mobilize Follow up ambulatory O2 needs assessment  HRCT as above   Left Airspace Disease  Paraseptal Emphysema  -suspect this is likely related to his THC habits.  DDx includes hypersensitivity pneumonitis, infectious process (less likely in absence of symptoms), underlying autoimmune process -sed rate 18 -strep pneumo negative 9/27 Air Trapping, Small Airways Disease P: Follow up HSP panel  Smoking cessation counseling provided  Will need outpatient PFT's  Continue PRN albuterol at discharge  Likely OSA P: Arrange for home CPAP Home sleep study pending, he has been seen by Dr. Craige Cotta for sleep.   CPAP QHS while inpatient  Avoid sedating medications   Morbid Obesity  P: Reviewed dietary plan with patient.  Discussed starting small with things like cutting out soda and eating more fresh foods.  Also reviewed walking 30 minutes per day.    HTN  P: Per primary   Best practice:  Diet: Per Primary  Pain/Anxiety/Delirium protocol (if indicated): n/a  VAP protocol (if indicated): n/a  DVT prophylaxis: per primary  GI prophylaxis: n/a  Glucose control: per primary  Mobility: as tolerated  Code Status: Full Code  Family Communication: Patient updated on plan of care  Disposition: Per primary.  PCCM will follow with you.  Thank you for the consultation.   Labs   CBC: Recent Labs  Lab 06/07/19 1648 06/07/19 2042 06/08/19 0717 06/09/19 0409 06/10/19 1000 06/11/19 0211  WBC 12.8* 15.9* 15.5* 15.3* 14.4* 11.1*  NEUTROABS 7.5  --   --   --  9.4*  --   HGB 15.2 14.6 14.4 14.4 13.2 13.1  HCT 49.2 48.2 47.7 47.8 43.5 43.1  MCV 83.7 84.1 84.7 85.2 84.0 85.0  PLT 445* 422* 423* 438* 429* 391    Basic Metabolic Panel: Recent Labs  Lab 06/07/19 1648 06/07/19 2042 06/08/19 0717 06/09/19 0409 06/10/19  1000 06/11/19 0211  NA 140  --  135 139 138 138  K 3.9  --  5.1 4.3 3.3* 3.4*  CL 105  --  104 105 106 106  CO2 26  --  19* 25 21* 24  GLUCOSE 120*  --  238* 151* 110* 108*  BUN 13  --  14 18 17 16   CREATININE 1.08 0.96 0.84 0.84 0.90 1.04  CALCIUM 8.9  --  8.8* 8.6* 7.8* 8.7*   GFR: Estimated Creatinine Clearance: 204.3 mL/min (by C-G formula based on SCr of 1.04 mg/dL). Recent Labs  Lab 06/07/19 1754 06/07/19 1936  06/08/19 0717 06/09/19 0409 06/10/19 1000 06/11/19 0211  WBC  --   --    < >  15.5* 15.3* 14.4* 11.1*  LATICACIDVEN 1.5 1.5  --   --   --   --   --    < > = values in this interval not displayed.    Liver Function Tests: Recent Labs  Lab 06/08/19 0717  AST 22  ALT 30  ALKPHOS 63  BILITOT 0.6  PROT 7.5  ALBUMIN 3.7   No results for input(s): LIPASE, AMYLASE in the last 168 hours. No results for input(s): AMMONIA in the last 168 hours.  ABG    Component Value Date/Time   PHART 7.404 06/10/2019 1209   PCO2ART 40.8 06/10/2019 1209   PO2ART 56.7 (L) 06/10/2019 1209   HCO3 25.0 06/10/2019 1209   O2SAT 88.2 06/10/2019 1209     Coagulation Profile: No results for input(s): INR, PROTIME in the last 168 hours.  Cardiac Enzymes: No results for input(s): CKTOTAL, CKMB, CKMBINDEX, TROPONINI in the last 168 hours.  HbA1C: Hgb A1c MFr Bld  Date/Time Value Ref Range Status  06/08/2019 01:50 PM 6.5 (H) 4.8 - 5.6 % Final    Comment:    (NOTE) Pre diabetes:          5.7%-6.4% Diabetes:              >6.4% Glycemic control for   <7.0% adults with diabetes   11/25/2018 08:02 AM 6.3 (H) 4.8 - 5.6 % Final    Comment:    (NOTE) Pre diabetes:          5.7%-6.4% Diabetes:              >6.4% Glycemic control for   <7.0% adults with diabetes     CBG: Recent Labs  Lab 06/08/19 0801 06/08/19 1656 06/08/19 2137 06/09/19 0742 06/09/19 1111  GLUCAP 191* 257* 334* 84 164*      Critical care time: n/a    Canary BrimBrandi Kanchan Gal, NP-C [facility] Pgr: [phone] or if no answer [phone] 06/11/2019, 11:44 AM

## 2019-06-11 NOTE — Telephone Encounter (Signed)
I had tried to call pt yesterday to schedule hst and phone just rang.  He will need to call us when he is discharged so we can see who has a machine available.

## 2019-06-11 NOTE — Telephone Encounter (Signed)
Called spoke with patient. He is currently admitted to hospital. He states he cannot sleep without his cpap. Dr. Halford Chessman in office today spoke with him. He states that Velna Hatchet our NP is rounding on patient in hospital. Once patient is discharged he will need HST ASAP, "drive from hospital to office to pick up machine" -Dr. Halford Chessman  Will route to [facility] to follow patient so when discharged he can have the HST ASAP  Follow up watch when patient is discharged from hospital

## 2019-06-11 NOTE — Telephone Encounter (Signed)
Call returned to patient, made aware we will need a definite date as to when he will be D/C as the [facility] need to be sure they have a machine available to him. I made him aware when he calls back ask to speak with a [facility] and we will get the message to them to try and accomodate him as much as possible. Voiced understanding. States he will try and find out a definite date and will give [place] a call back.   Will hold message for now.

## 2019-06-11 NOTE — Evaluation (Signed)
Clinical/Bedside Swallow Evaluation Patient Details  Name: Benjamin Rosario MRN: [id number] Date of Birth: 04-12-2001  Today's Date: 06/11/2019 Time: SLP Start Time (ACUTE ONLY): 4196 SLP Stop Time (ACUTE ONLY): 1715 SLP Time Calculation (min) (ACUTE ONLY): 10 min  Past Medical History:  Past Medical History:  Diagnosis Date  . ADHD (attention deficit hyperactivity disorder)   . Chlamydia 11/25/2018  . Emphysema of lung ([place]) 11/25/2018  . Environmental allergies   . Hypothyroidism 11/25/2018   Past Surgical History: History reviewed. No pertinent surgical history. HPI:  pt is 18 yo male adm to [facility] with respiratory deficits, found to have hypoxic respiratory failure.  Pt has h/o GERD which he states is only intermittent and he manages via diet modifiers.  Pt was on CPAP PRN in hospital and plans are for a sleep study.   Assessment / Plan / Recommendation Clinical Impression  Patient with negative cranial nerve exam and no neurological hx.  He denies dysphagia except when gulping drinks stating he feels pressure in his throat which will clear.  Reflux problems reports if he eats certain foods.  3 ounce [facility] water test passed easily.  Note esophagram negative.  If pt is aspirating, would suspect reflux related due to diet.  Advised pt to follow reflux precautions. SLP Visit Diagnosis: Dysphagia, unspecified (R13.10)    Aspiration Risk  No limitations    Diet Recommendation Regular;Thin liquid   Liquid Administration via: Cup;Straw Medication Administration: Whole meds with liquid Supervision: Patient able to self feed Compensations: Slow rate;Small sips/bites Postural Changes: Seated upright at 90 degrees;Remain upright for at least 30 minutes after po intake    Other  Recommendations Oral Care Recommendations: Oral care BID   Follow up Recommendations None      Frequency and Duration     n/a       Prognosis   n/a     Swallow Study   General Date of Onset: 06/11/19 HPI: pt  is 18 yo male adm to [facility] with respiratory deficits, found to have hypoxic respiratory failure.  Pt has h/o GERD which he states is only intermittent and he manages via diet modifiers.  Pt was on CPAP PRN in hospital and plans are for a sleep study. Type of Study: Bedside Swallow Evaluation Diet Prior to this Study: Regular;Thin liquids Temperature Spikes Noted: No Respiratory Status: Room air History of Recent Intubation: No Behavior/Cognition: Alert;Cooperative;Pleasant mood Oral Cavity Assessment: Within Functional Limits Oral Care Completed by SLP: No Oral Cavity - Dentition: Adequate natural dentition Vision: Functional for self-feeding Self-Feeding Abilities: Able to feed self Patient Positioning: Upright in bed Baseline Vocal Quality: Normal Volitional Cough: Strong Volitional Swallow: Able to elicit    Oral/Motor/Sensory Function Overall Oral Motor/Sensory Function: Within functional limits   Ice Chips Ice chips: Not tested   Thin Liquid Thin Liquid: Within functional limits Presentation: Cup    Nectar Thick Nectar Thick Liquid: Not tested   Honey Thick Honey Thick Liquid: Not tested   Puree Puree: Not tested   Solid     Solid: Within functional limits Presentation: Self Fed      Macario Golds 06/11/2019,5:30 PM    Luanna Salk, MS [facility] SLP Acute Rehab Services Pager [phone] Office [phone]

## 2019-06-11 NOTE — Progress Notes (Signed)
Tried calling report to [place]. The nurse is busy with another patient. Will call me at [phone] when available.

## 2019-06-11 NOTE — Progress Notes (Signed)
Bilateral lower extremity venous duplex has been completed. Preliminary results can be found in CV Proc through chart review.   06/11/19 9:59 AM Carlos Levering RVT

## 2019-06-11 NOTE — Progress Notes (Signed)
PROGRESS NOTE  Benjamin Rosario ZOX:[id number] DOB: 01/16/2001   PCP: Tracey Harries, MD  Patient is from: Home  DOA: 06/10/2019 LOS: 0  Brief Narrative / Interim history: 18 year old man PMH morbid obesity, paraseptal emphysema seen on CT chest of unclear etiology, suspected sleep apnea, hospitalized for pneumonia 9/27-9/29 and discharged home on 3 L oxygen returned 9/30 to [facility] ED for worsening shortness of breath, inability to sleep at home secondary to shortness of breath.  Admitted for worsened acute hypoxic respiratory failure.  In ED, hypoxic to 87% requiring 4 L to maintain saturation in upper 80s to lower 90s.  Tachypneic.  Started on ceftriaxone and azithromycin.  Pulmonology consulted and admitted.  Subjective: No major events overnight of this morning.  Spend the night on CPAP.  Breathing better this morning.  Denies cough or chest pain.  Denies nausea, vomiting or abdominal pain.  Significant other at bedside reports loud snoring when he sleeps.  Objective: Vitals:   06/11/19 1000 06/11/19 1014 06/11/19 1100 06/11/19 1127  BP: (!) 169/92   (!) 172/104  Pulse: 86 91 89 93  Resp: 17 (!) Temp:      TempSrc:      SpO2: 100% 97% 96% 90%  Weight:      Height:        Intake/Output Summary (Last 24 hours) at 06/11/2019 1221 Last data filed at 06/11/2019 1100 Gross per 24 hour  Intake 1068.07 ml  Output 925 ml  Net 143.07 ml   Filed Weights   06/10/19 0938 06/10/19 1816  Weight: (!) 197.3 kg (!) 198.8 kg    Examination:  GENERAL: No acute distress.  Appears well.  HEENT: MMM.  Vision and hearing grossly intact.  NECK: Supple.  No apparent JVD.  RESP:  No IWOB. Good air movement bilaterally. CVS:  RRR. Heart sounds normal.  ABD/GI/GU: Bowel sounds present. Soft. Non tender.  MSK/EXT:  Moves extremities. No apparent deformity or edema.  SKIN: no apparent skin lesion or wound NEURO: Awake, alert and oriented appropriately.  No gross deficit.  PSYCH:  Calm. Normal affect.   Assessment & Plan: Acute/subacute respiratory failure with hypoxia: suspect OSA and possible OHS.  Could be due to residual left lung pneumonia.  He also have right-sided paraseptal emphysema of unclear etiology. Still with tachypnea but feels better. -Check procalcitonin-we will discontinue antibiotics if negative. -Nightly CPAP-pulmonology to arrange home CPAP. -PRN breathing treatments -Wean oxygen as able -Check alpha antitrypsin  Morbid obesity: at risk for OSA/OHS.  BMI 57.82. -Encourage lifestyle change to lose weight. -Continue nightly CPAP.  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  CBG within fair range. -We will continue monitoring -Could benefit from metformin.  Hypokalemia -Replenish and recheck.  Leukocytosis: Improved.  Marijuana use -Encourage cessation.  Hypothyroidism: Recent thyroid panel within normal range. -Continue home Synthroid.  Elevated blood pressure slight sinus tachycardia: No history of hypertension. -Start amlodipine -PRN labetalol  DVT prophylaxis: SCD Code Status: Full code Family Communication: Significant other at bedside. Disposition Plan: Transfer to MedSurg Consultants: Pulmonology  Procedures:  None  Microbiology summarized: COVID-19 negative. MRSA PCR screen negative. Blood cultures negative so far.  Antimicrobials: Anti-infectives (From admission, onward)   Start     Dose/Rate Route Frequency Ordered Stop   06/11/19 1000  azithromycin (ZITHROMAX) 500 mg in sodium chloride 0.9 % 250 mL IVPB     500 mg 250 mL/hr over 60 Minutes Intravenous Every 24 hours 06/10/19 1737 06/15/19 0959   06/10/19 2200  cefTRIAXone (ROCEPHIN)  1 g in sodium chloride 0.9 % 100 mL IVPB     1 g 200 mL/hr over 30 Minutes Intravenous Every 24 hours 06/10/19 1737 06/15/19 2159   06/10/19 1000  azithromycin (ZITHROMAX) tablet 500 mg     500 mg Oral  Once 06/10/19 0955 06/10/19 1020   06/10/19 1000  cefdinir (OMNICEF) capsule 300 mg  Status:   Discontinued     300 mg Oral Every 12 hours 06/10/19 0955 06/10/19 1737      Sch Meds:  Scheduled Meds: . amLODipine  10 mg Oral Daily  . Chlorhexidine Gluconate Cloth  6 each Topical Daily  . Ipratropium-Albuterol  2 puff Inhalation BID  . levothyroxine  75 mcg Oral Q0600  . sodium chloride flush  3 mL Intravenous Q12H  . sodium chloride flush  3 mL Intravenous Q12H   Continuous Infusions: . sodium chloride    . azithromycin Stopped (06/11/19 1041)  . cefTRIAXone (ROCEPHIN)  IV Stopped (06/10/19 2237)   PRN Meds:.sodium chloride, acetaminophen **OR** acetaminophen, albuterol, labetalol, ondansetron **OR** ondansetron (ZOFRAN) IV, sodium chloride flush   I have personally reviewed the following labs and images: CBC: Recent Labs  Lab 06/07/19 1648 06/07/19 2042 06/08/19 0717 06/09/19 0409 06/10/19 1000 06/11/19 0211  WBC 12.8* 15.9* 15.5* 15.3* 14.4* 11.1*  NEUTROABS 7.5  --   --   --  9.4*  --   HGB 15.2 14.6 14.4 14.4 13.2 13.1  HCT 49.2 48.2 47.7 47.8 43.5 43.1  MCV 83.7 84.1 84.7 85.2 84.0 85.0  PLT 445* 422* 423* 438* 429* 391   BMP &GFR Recent Labs  Lab 06/07/19 1648 06/07/19 2042 06/08/19 0717 06/09/19 0409 06/10/19 1000 06/11/19 0211  NA 140  --  135 139 138 138  K 3.9  --  5.1 4.3 3.3* 3.4*  CL 105  --  104 105 106 106  CO2 26  --  19* 25 21* 24  GLUCOSE 120*  --  238* 151* 110* 108*  BUN 13  --  14 18 17 16   CREATININE 1.08 0.96 0.84 0.84 0.90 1.04  CALCIUM 8.9  --  8.8* 8.6* 7.8* 8.7*   Estimated Creatinine Clearance: 204.3 mL/min (by C-G formula based on SCr of 1.04 mg/dL). Liver & Pancreas: Recent Labs  Lab 06/08/19 0717  AST 22  ALT 30  ALKPHOS 63  BILITOT 0.6  PROT 7.5  ALBUMIN 3.7   No results for input(s): LIPASE, AMYLASE in the last 168 hours. No results for input(s): AMMONIA in the last 168 hours. Diabetic: Recent Labs    06/08/19 1350  HGBA1C 6.5*   Recent Labs  Lab 06/08/19 0801 06/08/19 1656 06/08/19 2137 06/09/19  0742 06/09/19 1111  GLUCAP 191* 257* 334* 84 164*   Cardiac Enzymes: No results for input(s): CKTOTAL, CKMB, CKMBINDEX, TROPONINI in the last 168 hours. No results for input(s): PROBNP in the last 8760 hours. Coagulation Profile: No results for input(s): INR, PROTIME in the last 168 hours. Thyroid Function Tests: Recent Labs    06/08/19 1350  TSH 1.608  FREET4 0.87   Lipid Profile: No results for input(s): CHOL, HDL, LDLCALC, TRIG, CHOLHDL, LDLDIRECT in the last 72 hours. Anemia Panel: No results for input(s): VITAMINB12, FOLATE, FERRITIN, TIBC, IRON, RETICCTPCT in the last 72 hours. Urine analysis:    Component Value Date/Time   COLORURINE YELLOW 06/07/2019 1754   APPEARANCEUR CLEAR 06/07/2019 1754   LABSPEC 1.021 06/07/2019 1754   PHURINE 6.0 06/07/2019 1754   GLUCOSEU NEGATIVE 06/07/2019 1754  HGBUR NEGATIVE 06/07/2019 1754   [place] 06/07/2019 1754   KETONESUR NEGATIVE 06/07/2019 1754   PROTEINUR 100 (A) 06/07/2019 1754   UROBILINOGEN 0.2 06/09/2013 0731   NITRITE NEGATIVE 06/07/2019 1754   LEUKOCYTESUR TRACE (A) 06/07/2019 1754   Sepsis Labs: Invalid input(s): PROCALCITONIN, [place]  Microbiology: Recent Results (from the past 240 hour(s))  SARS Coronavirus 2 [facility] order, Performed in [facility] hospital lab) Nasopharyngeal Nasopharyngeal Swab     Status: None   Collection Time: 06/07/19  5:00 PM   Specimen: Nasopharyngeal Swab  Result Value Ref Range Status   SARS Coronavirus 2 NEGATIVE NEGATIVE Final    Comment: (NOTE) If result is NEGATIVE SARS-CoV-2 target nucleic acids are NOT DETECTED. The SARS-CoV-2 RNA is generally detectable in upper and lower  respiratory specimens during the acute phase of infection. The lowest  concentration of SARS-CoV-2 viral copies this assay can detect is 250  copies / mL. A negative result does not preclude SARS-CoV-2 infection  and should not be used as the sole basis for treatment or other  patient  management decisions.  A negative result may occur with  improper specimen collection / handling, submission of specimen other  than nasopharyngeal swab, presence of viral mutation(s) within the  areas targeted by this assay, and inadequate number of viral copies  (<250 copies / mL). A negative result must be combined with clinical  observations, patient history, and epidemiological information. If result is POSITIVE SARS-CoV-2 target nucleic acids are DETECTED. The SARS-CoV-2 RNA is generally detectable in upper and lower  respiratory specimens dur ing the acute phase of infection.  Positive  results are indicative of active infection with SARS-CoV-2.  Clinical  correlation with patient history and other diagnostic information is  necessary to determine patient infection status.  Positive results do  not rule out bacterial infection or co-infection with other viruses. If result is PRESUMPTIVE POSTIVE SARS-CoV-2 nucleic acids MAY BE PRESENT.   A presumptive positive result was obtained on the submitted specimen  and confirmed on repeat testing.  While 2019 novel coronavirus  (SARS-CoV-2) nucleic acids may be present in the submitted sample  additional confirmatory testing may be necessary for epidemiological  and / or clinical management purposes  to differentiate between  SARS-CoV-2 and other Sarbecovirus currently known to infect humans.  If clinically indicated additional testing with an alternate test  methodology [phone]) is advised. The SARS-CoV-2 RNA is generally  detectable in upper and lower respiratory sp ecimens during the acute  phase of infection. The expected result is Negative. Fact Sheet for Patients:  StrictlyIdeas.no Fact Sheet for Healthcare Providers: BankingDealers.co.za This test is not yet approved or cleared by the [place] FDA and has been authorized for detection and/or diagnosis of SARS-CoV-2 by FDA under  an Emergency Use Authorization (EUA).  This EUA will remain in effect (meaning this test can be used) for the duration of the COVID-19 declaration under Section 564(b)(1) of the Act, 21 U.S.C. section 360bbb-3(b)(1), unless the authorization is terminated or revoked sooner. Performed at [facility], [place] [address] [place], [place] [address]   Urine culture     Status: Abnormal   Collection Time: 06/07/19  5:54 PM   Specimen: In/Out Cath Urine  Result Value Ref Range Status   Specimen Description   Final    IN/OUT CATH URINE Performed at [facility] [address], [place], [place] [address]    Special Requests   Final    NONE  Performed at [facility], [address] [place], [place] [address]    Culture 2,000 COLONIES/mL STAPHYLOCOCCUS HAEMOLYTICUS (A)  Final   Report Status 06/10/2019 FINAL  Final   Organism ID, Bacteria STAPHYLOCOCCUS HAEMOLYTICUS (A)  Final      Susceptibility   Staphylococcus haemolyticus - MIC*    CIPROFLOXACIN <=0.5 SENSITIVE Sensitive     GENTAMICIN <=0.5 SENSITIVE Sensitive     NITROFURANTOIN <=16 SENSITIVE Sensitive     OXACILLIN <=0.25 SENSITIVE Sensitive     TETRACYCLINE <=1 SENSITIVE Sensitive     VANCOMYCIN <=0.5 SENSITIVE Sensitive     TRIMETH/SULFA <=10 SENSITIVE Sensitive     CLINDAMYCIN <=0.25 SENSITIVE Sensitive     RIFAMPIN <=0.5 SENSITIVE Sensitive     Inducible Clindamycin NEGATIVE Sensitive     * 2,000 COLONIES/mL STAPHYLOCOCCUS HAEMOLYTICUS  Blood Culture (routine x 2)     Status: None (Preliminary result)   Collection Time: 06/07/19  5:55 PM   Specimen: BLOOD LEFT ARM  Result Value Ref Range Status   Specimen Description   Final    BLOOD LEFT ARM Performed at [facility], [address] [place], [place] [address]    Special Requests   Final    BOTTLES DRAWN AEROBIC AND ANAEROBIC Blood Culture adequate volume Performed at [facility], [address], [place], [place] [address]    Culture   Final    NO GROWTH 4 DAYS Performed at [facility], [address] [place], [place] [address]    Report Status PENDING  Incomplete  Blood Culture (routine x 2)     Status: None (Preliminary result)   Collection Time: 06/07/19  7:37 PM   Specimen: BLOOD  Result Value Ref Range Status   Specimen Description   Final    BLOOD RIGHT ANTECUBITAL Performed at [facility], [address], [place], [place] [address]    Special Requests   Final    BOTTLES DRAWN AEROBIC AND ANAEROBIC Blood Culture adequate volume Performed at [facility], [address] [place], [place] [address]    Culture   Final    NO GROWTH 4 DAYS Performed at [facility], [address] [place], [place] [address]    Report Status PENDING  Incomplete  SARS Coronavirus 2 [facility] order, Performed in [facility] hospital lab) Nasopharyngeal Nasopharyngeal Swab     Status: None   Collection Time: 06/10/19 10:26 AM   Specimen: Nasopharyngeal Swab  Result Value Ref Range Status   SARS Coronavirus 2 NEGATIVE NEGATIVE Final    Comment: (NOTE) If result is NEGATIVE SARS-CoV-2 target nucleic acids are NOT DETECTED. The SARS-CoV-2 RNA is generally detectable in upper and lower  respiratory specimens during the acute phase of infection. The lowest  concentration of SARS-CoV-2 viral copies this assay can detect is 250  copies / mL. A negative result does not preclude SARS-CoV-2 infection  and should not be used as the sole basis for treatment or other  patient management decisions.  A negative result may occur with  improper specimen collection / handling, submission of specimen other  than nasopharyngeal swab, presence of viral mutation(s) within the  areas targeted by this assay, and inadequate number of viral copies  (<250 copies / mL). A negative result must be combined with clinical  observations, patient history,  and epidemiological information. If result is POSITIVE SARS-CoV-2 target nucleic acids are DETECTED. The SARS-CoV-2 RNA is generally detectable in  upper and lower  respiratory specimens dur ing the acute phase of infection.  Positive  results are indicative of active infection with SARS-CoV-2.  Clinical  correlation with patient history and other diagnostic information is  necessary to determine patient infection status.  Positive results do  not rule out bacterial infection or co-infection with other viruses. If result is PRESUMPTIVE POSTIVE SARS-CoV-2 nucleic acids MAY BE PRESENT.   A presumptive positive result was obtained on the submitted specimen  and confirmed on repeat testing.  While 2019 novel coronavirus  (SARS-CoV-2) nucleic acids may be present in the submitted sample  additional confirmatory testing may be necessary for epidemiological  and / or clinical management purposes  to differentiate between  SARS-CoV-2 and other Sarbecovirus currently known to infect humans.  If clinically indicated additional testing with an alternate test  methodology [phone]) is advised. The SARS-CoV-2 RNA is generally  detectable in upper and lower respiratory sp ecimens during the acute  phase of infection. The expected result is Negative. Fact Sheet for Patients:  BoilerBrush.com.cy Fact Sheet for Healthcare Providers: https://pope.com/ This test is not yet approved or cleared by the [place] FDA and has been authorized for detection and/or diagnosis of SARS-CoV-2 by FDA under an Emergency Use Authorization (EUA).  This EUA will remain in effect (meaning this test can be used) for the duration of the COVID-19 declaration under Section 564(b)(1) of the Act, 21 U.S.C. section 360bbb-3(b)(1), unless the authorization is terminated or revoked sooner. Performed at [facility], [address], [place], [place] [address]    Culture, sputum-assessment     Status: None   Collection Time: 06/10/19  5:30 PM   Specimen: Sputum  Result Value Ref Range Status   Specimen Description SPU  Final   Special Requests NONE  Final   Sputum evaluation   Final    Sputum specimen not acceptable for testing.  Please recollect.   Performed at [facility], [address], [place], [place] [address]    Report Status 06/10/2019 FINAL  Final  Culture, blood (routine x 2) Call MD if unable to obtain prior to antibiotics being given     Status: None (Preliminary result)   Collection Time: 06/10/19  6:10 PM   Specimen: BLOOD RIGHT HAND  Result Value Ref Range Status   Specimen Description   Final    BLOOD RIGHT HAND Performed at [facility], [address] [place], [place] [address]    Special Requests   Final    BOTTLES DRAWN AEROBIC ONLY Blood Culture results may not be optimal due to an inadequate volume of blood received in culture bottles Performed at [facility], [address], [place], [place] [address]    Culture   Final    NO GROWTH < 12 HOURS Performed at [facility], [address] [place], [place] [address]    Report Status PENDING  Incomplete  Culture, blood (routine x 2) Call MD if unable to obtain prior to antibiotics being given     Status: None (Preliminary result)   Collection Time: 06/10/19  6:15 PM   Specimen: BLOOD  Result Value Ref Range Status   Specimen Description   Final    BLOOD LEFT ANTECUBITAL Performed at [facility], [address], [place], [place] [address]    Special Requests   Final    BOTTLES DRAWN AEROBIC ONLY Blood Culture results may not be optimal due to  an inadequate volume of blood received in culture bottles Performed at [facility], [address] [place], [place] [address]    Culture   Final    NO GROWTH < 12 HOURS Performed at [facility], [address], [place], [place] [address]    Report Status PENDING  Incomplete  MRSA PCR Screening     Status: None   Collection Time: 06/10/19 11:33 PM   Specimen: Nasopharyngeal  Result Value Ref Range Status   MRSA by PCR NEGATIVE NEGATIVE Final    Comment:        The GeneXpert MRSA Assay (FDA approved for NASAL specimens only), is one component of a comprehensive MRSA colonization surveillance program. It is not intended to diagnose MRSA infection nor to guide or monitor treatment for MRSA infections. Performed at [facility], [address], [place], [place] [address]     Radiology Studies: Ct Chest High Resolution  Result Date: 06/11/2019 CLINICAL DATA:  Emphysema, shortness of breath, pneumonia, wheezing, cough EXAM: CT CHEST WITHOUT CONTRAST TECHNIQUE: Multidetector CT imaging of the chest was performed following the standard protocol without intravenous contrast. High resolution imaging of the lungs, as well as inspiratory and expiratory imaging, was performed. 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 Otherwise normal configuration of the great vessels with the exception of incidental variant origin of the left vertebral artery from the aortic arch. Normal heart size. No pericardial effusion. Mediastinum/Nodes: Prominent mediastinal lymph nodes, likely reactive. Age-appropriate thymic remnant in the anterior mediastinum. Thyroid gland, trachea, and esophagus demonstrate no significant findings. Lungs/Pleura: There is extensive heterogeneous airspace disease in the left lower lobe. There are scattered small pulmonary nodules of the left upper lobe, likely residua of prior infection seen on CT dated 11/25/2018 and measuring 4 mm or smaller (e.g. series 11, image 45, 41). There is moderate paraseptal and centrilobular emphysema predominantly involving the right lung and right  upper lobe, with significant volume loss of the right lung and extensive bronchial wall thickening throughout. No pleural effusion or pneumothorax. Upper Abdomen: No acute abnormality. Musculoskeletal: No chest wall mass or suspicious bone lesions identified. IMPRESSION: 1. There is extensive heterogeneous airspace disease in the left lower lobe, consistent with infection. 2. There is moderate paraseptal and centrilobular emphysema predominantly involving the right lung and right upper lobe, unusually advanced for patient age and with significant volume loss of the right lung. Extensive bronchial wall thickening throughout the right lung. 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 This constellation of findings has been reported in the literature and may present with recurrent infection and hemoptysis. Emphysema (ICD10-J43.9). 3. There are scattered small pulmonary nodules of the left upper lobe, likely residua of prior infection seen on CT dated 11/25/2018 and measuring 4 mm or smaller (e.g. series 11, image 45, 41). Electronically Signed   By: Lauralyn Primes M.D.   On: 06/11/2019 08:27   Vas [place] Lower Extremity Venous (dvt)  Result Date: 06/11/2019  Lower Venous Study Indications: Swelling.  Risk Factors: None identified. Limitations: Body habitus and poor ultrasound/tissue interface. Comparison Study: No prior studies. Performing Technologist: Chanda Busing RVT  Examination Guidelines: A complete evaluation includes B-mode imaging, spectral Doppler, color Doppler, and power Doppler as needed of all accessible portions of each vessel. Bilateral testing is considered an integral part of a complete examination. Limited examinations for reoccurring indications may be performed as  noted.  +---------+---------------+---------+-----------+----------+--------------+ RIGHT    CompressibilityPhasicitySpontaneityPropertiesThrombus Aging  +---------+---------------+---------+-----------+----------+--------------+ CFV      Full           Yes      Yes                                 +---------+---------------+---------+-----------+----------+--------------+ SFJ      Full                                                        +---------+---------------+---------+-----------+----------+--------------+ FV Prox  Full                                                        +---------+---------------+---------+-----------+----------+--------------+ FV Mid   Full                                                        +---------+---------------+---------+-----------+----------+--------------+ FV Distal               Yes      Yes                                 +---------+---------------+---------+-----------+----------+--------------+ PFV      Full                                                        +---------+---------------+---------+-----------+----------+--------------+ POP      Full           Yes      Yes                                 +---------+---------------+---------+-----------+----------+--------------+ PTV      Full                                                        +---------+---------------+---------+-----------+----------+--------------+ PERO                                                  Not visualized +---------+---------------+---------+-----------+----------+--------------+   +---------+---------------+---------+-----------+----------+--------------+ LEFT     CompressibilityPhasicitySpontaneityPropertiesThrombus Aging +---------+---------------+---------+-----------+----------+--------------+ CFV      Full           Yes      Yes                                 +---------+---------------+---------+-----------+----------+--------------+  SFJ      Full                                                         +---------+---------------+---------+-----------+----------+--------------+ FV Prox  Full                                                        +---------+---------------+---------+-----------+----------+--------------+ FV Mid   Full                                                        +---------+---------------+---------+-----------+----------+--------------+ FV DistalFull                                                        +---------+---------------+---------+-----------+----------+--------------+ PFV      Full                                                        +---------+---------------+---------+-----------+----------+--------------+ POP      Full           Yes      Yes                                 +---------+---------------+---------+-----------+----------+--------------+ PTV      Full                                                        +---------+---------------+---------+-----------+----------+--------------+ PERO                                                  Not visualized +---------+---------------+---------+-----------+----------+--------------+     Summary: Right: There is no evidence of deep vein thrombosis in the lower extremity. However, portions of this examination were limited- see technologist comments above. No cystic structure found in the popliteal fossa. Left: There is no evidence of deep vein thrombosis in the lower extremity. However, portions of this examination were limited- see technologist comments above. No cystic structure found in the popliteal fossa.  *See table(s) above for measurements and observations.    Preliminary    Dg Esophagus W Single Cm (sol Or Thin Ba)  Result Date: 06/11/2019 CLINICAL DATA:  Evaluate for aspiration.  Recurrent pneumonia. EXAM: ESOPHOGRAM/BARIUM SWALLOW TECHNIQUE: Single contrast examination was performed using  thin barium. FLUOROSCOPY TIME:  Fluoroscopy Time:  1 minutes and 48 seconds  Radiation Exposure Index (if provided by the fluoroscopic device): 56.5 Number of Acquired Spot Images: 0 COMPARISON:  Chest CT 06/10/2019 FINDINGS: Focused, single-contrast exam performed secondary to patient immobility and body habitus. Exam was performed only in LPO position. Evaluation of esophageal peristalsis demonstrates a normal primary peristaltic wave on 2 swallows. Full column evaluation of the esophagus, including on series 5, demonstrates no persistent narrowing to suggest stricture. No hiatal hernia. IMPRESSION: 1. Limited, focused exam secondary to patient body habitus and immobility, as detailed above. 2. No evidence of esophageal dysmotility or hernia. No findings to predispose to aspiration. Electronically Signed   By: Jeronimo Greaves M.D.   On: 06/11/2019 09:57    Julieann Drummonds T. Meria Crilly [facility]  If 7PM-7AM, please contact night-coverage [email or web address] Password [email or web address] 06/11/2019, 12:21 PM

## 2019-06-12 DIAGNOSIS — G4733 Obstructive sleep apnea (adult) (pediatric): Secondary | ICD-10-CM

## 2019-06-12 DIAGNOSIS — R9389 Abnormal findings on diagnostic imaging of other specified body structures: Secondary | ICD-10-CM

## 2019-06-12 LAB — PROCALCITONIN: Procalcitonin: <0.1 ng/mL

## 2019-06-12 LAB — CULTURE, BLOOD (ROUTINE X 2)
Culture: NO GROWTH
Culture: NO GROWTH
Special Requests: ADEQUATE
Special Requests: ADEQUATE

## 2019-06-12 LAB — ALPHA-1-ANTITRYPSIN: A-1 Antitrypsin, Ser: 144 mg/dL (ref 95–164)

## 2019-06-12 MED ORDER — PRO-STAT SUGAR FREE PO LIQD
30.0000 mL | Freq: Three times a day (TID) | ORAL | Status: DC
  Administered 2019-06-12 – 2019-06-14 (×2): 30 mL via ORAL
  Filled 2019-06-12 (×3): qty 30

## 2019-06-12 NOTE — Progress Notes (Signed)
NAME:  Benjamin Rosario, MRN:  [id number], DOB:  04/16/2001, LOS: 1 ADMISSION DATE:  06/10/2019, CONSULTATION DATE:  06/10/19 REFERRING MD:  Dr. Irene Limbo, CHIEF COMPLAINT: SOB  Brief History   18 y/o M admitted on 9/30 with reports of shortness of breath.  Left sided airspace on CXR (present in 11/2018 as well). Heavy THC use.  Suspected OSA.  Recent admit   History of present illness   18 y/o M admitted on 9/30 with reports of shortness of breath.   He was admitted in March 2020 for PNA and hypoxic respiratory failure.  At that time, he was recommended follow up with Pulmonary for suspected OSA.  His mother is a Engineer, civil (consulting) and is convinced he has sleep apnea (she lives in [place]).  He smokes THC daily and has since the age of 79.  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  At that time, he weighed approximately 325 lbs.  He currently weighs upwards of 430lbs.  He reports he eats for inability to sleep, comfort and anxiety.  He was seen by Dr. Craige Cotta on 9/24 and arranged for home sleep study, HRCT and PFT's.    He was admitted from 9/27-9/29 with hypoxemic respiratory failure and concern for left sided pneumonia. His ambulatory O2 sats were 87% with exertion on room air and was discharged on 2L.  The patient was treated with abx and discharged on azithromycin and cefpodoxime.    He presented to [facility] on 9/30 again with complaints of shortness of breath.  He states he has not been able to wear the oxygen while sleeping at night.  Reports he really does not sleep.  He will take up to 6 benadryl at a time in an effort to sleep. He indicates difficulty with lying flat as of late and has a "pressure" in his chest at times.  Notes wheezing intermittently.  Girlfriend reports he snores very loudly and has periods of apnea.  He also states he will wake himself up snoring.  Reports clear sputum.  Denies fevers, chills, back pain, chest pain.  ABG 7.404 / 40 / 56.7 / 25.  The patient was placed on O2 at 4L.  He had a  coughing episode and O2 was increased by RT to 10L [place].  CXR demonstrated persistent left sided infiltrates.  He denies family history of autoimmune diseases. Denies rashes.   PCCM consulted for evaluation.   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 He feels much better since he was admitted. He thinks his OSA has gotten so severe that he is too tired to get out of bed at home, which has worsened his breathing. Today he denies complaints.  Objective   Blood pressure (!) 145/85, pulse 93, temperature (!) 97.5 F (36.4 C), temperature source Oral, resp. rate 20, height 6\' 1"  (1.854 m), weight (!) 198.8 kg, SpO2 94 %.        Intake/Output Summary (Last 24 hours) at 06/12/2019 1357  Last data filed at 06/12/2019 0900 Gross per 24 hour  Intake 480 ml  Output -  Net 480 ml   Filed Weights   06/10/19 0938 06/10/19 1816  Weight: (!) 197.3 kg (!) 198.8 kg    Examination: General: obese young man laying in bed in NAD, awake and interactive HEENT: [place]/AT, eyes anicteric Neuro: Awake, alert, normal speech, face symmetric, moving all extremities spontaneously.  No focal deficits CV: Regular rate and rhythm, S1-S2, no murmurs PULM: Breathing comfortably on room air, speaking in full sentences.  Decreased breath sounds in the bases, but otherwise clear to auscultation bilaterally. GI: Obese, soft, nontender Extremities: No clubbing or cyanosis, no edema  skin: No  rashes or wounds  Resolved Hospital Problem list      Assessment & Plan:   Acute on Chronic Hypoxemic Respiratory Failure due to obstructive sleep apnea.  Possibly obesity hypoventilation syndrome, which is improved with nocturnal positive pressure ventilation. -in setting of likely OSA, morbid obesity, possible small airways disease & secondary pulmonary hypertension (not enlarged RV on ECHO in 11/2018). R/O PE, infectious process -LE duplex negative / Well's Score low risk for PE  P: -Supplemental oxygen as required for SPO2 less than 90, but currently doing well on room air -Continue nocturnal CPAP, but no supplemental oxygen tonight.  I spoken to respiratory, and if he desaturates this should be documented and he should be started on supplemental oxygen with CPAP, which he would need to be discharged on. -Agree that he needs an outpatient split-night PSG - Outpatient follow-up for left lower lobe infiltrate - Consider obtaining fungal and routine respiratory culture if he has productive cough -Counseled on the importance of weight loss  Left Airspace Disease  Paraseptal Emphysema  -suspect this is likely related to his THC habits.  DDx includes hypersensitivity pneumonitis, infectious process (less likely in absence of symptoms), underlying autoimmune process -sed rate 18 -strep pneumo negative 9/27 Air Trapping, Small Airways Disease P: -Counseled on the importance of avoiding smoking and inhalational drugs of any type, including vaping -Outpatient follow-up for left lower lobe infiltrate - Cultures as above -Outpatient PFTs   Likely OSA P: -Is unlikely to do well after discharge unless he is sent home with nocturnal positive pressure ventilation.  Needs an outpatient sleep study, which has been ordered previously. -Follow-up with Dr. Halford Chessman in sleep clinic  -CPAP QHS while inpatient  -Avoid sedating medications, inpatient and outpatient  Morbid Obesity  P: -Reinforced the  importance of a healthy diet and regular physical activity  HTN  P: Per primary   Best practice:  Diet: Per Primary  Pain/Anxiety/Delirium protocol (if indicated): n/a  VAP protocol (if indicated): n/a  DVT prophylaxis: per primary  GI prophylaxis: n/a  Glucose control: per primary  Mobility: as tolerated  Code Status: Full Code  Family Communication: Updated the patient and his mother via phone Disposition: Per primary.  PCCM will follow with you.  Thank you for the consultation.   Labs   CBC: Recent Labs  Lab 06/07/19 1648 06/07/19 2042 06/08/19 0717 06/09/19 0409 06/10/19 1000 06/11/19 0211  WBC 12.8* 15.9* 15.5* 15.3* 14.4* 11.1*  NEUTROABS 7.5  --   --   --  9.4*  --   HGB 15.2 14.6 14.4 14.4 13.2 13.1  HCT 49.2 48.2 47.7 47.8 43.5 43.1  MCV 83.7 84.1 84.7 85.2 84.0 85.0  PLT 445* 422* 423* 438* 429* 735    Basic Metabolic Panel: Recent Labs  Lab 06/07/19 1648 06/07/19  2042 06/08/19 0717 06/09/19 0409 06/10/19 1000 06/11/19 0211  NA 140  --  135 139 138 138  K 3.9  --  5.1 4.3 3.3* 3.4*  CL 105  --  104 105 106 106  CO2 26  --  19* 25 21* 24  GLUCOSE 120*  --  238* 151* 110* 108*  BUN 13  --  14 18 17 16   CREATININE 1.08 0.96 0.84 0.84 0.90 1.04  CALCIUM 8.9  --  8.8* 8.6* 7.8* 8.7*   GFR: Estimated Creatinine Clearance: 204.3 mL/min (by C-G formula based on SCr of 1.04 mg/dL). Recent Labs  Lab 06/07/19 1754 06/07/19 1936  06/08/19 0717 06/09/19 0409 06/10/19 1000 06/11/19 0211 06/11/19 1445 06/12/19 0559  PROCALCITON  --   --   --   --   --   --   --  <0.10 <0.10  WBC  --   --    < > 15.5* 15.3* 14.4* 11.1*  --   --   LATICACIDVEN 1.5 1.5  --   --   --   --   --   --   --    < > = values in this interval not displayed.    Liver Function Tests: Recent Labs  Lab 06/08/19 0717  AST 22  ALT 30  ALKPHOS 63  BILITOT 0.6  PROT 7.5  ALBUMIN 3.7   No results for input(s): LIPASE, AMYLASE in the last 168 hours. No results for input(s):  AMMONIA in the last 168 hours.  ABG    Component Value Date/Time   PHART 7.404 06/10/2019 1209   PCO2ART 40.8 06/10/2019 1209   PO2ART 56.7 (L) 06/10/2019 1209   HCO3 25.0 06/10/2019 1209   O2SAT 88.2 06/10/2019 1209     Coagulation Profile: No results for input(s): INR, PROTIME in the last 168 hours.  Cardiac Enzymes: No results for input(s): CKTOTAL, CKMB, CKMBINDEX, TROPONINI in the last 168 hours.  HbA1C: Hgb A1c MFr Bld  Date/Time Value Ref Range Status  06/08/2019 01:50 PM 6.5 (H) 4.8 - 5.6 % Final    Comment:    (NOTE) Pre diabetes:          5.7%-6.4% Diabetes:              >6.4% Glycemic control for   <7.0% adults with diabetes   11/25/2018 08:02 AM 6.3 (H) 4.8 - 5.6 % Final    Comment:    (NOTE) Pre diabetes:          5.7%-6.4% Diabetes:              >6.4% Glycemic control for   <7.0% adults with diabetes     CBG: Recent Labs  Lab 06/08/19 0801 06/08/19 1656 06/08/19 2137 06/09/19 0742 06/09/19 1111  GLUCAP 191* 257* 334* 84 164*      Steffanie DunnLaura P Ahmar Pickrell, DO 06/12/19 2:07 PM [facility]

## 2019-06-12 NOTE — Progress Notes (Signed)
Initial Nutrition Assessment  DOCUMENTATION CODES:   Morbid obesity  INTERVENTION:  - will order 30 ml prostat TID, each supplement provides 100 kcal and 15 grams protein.  *recommend consult for outpatient nutrition/diet counseling with a RD.    NUTRITION DIAGNOSIS:   Food and nutrition related knowledge deficit related to other (see comment)(emotional eating, significant weight gain, morbid obesity) as evidenced by other (comment), per patient/family report(BMI >40).  GOAL:   Other (Comment)(weight loss, improved eating habits)  MONITOR:   PO intake, Supplement acceptance, Labs, Weight trends  REASON FOR ASSESSMENT:   Consult Assessment of nutrition requirement/status  ASSESSMENT:   18 year old male with medical history of morbid obesity, paraseptal emphysema seen on CT chest of unclear etiology, suspected sleep apnea, hospitalized for pneumonia 9/27-9/29 and discharged home on 3L oxygen. He returned to the ED on 9/30 due to worsening SOB, inability to sleep d/t SOB. He was admitted for worsened acute hypoxic respiratory failure.  Per flow sheet, patient consumed 100% of all meals yesterday (total of 3326 kcal, 109 grams protein). Patient denies increase in SOB when eating or drinking. He reports that he has often turned to food to numb his emotions or when he is feeling stressed/overwhelmed.   RD recommends outpatient nutrition counseling as working through emotional eating habits/patterns is an ongoing process and cannot adequately or appropriately be handled in an acute setting. Especially in light of focus on respiratory status.   Per chart review, current weight is 438 lb, weight on 9/24 was 430 lb, and weight on 11/28/18 was 407 lb. 10   Labs reviewed; K: 3.4 mmol/l, Ca: 8.7 mg/dl. Medications reviewed; 75 mcg oral synthroid/day, 40 mEq Klor-Con x2 doses 10/1.     NUTRITION - FOCUSED PHYSICAL EXAM:  completed; no muscle or fat wasting, mild edema to BLE.    Diet Order:   Diet Order            Diet regular Room service appropriate? Yes; Fluid consistency: Thin  Diet effective now              EDUCATION NEEDS:   Not appropriate for education at this time  Skin:  Skin Assessment: Reviewed RN Assessment  Last BM:  10/1  Height:   Ht Readings from Last 1 Encounters:  06/10/19 6\' 1"  (1.854 m)    Weight:   Wt Readings from Last 1 Encounters:  06/10/19 (!) 198.8 kg    Ideal Body Weight:  83.6 kg  BMI:  Body mass index is 57.82 kg/m.  Estimated Nutritional Needs:   Kcal:  2400-2600 kcal  Protein:  125-135 grams  Fluid:  >/= 2.5 L/day      Jarome Matin, MS, RD, LDN, [facility] [place] Inpatient Clinical Dietitian Pager # [phone] After hours/weekend pager # [phone]

## 2019-06-12 NOTE — Progress Notes (Signed)
PROGRESS NOTE  Benjamin Rosario JYN:[id number] DOB: [birth year]   PCP: Tracey Harries, MD  Patient is from: Home  DOA: 06/10/2019 LOS: 1  Brief Narrative / Interim history: 18 year old man PMH morbid obesity, paraseptal emphysema seen on CT chest of unclear etiology, suspected sleep apnea, hospitalized for pneumonia 9/27-9/29 and discharged home on 3 L oxygen returned 9/30 to [facility] ED for worsening shortness of breath, inability to sleep at home secondary to shortness of breath.  Admitted for worsened acute hypoxic respiratory failure.  In ED, hypoxic to 87% requiring 4 L to maintain saturation in upper 80s to lower 90s.  Tachypneic.  Started on ceftriaxone and azithromycin.  Pulmonology consulted and admitted.  Patient improved on nightly CPAP.  High-resolution CT obtained and revealed extensive heterogeneous airspace disease in the left lower lobe, and a moderate paraseptal and centrilobular emphysema predominantly involving right lung (advanced for age) with significant volume loss, and extensive bronchial wall thickening throughout the right lung.  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  Subjective: No major events overnight of this morning.  Spend the night on CPAP.  No complaint this morning.  Denies chest pain, dyspnea, cough, GI or GU symptoms.  Objective: Vitals:   06/11/19 1951 06/11/19 2202 06/12/19 0651 06/12/19 1321  BP:  (!) 149/79 (!) 147/87 (!) 145/85  Pulse:  96 74 93  Resp:  Temp:  (!) 97.4 F (36.3 C) 97.8 F (36.6 C) (!) 97.5 F (36.4 C)  TempSrc:  Oral Oral Oral  SpO2: 96% 91% 100% 94%  Weight:      Height:        Intake/Output Summary (Last 24 hours) at 06/12/2019 1511 Last data filed at 06/12/2019 1300 Gross per 24 hour  Intake 720 ml  Output 1 ml  Net 719 ml   Filed Weights   06/10/19 0938 06/10/19 1816  Weight: (!) 197.3 kg (!) 198.8 kg    Examination:  GENERAL: No acute distress.  Lying with CPAP on. HEENT:  MMM.  Vision and hearing grossly intact.  NECK: Supple.  No apparent JVD.  RESP:  No IWOB. Good air movement bilaterally. CVS:  RRR. Heart sounds normal.  ABD/GI/GU: Bowel sounds present. Soft. Non tender.  MSK/EXT:  Moves extremities. No apparent deformity or edema.  SKIN: no apparent skin lesion or wound NEURO: Awake, alert and oriented appropriately.  No gross deficit.  PSYCH: Calm. Normal affect.   Assessment & Plan: Acute/subacute respiratory failure with hypoxia: suspect OSA and possible OHS.  Could be due to residual left lung pneumonia.  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  Procalcitonin is negative.  Lower extremity Doppler negative for DVT.  PERC score is 0 for PE. -Encouraged to quit smoking marijuana. -Ceftriaxone and azithromycin through 10/5 which will complete 8 days course from his previous hospitalization. -PCCM recommend outpatient follow-up for left lung infiltrate and a sleep study. -Nightly CPAP without oxygen tonight per pulmonology -Needs outpatient sleep study. -Encourage lifestyle change to lose weight. -PRN breathing treatments -Wean oxygen as able -Check alpha antitrypsin  Morbid obesity: at risk for OSA/OHS.  BMI 57.82. -Encourage lifestyle change to lose weight. -Continue nightly CPAP.  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  CBG within fair range. -We will continue monitoring -Could benefit from metformin.  Hypokalemia -Replenish and recheck.  Leukocytosis: Improved.  Marijuana use -Encourage cessation.  Hypothyroidism: Recent thyroid panel within normal range. -Continue home Synthroid.  Elevated blood pressure slight sinus tachycardia: No history of hypertension. -Start amlodipine -PRN labetalol  DVT prophylaxis: SCD Code Status: Full code Family Communication: None at  bedside. Disposition Plan: Remains inpatient Consultants: Pulmonology  Procedures:  None  Microbiology summarized: COVID-19 negative. MRSA PCR screen negative. Blood cultures negative so far.  Antimicrobials: Anti-infectives (From admission, onward)   Start     Dose/Rate Route Frequency Ordered Stop   06/11/19 1000  azithromycin (ZITHROMAX) 500 mg in sodium chloride 0.9 % 250 mL IVPB     500 mg 250 mL/hr over 60 Minutes Intravenous Every 24 hours 06/10/19 1737 06/15/19 0959   06/10/19 2200  cefTRIAXone (ROCEPHIN) 1 g in sodium chloride 0.9 % 100 mL IVPB     1 g 200 mL/hr over 30 Minutes Intravenous Every 24 hours 06/10/19 1737 06/15/19 2159   06/10/19 1000  azithromycin (ZITHROMAX) tablet 500 mg     500 mg Oral  Once 06/10/19 0955 06/10/19 1020   06/10/19 1000  cefdinir (OMNICEF) capsule 300 mg  Status:  Discontinued     300 mg Oral Every 12 hours 06/10/19 0955 06/10/19 1737      Sch Meds:  Scheduled Meds:  amLODipine  10 mg Oral Daily   Chlorhexidine Gluconate Cloth  6 each Topical Daily   feeding supplement (PRO-STAT SUGAR FREE 64)  30 mL Oral TID   Ipratropium-Albuterol  2 puff Inhalation BID   levothyroxine  75 mcg Oral Q0600   sodium chloride flush  3 mL Intravenous Q12H   sodium chloride flush  3 mL Intravenous Q12H   Continuous Infusions:  sodium chloride     azithromycin 500 mg (06/12/19 0959)   cefTRIAXone (ROCEPHIN)  IV 1 g (06/11/19 2210)   PRN Meds:.sodium chloride, acetaminophen **OR** acetaminophen, albuterol, labetalol, ondansetron **OR** ondansetron (ZOFRAN) IV, sodium chloride flush   I have personally reviewed the following labs and images: CBC: Recent Labs  Lab 06/07/19 1648 06/07/19 2042 06/08/19 0717 06/09/19 0409 06/10/19 1000 06/11/19 0211  WBC 12.8* 15.9* 15.5* 15.3* 14.4* 11.1*  NEUTROABS 7.5  --   --   --  9.4*  --   HGB 15.2 14.6 14.4 14.4 13.2 13.1  HCT 49.2 48.2 47.7 47.8 43.5 43.1  MCV 83.7 84.1 84.7 85.2 84.0 85.0    PLT 445* 422* 423* 438* 429* 391   BMP &GFR Recent Labs  Lab 06/07/19 1648 06/07/19 2042 06/08/19 0717 06/09/19 0409 06/10/19 1000 06/11/19 0211  NA 140  --  135 139 138 138  K 3.9  --  5.1 4.3 3.3* 3.4*  CL 105  --  104 105 106 106  CO2 26  --  19* 25 21* 24  GLUCOSE 120*  --  238* 151* 110* 108*  BUN 13  --  14 18 17 16   CREATININE 1.08 0.96 0.84 0.84 0.90 1.04  CALCIUM 8.9  --  8.8* 8.6* 7.8* 8.7*   Estimated Creatinine Clearance: 204.3 mL/min (by C-G formula based on SCr of 1.04 mg/dL). Liver & Pancreas: Recent Labs  Lab 06/08/19 0717  AST 22  ALT 30  ALKPHOS 63  BILITOT 0.6  PROT 7.5  ALBUMIN 3.7   No results for input(s): LIPASE, AMYLASE in the last 168 hours. No results for input(s): AMMONIA in the last 168 hours. Diabetic: No results for input(s): HGBA1C in the last 72 hours. Recent Labs  Lab 06/08/19 0801 06/08/19  1656 06/08/19 2137 06/09/19 0742 06/09/19 1111  GLUCAP 191* 257* 334* 84 164*   Cardiac Enzymes: No results for input(s): CKTOTAL, CKMB, CKMBINDEX, TROPONINI in the last 168 hours. No results for input(s): PROBNP in the last 8760 hours. Coagulation Profile: No results for input(s): INR, PROTIME in the last 168 hours. Thyroid Function Tests: No results for input(s): TSH, T4TOTAL, FREET4, T3FREE, THYROIDAB in the last 72 hours. Lipid Profile: No results for input(s): CHOL, HDL, LDLCALC, TRIG, CHOLHDL, LDLDIRECT in the last 72 hours. Anemia Panel: No results for input(s): VITAMINB12, FOLATE, FERRITIN, TIBC, IRON, RETICCTPCT in the last 72 hours. Urine analysis:    Component Value Date/Time   COLORURINE YELLOW 06/07/2019 [place] 06/07/2019 1754   LABSPEC 1.021 06/07/2019 1754   PHURINE 6.0 06/07/2019 1754   GLUCOSEU NEGATIVE 06/07/2019 1754   HGBUR NEGATIVE 06/07/2019 1754   BILIRUBINUR NEGATIVE 06/07/2019 1754   KETONESUR NEGATIVE 06/07/2019 1754   PROTEINUR 100 (A) 06/07/2019 1754   UROBILINOGEN 0.2 06/09/2013  0731   NITRITE NEGATIVE 06/07/2019 1754   LEUKOCYTESUR TRACE (A) 06/07/2019 1754   Sepsis Labs: Invalid input(s): PROCALCITONIN, [place]  Microbiology: Recent Results (from the past 240 hour(s))  SARS Coronavirus 2 [facility] order, Performed in [facility] hospital lab) Nasopharyngeal Nasopharyngeal Swab     Status: None   Collection Time: 06/07/19  5:00 PM   Specimen: Nasopharyngeal Swab  Result Value Ref Range Status   SARS Coronavirus 2 NEGATIVE NEGATIVE Final    Comment: (NOTE) If result is NEGATIVE SARS-CoV-2 target nucleic acids are NOT DETECTED. The SARS-CoV-2 RNA is generally detectable in upper and lower  respiratory specimens during the acute phase of infection. The lowest  concentration of SARS-CoV-2 viral copies this assay can detect is 250  copies / mL. A negative result does not preclude SARS-CoV-2 infection  and should not be used as the sole basis for treatment or other  patient management decisions.  A negative result may occur with  improper specimen collection / handling, submission of specimen other  than nasopharyngeal swab, presence of viral mutation(s) within the  areas targeted by this assay, and inadequate number of viral copies  (<250 copies / mL). A negative result must be combined with clinical  observations, patient history, and epidemiological information. If result is POSITIVE SARS-CoV-2 target nucleic acids are DETECTED. The SARS-CoV-2 RNA is generally detectable in upper and lower  respiratory specimens dur ing the acute phase of infection.  Positive  results are indicative of active infection with SARS-CoV-2.  Clinical  correlation with patient history and other diagnostic information is  necessary to determine patient infection status.  Positive results do  not rule out bacterial infection or co-infection with other viruses. If result is PRESUMPTIVE POSTIVE SARS-CoV-2 nucleic acids MAY BE PRESENT.   A presumptive positive result was  obtained on the submitted specimen  and confirmed on repeat testing.  While 2019 novel coronavirus  (SARS-CoV-2) nucleic acids may be present in the submitted sample  additional confirmatory testing may be necessary for epidemiological  and / or clinical management purposes  to differentiate between  SARS-CoV-2 and other Sarbecovirus currently known to infect humans.  If clinically indicated additional testing with an alternate test  methodology [id number]) is advised. The SARS-CoV-2 RNA is generally  detectable in upper and lower respiratory sp ecimens during the acute  phase of infection. The expected result is Negative. Fact Sheet for Patients:  StrictlyIdeas.no Fact Sheet for Healthcare Providers: BankingDealers.co.za This test is not yet  approved or cleared by the [place] and has been authorized for detection and/or diagnosis of SARS-CoV-2 by FDA under an Emergency Use Authorization (EUA).  This EUA will remain in effect (meaning this test can be used) for the duration of the COVID-19 declaration under Section 564(b)(1) of the Act, 21 U.S.C. section 360bbb-3(b)(1), unless the authorization is terminated or revoked sooner. Performed at [facility], [address], [place], [place] [address]   Urine culture     Status: Abnormal   Collection Time: 06/07/19  5:54 PM   Specimen: In/Out Cath Urine  Result Value Ref Range Status   Specimen Description   Final    IN/OUT CATH URINE Performed at [facility], [address] [place], [place] [address]    Special Requests   Final    NONE Performed at [facility], [address], [place], [place] [address]    Culture 2,000 COLONIES/mL STAPHYLOCOCCUS HAEMOLYTICUS (A)  Final   Report Status 06/10/2019 FINAL  Final   Organism ID, Bacteria STAPHYLOCOCCUS HAEMOLYTICUS (A)  Final      Susceptibility   Staphylococcus  haemolyticus - MIC*    CIPROFLOXACIN <=0.5 SENSITIVE Sensitive     GENTAMICIN <=0.5 SENSITIVE Sensitive     NITROFURANTOIN <=16 SENSITIVE Sensitive     OXACILLIN <=0.25 SENSITIVE Sensitive     TETRACYCLINE <=1 SENSITIVE Sensitive     VANCOMYCIN <=0.5 SENSITIVE Sensitive     TRIMETH/SULFA <=10 SENSITIVE Sensitive     CLINDAMYCIN <=0.25 SENSITIVE Sensitive     RIFAMPIN <=0.5 SENSITIVE Sensitive     Inducible Clindamycin NEGATIVE Sensitive     * 2,000 COLONIES/mL STAPHYLOCOCCUS HAEMOLYTICUS  Blood Culture (routine x 2)     Status: None   Collection Time: 06/07/19  5:55 PM   Specimen: BLOOD LEFT ARM  Result Value Ref Range Status   Specimen Description   Final    BLOOD LEFT ARM Performed at [facility], [address] [place], [place] [address]    Special Requests   Final    BOTTLES DRAWN AEROBIC AND ANAEROBIC Blood Culture adequate volume Performed at [facility], [address], [place], [place] [address]    Culture   Final    NO GROWTH 5 DAYS Performed at [facility], [address], [place], [place] [address]    Report Status 06/12/2019 FINAL  Final  Blood Culture (routine x 2)     Status: None   Collection Time: 06/07/19  7:37 PM   Specimen: BLOOD  Result Value Ref Range Status   Specimen Description   Final    BLOOD RIGHT ANTECUBITAL Performed at [facility], [address] [place], [place] [address]    Special Requests   Final    BOTTLES DRAWN AEROBIC AND ANAEROBIC Blood Culture adequate volume Performed at [facility], [address], [place], [place] [address]    Culture   Final    NO GROWTH 5 DAYS Performed at [facility], [address], [place], [place] [address]    Report Status 06/12/2019 FINAL  Final  SARS Coronavirus 2 [facility] order, Performed in [facility] hospital lab) Nasopharyngeal Nasopharyngeal Swab     Status: None   Collection Time: 06/10/19 10:26 AM   Specimen:  Nasopharyngeal Swab  Result Value Ref Range Status   SARS Coronavirus 2 NEGATIVE NEGATIVE Final    Comment: (NOTE) If result is NEGATIVE SARS-CoV-2 target nucleic  acids are NOT DETECTED. The SARS-CoV-2 RNA is generally detectable in upper and lower  respiratory specimens during the acute phase of infection. The lowest  concentration of SARS-CoV-2 viral copies this assay can detect is 250  copies / mL. A negative result does not preclude SARS-CoV-2 infection  and should not be used as the sole basis for treatment or other  patient management decisions.  A negative result may occur with  improper specimen collection / handling, submission of specimen other  than nasopharyngeal swab, presence of viral mutation(s) within the  areas targeted by this assay, and inadequate number of viral copies  (<250 copies / mL). A negative result must be combined with clinical  observations, patient history, and epidemiological information. If result is POSITIVE SARS-CoV-2 target nucleic acids are DETECTED. The SARS-CoV-2 RNA is generally detectable in upper and lower  respiratory specimens dur ing the acute phase of infection.  Positive  results are indicative of active infection with SARS-CoV-2.  Clinical  correlation with patient history and other diagnostic information is  necessary to determine patient infection status.  Positive results do  not rule out bacterial infection or co-infection with other viruses. If result is PRESUMPTIVE POSTIVE SARS-CoV-2 nucleic acids MAY BE PRESENT.   A presumptive positive result was obtained on the submitted specimen  and confirmed on repeat testing.  While 2019 novel coronavirus  (SARS-CoV-2) nucleic acids may be present in the submitted sample  additional confirmatory testing may be necessary for epidemiological  and / or clinical management purposes  to differentiate between  SARS-CoV-2 and other Sarbecovirus currently known to infect humans.  If clinically  indicated additional testing with an alternate test  methodology [phone]) is advised. The SARS-CoV-2 RNA is generally  detectable in upper and lower respiratory sp ecimens during the acute  phase of infection. The expected result is Negative. Fact Sheet for Patients:  BoilerBrush.com.cy Fact Sheet for Healthcare Providers: https://pope.com/ This test is not yet approved or cleared by the [place] FDA and has been authorized for detection and/or diagnosis of SARS-CoV-2 by FDA under an Emergency Use Authorization (EUA).  This EUA will remain in effect (meaning this test can be used) for the duration of the COVID-19 declaration under Section 564(b)(1) of the Act, 21 U.S.C. section 360bbb-3(b)(1), unless the authorization is terminated or revoked sooner. Performed at [facility], [address], [place], [place] [address]   Culture, sputum-assessment     Status: None   Collection Time: 06/10/19  5:30 PM   Specimen: Sputum  Result Value Ref Range Status   Specimen Description SPU  Final   Special Requests NONE  Final   Sputum evaluation   Final    Sputum specimen not acceptable for testing.  Please recollect.   Performed at [facility], [address], [place], [place] [address]    Report Status 06/10/2019 FINAL  Final  Culture, blood (routine x 2) Call MD if unable to obtain prior to antibiotics being given     Status: None (Preliminary result)   Collection Time: 06/10/19  6:10 PM   Specimen: BLOOD RIGHT HAND  Result Value Ref Range Status   Specimen Description   Final    BLOOD RIGHT HAND Performed at [facility], [address], [place], [place] [address]    Special Requests   Final    BOTTLES DRAWN AEROBIC ONLY Blood Culture results may not be optimal due to an inadequate volume of blood received in culture  bottles Performed at [facility], [address] [place], [place] [address]    Culture   Final    NO GROWTH 2 DAYS Performed at [facility], [address] [place], [place] [address]    Report Status PENDING  Incomplete  Culture, blood (routine x 2) Call MD if unable to obtain prior to antibiotics being given     Status: None (Preliminary result)   Collection Time: 06/10/19  6:15 PM   Specimen: BLOOD  Result Value Ref Range Status   Specimen Description   Final    BLOOD LEFT ANTECUBITAL Performed at [facility], [address] [place], [place] [address]    Special Requests   Final    BOTTLES DRAWN AEROBIC ONLY Blood Culture results may not be optimal due to an inadequate volume of blood received in culture bottles Performed at [facility], [address] [place], [place] [address]    Culture   Final    NO GROWTH 2 DAYS Performed at [facility], [address] [place], [place] [address]    Report Status PENDING  Incomplete  MRSA PCR Screening     Status: None   Collection Time: 06/10/19 11:33 PM   Specimen: Nasopharyngeal  Result Value Ref Range Status   MRSA by PCR NEGATIVE NEGATIVE Final    Comment:        The GeneXpert MRSA Assay (FDA approved for NASAL specimens only), is one component of a comprehensive MRSA colonization surveillance program. It is not intended to diagnose MRSA infection nor to guide or monitor treatment for MRSA infections. Performed at [facility], [address] [place], [place] [address]     Radiology Studies: No results found.  Lacrystal Barbe T. Javohn Basey [facility]  If 7PM-7AM, please contact night-coverage [email or web address] Password [email or web address] 06/12/2019, 3:11 PM

## 2019-06-12 NOTE — Telephone Encounter (Signed)
Patient still currently admitted.  

## 2019-06-13 LAB — PROCALCITONIN: Procalcitonin: <0.1 ng/mL

## 2019-06-13 NOTE — Progress Notes (Addendum)
RT Note: Pts. Pulse Oximetry check obtained while on CPAP, reading 98% while on room air, Pt. able to arouse easily, with no c/o.

## 2019-06-13 NOTE — Progress Notes (Signed)
PROGRESS NOTE  Benjamin Rosario TKW:[id number] DOB: [birth year]   PCP: Tracey Harries, MD  Patient is from: Home  DOA: 06/10/2019 LOS: 2  Brief Narrative / Interim history: 18 year old man PMH morbid obesity, paraseptal emphysema seen on CT chest of unclear etiology, suspected sleep apnea, hospitalized for pneumonia 9/27-9/29 and discharged home on 3 L oxygen returned 9/30 to [facility] ED for worsening shortness of breath, inability to sleep at home secondary to shortness of breath.  Admitted for worsened acute hypoxic respiratory failure.  In ED, hypoxic to 87% requiring 4 L to maintain saturation in upper 80s to lower 90s.  Tachypneic.  Started on ceftriaxone and azithromycin.  Pulmonology consulted and admitted.  Patient improved on nightly CPAP.  High-resolution CT obtained and revealed extensive heterogeneous airspace disease in the left lower lobe, and a moderate paraseptal and centrilobular emphysema predominantly involving right lung (advanced for age) with significant volume loss, and extensive bronchial wall thickening throughout the right lung.  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  Remained stable with good saturation on CPAP without oxygen.  Waiting on pulmonology to arrange home CPAP before discharge.  Subjective: No major events overnight of this morning.  Maintain good saturation on CPAP without oxygen overnight.  Currently on room air.  No complaints.  He called his pulmonologist office about CPAP.  Waiting on call back.  Objective: Vitals:   06/13/19 0450 06/13/19 0753 06/13/19 0755 06/13/19 1339  BP: (!) 150/99   (!) 152/100  Pulse: 83   85  Resp: 20   19  Temp: 97.8 F (36.6 C)   97.9 F (36.6 C)  TempSrc: Oral   Oral  SpO2: 95% 98% 98% 98%  Weight:      Height:        Intake/Output Summary (Last 24 hours) at 06/13/2019 1457 Last data filed at 06/13/2019 1330 Gross per 24 hour  Intake 1182.04 ml  Output 1 ml  Net 1181.04 ml   Filed  Weights   06/10/19 0938 06/10/19 1816  Weight: (!) 197.3 kg (!) 198.8 kg    Examination:  GENERAL: No acute distress.  Appears well.  HEENT: MMM.  Vision and hearing grossly intact.  NECK: Supple.  No apparent JVD.  RESP:  No IWOB. Good air movement bilaterally. CVS:  RRR. Heart sounds normal.  ABD/GI/GU: Bowel sounds present. Soft. Non tender.  MSK/EXT:  Moves extremities. No apparent deformity or edema.  SKIN: no apparent skin lesion or wound NEURO: Awake, alert and oriented appropriately.  No gross deficit.  PSYCH: Calm. Normal affect.   Assessment & Plan: Acute/subacute respiratory failure with hypoxia: suspect OSA and possible OHS.  Could be due to residual left lung pneumonia.  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  Alpha-1 antitrypsin was normal.  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  Procalcitonin is negative.  Lower extremity Doppler negative for DVT.  PERC score is 0 for PE. -Encouraged to quit smoking marijuana. -Ceftriaxone and azithromycin through 10/5 which will complete 8 days course from his previous hospitalization. -PCCM recommend outpatient follow-up for left lung infiltrate and a sleep study. -Needs home CPAP before discharge.   -Needs outpatient sleep study. -Encourage lifestyle change to lose weight. -PRN breathing treatments  Morbid obesity: at risk for OSA/OHS.  BMI 57.82. -Encourage lifestyle change to lose weight. -Continue nightly CPAP.  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  CBG within fair range. -We will continue monitoring -Could benefit from metformin.  Hypokalemia: Replenished.  Leukocytosis: Improved.  Marijuana use -Encourage cessation.  Hypothyroidism: Recent thyroid panel within normal range. -Continue home Synthroid.  Elevated blood pressure slight sinus tachycardia: No history of  hypertension. -Continue amlodipine 10 mg daily. -Add low-dose hydrochlorothiazide. -PRN labetalol  DVT prophylaxis: SCD Code Status: Full code Family Communication: None at bedside. Disposition Plan: Remains inpatient until he has home CPAP Consultants: Pulmonology  Procedures:  None  Microbiology summarized: COVID-19 negative. MRSA PCR screen negative. Blood cultures negative so far.  Antimicrobials: Anti-infectives (From admission, onward)   Start     Dose/Rate Route Frequency Ordered Stop   06/11/19 1000  azithromycin (ZITHROMAX) 500 mg in sodium chloride 0.9 % 250 mL IVPB     500 mg 250 mL/hr over 60 Minutes Intravenous Every 24 hours 06/10/19 1737 06/15/19 0959   06/10/19 2200  cefTRIAXone (ROCEPHIN) 1 g in sodium chloride 0.9 % 100 mL IVPB     1 g 200 mL/hr over 30 Minutes Intravenous Every 24 hours 06/10/19 1737 06/15/19 2159   06/10/19 1000  azithromycin (ZITHROMAX) tablet 500 mg     500 mg Oral  Once 06/10/19 0955 06/10/19 1020   06/10/19 1000  cefdinir (OMNICEF) capsule 300 mg  Status:  Discontinued     300 mg Oral Every 12 hours 06/10/19 0955 06/10/19 1737      Sch Meds:  Scheduled Meds:  amLODipine  10 mg Oral Daily   Chlorhexidine Gluconate Cloth  6 each Topical Daily   feeding supplement (PRO-STAT SUGAR FREE 64)  30 mL Oral TID   Ipratropium-Albuterol  2 puff Inhalation BID   levothyroxine  75 mcg Oral Q0600   sodium chloride flush  3 mL Intravenous Q12H   sodium chloride flush  3 mL Intravenous Q12H   Continuous Infusions:  sodium chloride     azithromycin 500 mg (06/13/19 0958)   cefTRIAXone (ROCEPHIN)  IV Stopped (06/12/19 2217)   PRN Meds:.sodium chloride, acetaminophen **OR** acetaminophen, albuterol, labetalol, ondansetron **OR** ondansetron (ZOFRAN) IV, sodium chloride flush   I have personally reviewed the following labs and images: CBC: Recent Labs  Lab 06/07/19 1648 06/07/19 2042 06/08/19 0717 06/09/19 0409 06/10/19 1000  06/11/19 0211  WBC 12.8* 15.9* 15.5* 15.3* 14.4* 11.1*  NEUTROABS 7.5  --   --   --  9.4*  --   HGB 15.2 14.6 14.4 14.4 13.2 13.1  HCT 49.2 48.2 47.7 47.8 43.5 43.1  MCV 83.7 84.1 84.7 85.2 84.0 85.0  PLT 445* 422* 423* 438* 429* 391   BMP &GFR Recent Labs  Lab 06/07/19 1648 06/07/19 2042 06/08/19 0717 06/09/19 0409 06/10/19 1000 06/11/19 0211  NA 140  --  135 139 138 138  K 3.9  --  5.1 4.3 3.3* 3.4*  CL 105  --  104 105 106 106  CO2 26  --  19* 25 21* 24  GLUCOSE 120*  --  238* 151* 110* 108*  BUN 13  --  14 18 17 16   CREATININE 1.08 0.96 0.84 0.84 0.90 1.04  CALCIUM 8.9  --  8.8* 8.6* 7.8* 8.7*   Estimated Creatinine Clearance: 204.3 mL/min (by C-G formula based on SCr of 1.04 mg/dL). Liver & Pancreas: Recent Labs  Lab 06/08/19 0717  AST 22  ALT 30  ALKPHOS 63  BILITOT 0.6  PROT 7.5  ALBUMIN 3.7   No results for input(s): LIPASE, AMYLASE in the last 168 hours. No  results for input(s): AMMONIA in the last 168 hours. Diabetic: No results for input(s): HGBA1C in the last 72 hours. Recent Labs  Lab 06/08/19 0801 06/08/19 1656 06/08/19 2137 06/09/19 0742 06/09/19 1111  GLUCAP 191* 257* 334* 84 164*   Cardiac Enzymes: No results for input(s): CKTOTAL, CKMB, CKMBINDEX, TROPONINI in the last 168 hours. No results for input(s): PROBNP in the last 8760 hours. Coagulation Profile: No results for input(s): INR, PROTIME in the last 168 hours. Thyroid Function Tests: No results for input(s): TSH, T4TOTAL, FREET4, T3FREE, THYROIDAB in the last 72 hours. Lipid Profile: No results for input(s): CHOL, HDL, LDLCALC, TRIG, CHOLHDL, LDLDIRECT in the last 72 hours. Anemia Panel: No results for input(s): VITAMINB12, FOLATE, FERRITIN, TIBC, IRON, RETICCTPCT in the last 72 hours. Urine analysis:    Component Value Date/Time   COLORURINE YELLOW 06/07/2019 1754   APPEARANCEUR CLEAR 06/07/2019 1754   LABSPEC 1.021 06/07/2019 1754   PHURINE 6.0 06/07/2019 1754   GLUCOSEU  NEGATIVE 06/07/2019 1754   HGBUR NEGATIVE 06/07/2019 1754   BILIRUBINUR NEGATIVE 06/07/2019 1754   KETONESUR NEGATIVE 06/07/2019 1754   PROTEINUR 100 (A) 06/07/2019 1754   UROBILINOGEN 0.2 06/09/2013 0731   NITRITE NEGATIVE 06/07/2019 1754   LEUKOCYTESUR TRACE (A) 06/07/2019 1754   Sepsis Labs: Invalid input(s): PROCALCITONIN, LACTICIDVEN  Microbiology: Recent Results (from the past 240 hour(s))  SARS Coronavirus 2 [facility] order, Performed in [facility] hospital lab) Nasopharyngeal Nasopharyngeal Swab     Status: None   Collection Time: 06/07/19  5:00 PM   Specimen: Nasopharyngeal Swab  Result Value Ref Range Status   SARS Coronavirus 2 NEGATIVE NEGATIVE Final    Comment: (NOTE) If result is NEGATIVE SARS-CoV-2 target nucleic acids are NOT DETECTED. The SARS-CoV-2 RNA is generally detectable in upper and lower  respiratory specimens during the acute phase of infection. The lowest  concentration of SARS-CoV-2 viral copies this assay can detect is 250  copies / mL. A negative result does not preclude SARS-CoV-2 infection  and should not be used as the sole basis for treatment or other  patient management decisions.  A negative result may occur with  improper specimen collection / handling, submission of specimen other  than nasopharyngeal swab, presence of viral mutation(s) within the  areas targeted by this assay, and inadequate number of viral copies  (<250 copies / mL). A negative result must be combined with clinical  observations, patient history, and epidemiological information. If result is POSITIVE SARS-CoV-2 target nucleic acids are DETECTED. The SARS-CoV-2 RNA is generally detectable in upper and lower  respiratory specimens dur ing the acute phase of infection.  Positive  results are indicative of active infection with SARS-CoV-2.  Clinical  correlation with patient history and other diagnostic information is  necessary to determine patient infection status.   Positive results do  not rule out bacterial infection or co-infection with other viruses. If result is PRESUMPTIVE POSTIVE SARS-CoV-2 nucleic acids MAY BE PRESENT.   A presumptive positive result was obtained on the submitted specimen  and confirmed on repeat testing.  While 2019 novel coronavirus  (SARS-CoV-2) nucleic acids may be present in the submitted sample  additional confirmatory testing may be necessary for epidemiological  and / or clinical management purposes  to differentiate between  SARS-CoV-2 and other Sarbecovirus currently known to infect humans.  If clinically indicated additional testing with an alternate test  methodology [phone]) is advised. The SARS-CoV-2 RNA is generally  detectable in upper and lower respiratory sp ecimens during the  acute  phase of infection. The expected result is Negative. Fact Sheet for Patients:  BoilerBrush.com.cy Fact Sheet for Healthcare Providers: https://pope.com/ This test is not yet approved or cleared by the [place] FDA and has been authorized for detection and/or diagnosis of SARS-CoV-2 by FDA under an Emergency Use Authorization (EUA).  This EUA will remain in effect (meaning this test can be used) for the duration of the COVID-19 declaration under Section 564(b)(1) of the Act, 21 U.S.C. section 360bbb-3(b)(1), unless the authorization is terminated or revoked sooner. Performed at [facility], [address], [place], [place] [address]   Urine culture     Status: Abnormal   Collection Time: 06/07/19  5:54 PM   Specimen: In/Out Cath Urine  Result Value Ref Range Status   Specimen Description   Final    IN/OUT CATH URINE Performed at [facility], [address] [place], [place] [address]    Special Requests   Final    NONE Performed at [facility], [address], [place], [place] [address]    Culture 2,000  COLONIES/mL STAPHYLOCOCCUS HAEMOLYTICUS (A)  Final   Report Status 06/10/2019 FINAL  Final   Organism ID, Bacteria STAPHYLOCOCCUS HAEMOLYTICUS (A)  Final      Susceptibility   Staphylococcus haemolyticus - MIC*    CIPROFLOXACIN <=0.5 SENSITIVE Sensitive     GENTAMICIN <=0.5 SENSITIVE Sensitive     NITROFURANTOIN <=16 SENSITIVE Sensitive     OXACILLIN <=0.25 SENSITIVE Sensitive     TETRACYCLINE <=1 SENSITIVE Sensitive     VANCOMYCIN <=0.5 SENSITIVE Sensitive     TRIMETH/SULFA <=10 SENSITIVE Sensitive     CLINDAMYCIN <=0.25 SENSITIVE Sensitive     RIFAMPIN <=0.5 SENSITIVE Sensitive     Inducible Clindamycin NEGATIVE Sensitive     * 2,000 COLONIES/mL STAPHYLOCOCCUS HAEMOLYTICUS  Blood Culture (routine x 2)     Status: None   Collection Time: 06/07/19  5:55 PM   Specimen: BLOOD LEFT ARM  Result Value Ref Range Status   Specimen Description   Final    BLOOD LEFT ARM Performed at [facility], [address] [place], [place] [address]    Special Requests   Final    BOTTLES DRAWN AEROBIC AND ANAEROBIC Blood Culture adequate volume Performed at [facility], [address], [place], [place] [address]    Culture   Final    NO GROWTH 5 DAYS Performed at [facility], [address], [place], [place] [address]    Report Status 06/12/2019 FINAL  Final  Blood Culture (routine x 2)     Status: None   Collection Time: 06/07/19  7:37 PM   Specimen: BLOOD  Result Value Ref Range Status   Specimen Description   Final    BLOOD RIGHT ANTECUBITAL Performed at [facility], [address] [place], [place] [address]    Special Requests   Final    BOTTLES DRAWN AEROBIC AND ANAEROBIC Blood Culture adequate volume Performed at [facility], [address], [place], [place] [address]    Culture   Final    NO GROWTH 5 DAYS Performed at [facility], [address], [place], [place] [address]    Report Status 06/12/2019  FINAL  Final  SARS Coronavirus 2 [facility] order, Performed in [facility] hospital lab) Nasopharyngeal Nasopharyngeal Swab     Status: None   Collection Time: 06/10/19 10:26 AM   Specimen: Nasopharyngeal  Swab  Result Value Ref Range Status   SARS Coronavirus 2 NEGATIVE NEGATIVE Final    Comment: (NOTE) If result is NEGATIVE SARS-CoV-2 target nucleic acids are NOT DETECTED. The SARS-CoV-2 RNA is generally detectable in upper and lower  respiratory specimens during the acute phase of infection. The lowest  concentration of SARS-CoV-2 viral copies this assay can detect is 250  copies / mL. A negative result does not preclude SARS-CoV-2 infection  and should not be used as the sole basis for treatment or other  patient management decisions.  A negative result may occur with  improper specimen collection / handling, submission of specimen other  than nasopharyngeal swab, presence of viral mutation(s) within the  areas targeted by this assay, and inadequate number of viral copies  (<250 copies / mL). A negative result must be combined with clinical  observations, patient history, and epidemiological information. If result is POSITIVE SARS-CoV-2 target nucleic acids are DETECTED. The SARS-CoV-2 RNA is generally detectable in upper and lower  respiratory specimens dur ing the acute phase of infection.  Positive  results are indicative of active infection with SARS-CoV-2.  Clinical  correlation with patient history and other diagnostic information is  necessary to determine patient infection status.  Positive results do  not rule out bacterial infection or co-infection with other viruses. If result is PRESUMPTIVE POSTIVE SARS-CoV-2 nucleic acids MAY BE PRESENT.   A presumptive positive result was obtained on the submitted specimen  and confirmed on repeat testing.  While 2019 novel coronavirus  (SARS-CoV-2) nucleic acids may be present in the submitted sample  additional confirmatory  testing may be necessary for epidemiological  and / or clinical management purposes  to differentiate between  SARS-CoV-2 and other Sarbecovirus currently known to infect humans.  If clinically indicated additional testing with an alternate test  methodology [phone]) is advised. The SARS-CoV-2 RNA is generally  detectable in upper and lower respiratory sp ecimens during the acute  phase of infection. The expected result is Negative. Fact Sheet for Patients:  StrictlyIdeas.no Fact Sheet for Healthcare Providers: BankingDealers.co.za This test is not yet approved or cleared by the [place] FDA and has been authorized for detection and/or diagnosis of SARS-CoV-2 by FDA under an Emergency Use Authorization (EUA).  This EUA will remain in effect (meaning this test can be used) for the duration of the COVID-19 declaration under Section 564(b)(1) of the Act, 21 U.S.C. section 360bbb-3(b)(1), unless the authorization is terminated or revoked sooner. Performed at [facility], [place] [address] [place], [place] [address]   Culture, sputum-assessment     Status: None   Collection Time: 06/10/19  5:30 PM   Specimen: Sputum  Result Value Ref Range Status   Specimen Description SPU  Final   Special Requests NONE  Final   Sputum evaluation   Final    Sputum specimen not acceptable for testing.  Please recollect.   Performed at [facility], [place] [address] [place], [place] [address]    Report Status 06/10/2019 FINAL  Final  Culture, blood (routine x 2) Call MD if unable to obtain prior to antibiotics being given     Status: None (Preliminary result)   Collection Time: 06/10/19  6:10 PM   Specimen: BLOOD RIGHT HAND  Result Value Ref Range Status   Specimen Description   Final    BLOOD RIGHT HAND Performed at [place] [address] [place], [place] [address]    Special Requests  Final    BOTTLES DRAWN AEROBIC ONLY Blood Culture results may not be optimal due to an inadequate volume of blood received in culture bottles Performed at [facility], [address] [place], [place] [address]    Culture   Final    NO GROWTH 3 DAYS Performed at [facility], [address], [place], [place] [address]    Report Status PENDING  Incomplete  Culture, blood (routine x 2) Call MD if unable to obtain prior to antibiotics being given     Status: None (Preliminary result)   Collection Time: 06/10/19  6:15 PM   Specimen: BLOOD  Result Value Ref Range Status   Specimen Description   Final    BLOOD LEFT ANTECUBITAL Performed at [facility], [address] [place], [place] [address]    Special Requests   Final    BOTTLES DRAWN AEROBIC ONLY Blood Culture results may not be optimal due to an inadequate volume of blood received in culture bottles Performed at [facility], [address] [place], [place] [address]    Culture   Final    NO GROWTH 3 DAYS Performed at [facility], [address] [place], [place] [address]    Report Status PENDING  Incomplete  MRSA PCR Screening     Status: None   Collection Time: 06/10/19 11:33 PM   Specimen: Nasopharyngeal  Result Value Ref Range Status   MRSA by PCR NEGATIVE NEGATIVE Final    Comment:        The GeneXpert MRSA Assay (FDA approved for NASAL specimens only), is one component of a comprehensive MRSA colonization surveillance program. It is not intended to diagnose MRSA infection nor to guide or monitor treatment for MRSA infections. Performed at [facility], [address] [place], [place] [address]     Radiology Studies: No results found.  Benjamin Rosario T. Benjamin Rosario [facility]  If 7PM-7AM, please contact night-coverage [email or web address] Password [email or web address] 06/13/2019, 2:57 PM

## 2019-06-13 NOTE — Progress Notes (Addendum)
Oxygen charted at 95% on  RA With CPAP.

## 2019-06-14 MED ORDER — HYDROCHLOROTHIAZIDE 12.5 MG PO CAPS
12.5000 mg | ORAL_CAPSULE | Freq: Every day | ORAL | Status: DC
  Administered 2019-06-14 – 2019-06-15 (×2): 12.5 mg via ORAL
  Filled 2019-06-14 (×2): qty 1

## 2019-06-14 NOTE — Progress Notes (Signed)
Girlfriend visiting this afternoon

## 2019-06-14 NOTE — Progress Notes (Signed)
PROGRESS NOTE  Benjamin Rosario JXB:[id number] DOB: 09-28-2000   PCP: Tracey Harries, MD  Patient is from: Home  DOA: 06/10/2019 LOS: 3  Brief Narrative / Interim history: 18 year old man PMH morbid obesity, paraseptal emphysema seen on CT chest of unclear etiology, suspected sleep apnea, hospitalized for pneumonia 9/27-9/29 and discharged home on 3 L oxygen returned 9/30 to [facility] ED for worsening shortness of breath, inability to sleep at home secondary to shortness of breath.  Admitted for worsened acute hypoxic respiratory failure.  In ED, hypoxic to 87% requiring 4 L to maintain saturation in upper 80s to lower 90s.  Tachypneic.  Started on ceftriaxone and azithromycin.  Pulmonology consulted and admitted.  Patient improved on nightly CPAP.  High-resolution CT obtained and revealed extensive heterogeneous airspace disease in the left lower lobe, and a moderate paraseptal and centrilobular emphysema predominantly involving right lung (advanced for age) with significant volume loss, and extensive bronchial wall thickening throughout the right lung.  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  Remained stable with good saturation on CPAP without oxygen.  Waiting on pulmonology to arrange home CPAP before discharge.  Subjective: No major events overnight of this morning.  No complaints morning.  Spend the night on CPAP without desaturation.  On room air this morning.  Objective: Vitals:   06/14/19 0617 06/14/19 0801 06/14/19 0803 06/14/19 1112  BP: (!) 141/57   (!) 153/91  Pulse:      Resp:      Temp:      TempSrc:      SpO2:  97% 97%   Weight:      Height:        Intake/Output Summary (Last 24 hours) at 06/14/2019 1413 Last data filed at 06/14/2019 0642 Gross per 24 hour  Intake 900.5 ml  Output -  Net 900.5 ml   Filed Weights   06/10/19 0938 06/10/19 1816  Weight: (!) 197.3 kg (!) 198.8 kg    Examination:  GENERAL: No acute distress.  Appears  well.  HEENT: MMM.  Vision and hearing grossly intact.  NECK: Supple.  No apparent JVD.  RESP:  No IWOB. Good air movement bilaterally. CVS:  RRR. Heart sounds normal.  ABD/GI/GU: Bowel sounds present. Soft. Non tender.  MSK/EXT:  Moves extremities. No apparent deformity or edema.  SKIN: no apparent skin lesion or wound NEURO: Awake, alert and oriented appropriately.  No gross deficit.  PSYCH: Calm. Normal affect.  Assessment & Plan: Acute/subacute respiratory failure with hypoxia: suspect OSA and possible OHS.  Could be due to residual left lung pneumonia.  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  Alpha-1 antitrypsin was normal.  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  Procalcitonin is negative.  Lower extremity Doppler negative for DVT.  PERC score is 0 for PE. -Encouraged to quit smoking marijuana. -Ceftriaxone and azithromycin through 10/5 which will complete 8 days course from his previous hospitalization. -PCCM recommend outpatient follow-up for left lung infiltrate and a sleep study. -Needs home CPAP before discharge-hopefully tomorrow.   -Needs outpatient sleep study. -Encourage lifestyle change to lose weight. -PRN breathing treatments  Morbid obesity: at risk for OSA/OHS.  BMI 57.82. -Encourage lifestyle change to lose weight. -Continue nightly CPAP.  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  CBG within fair range. -We will continue monitoring -Could benefit from metformin.  Hypokalemia: Replenished.  Leukocytosis:  Improved.  Marijuana use -Encourage cessation.  Hypothyroidism: Recent thyroid panel within normal range. -Continue home Synthroid.  Elevated blood pressure slight sinus tachycardia: No history of hypertension. -Continue amlodipine 10 mg daily. -Added low-dose hydrochlorothiazide. -PRN labetalol  DVT prophylaxis: SCD Code  Status: Full code Family Communication: None at bedside. Disposition Plan: Remains inpatient until he has home CPAP-likely home 10/5. Consultants: Pulmonology  Procedures:  None  Microbiology summarized: COVID-19 negative. MRSA PCR screen negative. Blood cultures negative so far.  Antimicrobials: Anti-infectives (From admission, onward)   Start     Dose/Rate Route Frequency Ordered Stop   06/11/19 1000  azithromycin (ZITHROMAX) 500 mg in sodium chloride 0.9 % 250 mL IVPB     500 mg 250 mL/hr over 60 Minutes Intravenous Every 24 hours 06/10/19 1737 06/14/19 1211   06/10/19 2200  cefTRIAXone (ROCEPHIN) 1 g in sodium chloride 0.9 % 100 mL IVPB     1 g 200 mL/hr over 30 Minutes Intravenous Every 24 hours 06/10/19 1737 06/15/19 2159   06/10/19 1000  azithromycin (ZITHROMAX) tablet 500 mg     500 mg Oral  Once 06/10/19 0955 06/10/19 1020   06/10/19 1000  cefdinir (OMNICEF) capsule 300 mg  Status:  Discontinued     300 mg Oral Every 12 hours 06/10/19 0955 06/10/19 1737      Sch Meds:  Scheduled Meds: . amLODipine  10 mg Oral Daily  . Chlorhexidine Gluconate Cloth  6 each Topical Daily  . feeding supplement (PRO-STAT SUGAR FREE 64)  30 mL Oral TID  . Ipratropium-Albuterol  2 puff Inhalation BID  . levothyroxine  75 mcg Oral Q0600  . sodium chloride flush  3 mL Intravenous Q12H  . sodium chloride flush  3 mL Intravenous Q12H   Continuous Infusions: . sodium chloride    . cefTRIAXone (ROCEPHIN)  IV Stopped (06/13/19 2244)   PRN Meds:.sodium chloride, acetaminophen **OR** acetaminophen, albuterol, labetalol, ondansetron **OR** ondansetron (ZOFRAN) IV, sodium chloride flush   I have personally reviewed the following labs and images: CBC: Recent Labs  Lab 06/07/19 1648 06/07/19 2042 06/08/19 0717 06/09/19 0409 06/10/19 1000 06/11/19 0211  WBC 12.8* 15.9* 15.5* 15.3* 14.4* 11.1*  NEUTROABS 7.5  --   --   --  9.4*  --   HGB 15.2 14.6 14.4 14.4 13.2 13.1  HCT 49.2 48.2 47.7  47.8 43.5 43.1  MCV 83.7 84.1 84.7 85.2 84.0 85.0  PLT 445* 422* 423* 438* 429* 391   BMP &GFR Recent Labs  Lab 06/07/19 1648 06/07/19 2042 06/08/19 0717 06/09/19 0409 06/10/19 1000 06/11/19 0211  NA 140  --  135 139 138 138  K 3.9  --  5.1 4.3 3.3* 3.4*  CL 105  --  104 105 106 106  CO2 26  --  19* 25 21* 24  GLUCOSE 120*  --  238* 151* 110* 108*  BUN 13  --  CREATININE 1.08 0.96 0.84 0.84 0.90 1.04  CALCIUM 8.9  --  8.8* 8.6* 7.8* 8.7*   Estimated Creatinine Clearance: 204.3 mL/min (by C-G formula based on SCr of 1.04 mg/dL). Liver & Pancreas: Recent Labs  Lab 06/08/19 0717  AST 22  ALT 30  ALKPHOS 63  BILITOT 0.6  PROT 7.5  ALBUMIN 3.7   No results for input(s): LIPASE, AMYLASE in the last 168 hours. No results for input(s): AMMONIA in the last 168 hours. Diabetic: No results for input(s): HGBA1C in the last 72 hours. Recent Labs  Lab  06/08/19 0801 06/08/19 1656 06/08/19 2137 06/09/19 0742 06/09/19 1111  GLUCAP 191* 257* 334* 84 164*   Cardiac Enzymes: No results for input(s): CKTOTAL, CKMB, CKMBINDEX, TROPONINI in the last 168 hours. No results for input(s): PROBNP in the last 8760 hours. Coagulation Profile: No results for input(s): INR, PROTIME in the last 168 hours. Thyroid Function Tests: No results for input(s): TSH, T4TOTAL, FREET4, T3FREE, THYROIDAB in the last 72 hours. Lipid Profile: No results for input(s): CHOL, HDL, LDLCALC, TRIG, CHOLHDL, LDLDIRECT in the last 72 hours. Anemia Panel: No results for input(s): VITAMINB12, FOLATE, FERRITIN, TIBC, IRON, RETICCTPCT in the last 72 hours. Urine analysis:    Component Value Date/Time   COLORURINE YELLOW 06/07/2019 1754   APPEARANCEUR CLEAR 06/07/2019 1754   LABSPEC 1.021 06/07/2019 1754   PHURINE 6.0 06/07/2019 1754   GLUCOSEU NEGATIVE 06/07/2019 1754   HGBUR NEGATIVE 06/07/2019 1754   BILIRUBINUR NEGATIVE 06/07/2019 1754   KETONESUR NEGATIVE 06/07/2019 1754   PROTEINUR 100  (A) 06/07/2019 1754   UROBILINOGEN 0.2 06/09/2013 0731   NITRITE NEGATIVE 06/07/2019 1754   LEUKOCYTESUR TRACE (A) 06/07/2019 1754   Sepsis Labs: Invalid input(s): PROCALCITONIN, LACTICIDVEN  Microbiology: Recent Results (from the past 240 hour(s))  SARS Coronavirus 2 [facility] order, Performed in [facility] hospital lab) Nasopharyngeal Nasopharyngeal Swab     Status: None   Collection Time: 06/07/19  5:00 PM   Specimen: Nasopharyngeal Swab  Result Value Ref Range Status   SARS Coronavirus 2 NEGATIVE NEGATIVE Final    Comment: (NOTE) If result is NEGATIVE SARS-CoV-2 target nucleic acids are NOT DETECTED. The SARS-CoV-2 RNA is generally detectable in upper and lower  respiratory specimens during the acute phase of infection. The lowest  concentration of SARS-CoV-2 viral copies this assay can detect is 250  copies / mL. A negative result does not preclude SARS-CoV-2 infection  and should not be used as the sole basis for treatment or other  patient management decisions.  A negative result may occur with  improper specimen collection / handling, submission of specimen other  than nasopharyngeal swab, presence of viral mutation(s) within the  areas targeted by this assay, and inadequate number of viral copies  (<250 copies / mL). A negative result must be combined with clinical  observations, patient history, and epidemiological information. If result is POSITIVE SARS-CoV-2 target nucleic acids are DETECTED. The SARS-CoV-2 RNA is generally detectable in upper and lower  respiratory specimens dur ing the acute phase of infection.  Positive  results are indicative of active infection with SARS-CoV-2.  Clinical  correlation with patient history and other diagnostic information is  necessary to determine patient infection status.  Positive results do  not rule out bacterial infection or co-infection with other viruses. If result is PRESUMPTIVE POSTIVE SARS-CoV-2 nucleic acids MAY BE  PRESENT.   A presumptive positive result was obtained on the submitted specimen  and confirmed on repeat testing.  While 2019 novel coronavirus  (SARS-CoV-2) nucleic acids may be present in the submitted sample  additional confirmatory testing may be necessary for epidemiological  and / or clinical management purposes  to differentiate between  SARS-CoV-2 and other Sarbecovirus currently known to infect humans.  If clinically indicated additional testing with an alternate test  methodology [phone]) is advised. The SARS-CoV-2 RNA is generally  detectable in upper and lower respiratory sp ecimens during the acute  phase of infection. The expected result is Negative. Fact Sheet for Patients:  BoilerBrush.com.cy Fact Sheet for Healthcare Providers: https://pope.com/ This test  is not yet approved or cleared by the [place] and has been authorized for detection and/or diagnosis of SARS-CoV-2 by FDA under an Emergency Use Authorization (EUA).  This EUA will remain in effect (meaning this test can be used) for the duration of the COVID-19 declaration under Section 564(b)(1) of the Act, 21 U.S.C. section 360bbb-3(b)(1), unless the authorization is terminated or revoked sooner. Performed at [facility], [place] [address] [place], [place] [address]   Urine culture     Status: Abnormal   Collection Time: 06/07/19  5:54 PM   Specimen: In/Out Cath Urine  Result Value Ref Range Status   Specimen Description   Final    IN/OUT CATH URINE Performed at [place] [address] [place], [place] [address]    Special Requests   Final    NONE Performed at [facility], [place] [address], [place], [place] [address]    Culture 2,000 COLONIES/mL STAPHYLOCOCCUS HAEMOLYTICUS (A)  Final   Report Status 06/10/2019 FINAL  Final   Organism ID, Bacteria STAPHYLOCOCCUS HAEMOLYTICUS (A)  Final       Susceptibility   Staphylococcus haemolyticus - MIC*    CIPROFLOXACIN <=0.5 SENSITIVE Sensitive     GENTAMICIN <=0.5 SENSITIVE Sensitive     NITROFURANTOIN <=16 SENSITIVE Sensitive     OXACILLIN <=0.25 SENSITIVE Sensitive     TETRACYCLINE <=1 SENSITIVE Sensitive     VANCOMYCIN <=0.5 SENSITIVE Sensitive     TRIMETH/SULFA <=10 SENSITIVE Sensitive     CLINDAMYCIN <=0.25 SENSITIVE Sensitive     RIFAMPIN <=0.5 SENSITIVE Sensitive     Inducible Clindamycin NEGATIVE Sensitive     * 2,000 COLONIES/mL STAPHYLOCOCCUS HAEMOLYTICUS  Blood Culture (routine x 2)     Status: None   Collection Time: 06/07/19  5:55 PM   Specimen: BLOOD LEFT ARM  Result Value Ref Range Status   Specimen Description   Final    BLOOD LEFT ARM Performed at [facility], [address], [place], [place] [address]    Special Requests   Final    BOTTLES DRAWN AEROBIC AND ANAEROBIC Blood Culture adequate volume Performed at [place] [address] [place], [place] [address]    Culture   Final    NO GROWTH 5 DAYS Performed at [facility], [place] [address] [place], [place] [address]    Report Status 06/12/2019 FINAL  Final  Blood Culture (routine x 2)     Status: None   Collection Time: 06/07/19  7:37 PM   Specimen: BLOOD  Result Value Ref Range Status   Specimen Description   Final    BLOOD RIGHT ANTECUBITAL Performed at [place] [address] [place], [place] [address]    Special Requests   Final    BOTTLES DRAWN AEROBIC AND ANAEROBIC Blood Culture adequate volume Performed at [place] [address] [place], [place] [address]    Culture   Final    NO GROWTH 5 DAYS Performed at [facility], [place] [address], [place], [place] [address]    Report Status 06/12/2019 FINAL  Final  SARS Coronavirus 2 [facility] order, Performed in [facility] hospital lab) Nasopharyngeal Nasopharyngeal Swab     Status: None   Collection Time:  06/10/19 10:26 AM   Specimen: Nasopharyngeal Swab  Result Value Ref Range Status   SARS Coronavirus 2 NEGATIVE NEGATIVE Final    Comment: (NOTE) If result is NEGATIVE  SARS-CoV-2 target nucleic acids are NOT DETECTED. The SARS-CoV-2 RNA is generally detectable in upper and lower  respiratory specimens during the acute phase of infection. The lowest  concentration of SARS-CoV-2 viral copies this assay can detect is 250  copies / mL. A negative result does not preclude SARS-CoV-2 infection  and should not be used as the sole basis for treatment or other  patient management decisions.  A negative result may occur with  improper specimen collection / handling, submission of specimen other  than nasopharyngeal swab, presence of viral mutation(s) within the  areas targeted by this assay, and inadequate number of viral copies  (<250 copies / mL). A negative result must be combined with clinical  observations, patient history, and epidemiological information. If result is POSITIVE SARS-CoV-2 target nucleic acids are DETECTED. The SARS-CoV-2 RNA is generally detectable in upper and lower  respiratory specimens dur ing the acute phase of infection.  Positive  results are indicative of active infection with SARS-CoV-2.  Clinical  correlation with patient history and other diagnostic information is  necessary to determine patient infection status.  Positive results do  not rule out bacterial infection or co-infection with other viruses. If result is PRESUMPTIVE POSTIVE SARS-CoV-2 nucleic acids MAY BE PRESENT.   A presumptive positive result was obtained on the submitted specimen  and confirmed on repeat testing.  While 2019 novel coronavirus  (SARS-CoV-2) nucleic acids may be present in the submitted sample  additional confirmatory testing may be necessary for epidemiological  and / or clinical management purposes  to differentiate between  SARS-CoV-2 and other Sarbecovirus currently known to  infect humans.  If clinically indicated additional testing with an alternate test  methodology [phone]) is advised. The SARS-CoV-2 RNA is generally  detectable in upper and lower respiratory sp ecimens during the acute  phase of infection. The expected result is Negative. Fact Sheet for Patients:  BoilerBrush.com.cy Fact Sheet for Healthcare Providers: https://pope.com/ This test is not yet approved or cleared by the [place] FDA and has been authorized for detection and/or diagnosis of SARS-CoV-2 by FDA under an Emergency Use Authorization (EUA).  This EUA will remain in effect (meaning this test can be used) for the duration of the COVID-19 declaration under Section 564(b)(1) of the Act, 21 U.S.C. section 360bbb-3(b)(1), unless the authorization is terminated or revoked sooner. Performed at [facility], [address], [place], [place] [address]   Culture, sputum-assessment     Status: None   Collection Time: 06/10/19  5:30 PM   Specimen: Sputum  Result Value Ref Range Status   Specimen Description SPU  Final   Special Requests NONE  Final   Sputum evaluation   Final    Sputum specimen not acceptable for testing.  Please recollect.   Performed at [facility], [address], [place], [place] [address]    Report Status 06/10/2019 FINAL  Final  Culture, blood (routine x 2) Call MD if unable to obtain prior to antibiotics being given     Status: None (Preliminary result)   Collection Time: 06/10/19  6:10 PM   Specimen: BLOOD RIGHT HAND  Result Value Ref Range Status   Specimen Description   Final    BLOOD RIGHT HAND Performed at [facility], [address] [place], [place] [address]    Special Requests   Final    BOTTLES DRAWN AEROBIC ONLY Blood Culture results may not be optimal due to an inadequate volume of blood  received in culture bottles Performed at [facility], [address] [place], [place] [address]    Culture   Final    NO GROWTH 4 DAYS Performed at [facility], [address] [place], [place] [address]    Report Status PENDING  Incomplete  Culture, blood (routine x 2) Call MD if unable to obtain prior to antibiotics being given     Status: None (Preliminary result)   Collection Time: 06/10/19  6:15 PM   Specimen: BLOOD  Result Value Ref Range Status   Specimen Description   Final    BLOOD LEFT ANTECUBITAL Performed at [facility], [address] [place], [place] [address]    Special Requests   Final    BOTTLES DRAWN AEROBIC ONLY Blood Culture results may not be optimal due to an inadequate volume of blood received in culture bottles Performed at [facility], [address] [place], [place] [address]    Culture   Final    NO GROWTH 4 DAYS Performed at [facility], [address] [place], [place] [address]    Report Status PENDING  Incomplete  MRSA PCR Screening     Status: None   Collection Time: 06/10/19 11:33 PM   Specimen: Nasopharyngeal  Result Value Ref Range Status   MRSA by PCR NEGATIVE NEGATIVE Final    Comment:        The GeneXpert MRSA Assay (FDA approved for NASAL specimens only), is one component of a comprehensive MRSA colonization surveillance program. It is not intended to diagnose MRSA infection nor to guide or monitor treatment for MRSA infections. Performed at [facility], [address] [place], [place] [address]     Radiology Studies: No results found.  Taye T. Gonfa [facility]  If 7PM-7AM, please contact night-coverage [email or web address] Password [email or web address] 06/14/2019, 2:12 PM

## 2019-06-15 ENCOUNTER — Telehealth: Payer: Self-pay | Admitting: Pulmonary Disease

## 2019-06-15 DIAGNOSIS — E1169 Type 2 diabetes mellitus with other specified complication: Secondary | ICD-10-CM

## 2019-06-15 DIAGNOSIS — I10 Essential (primary) hypertension: Secondary | ICD-10-CM

## 2019-06-15 LAB — BASIC METABOLIC PANEL
Anion gap: 8 (ref 5–15)
BUN: 9 mg/dL (ref 6–20)
CO2: 24 mmol/L (ref 22–32)
Calcium: 8.9 mg/dL (ref 8.9–10.3)
Chloride: 105 mmol/L (ref 98–111)
Creatinine, Ser: 0.74 mg/dL (ref 0.61–1.24)
GFR calc Af Amer: >60 mL/min (ref >60)
GFR calc non Af Amer: >60 mL/min (ref >60)
Glucose, Bld: 189 mg/dL — ABNORMAL HIGH (ref 70–99)
Potassium: 4.1 mmol/L (ref 3.5–5.1)
Sodium: 137 mmol/L (ref 135–145)

## 2019-06-15 LAB — CULTURE, BLOOD (ROUTINE X 2)
Culture: NO GROWTH
Culture: NO GROWTH

## 2019-06-15 LAB — CBC
HCT: 42.1 % (ref 39.0–52.0)
Hemoglobin: 12.9 g/dL — ABNORMAL LOW (ref 13.0–17.0)
MCH: 25.6 pg — ABNORMAL LOW (ref 26.0–34.0)
MCHC: 30.6 g/dL (ref 30.0–36.0)
MCV: 83.5 fL (ref 80.0–100.0)
Platelets: 375 10*3/uL (ref 150–400)
RBC: 5.04 MIL/uL (ref 4.22–5.81)
RDW: 15.6 % — ABNORMAL HIGH (ref 11.5–15.5)
WBC: 9.5 10*3/uL (ref 4.0–10.5)
nRBC: 0 % (ref 0.0–0.2)

## 2019-06-15 LAB — MAGNESIUM: Magnesium: 2.1 mg/dL (ref 1.7–2.4)

## 2019-06-15 MED ORDER — HYDROCHLOROTHIAZIDE 12.5 MG PO CAPS: 12.5000 mg | ORAL_CAPSULE | Freq: Every day | ORAL | 1 refills | Status: DC

## 2019-06-15 MED ORDER — AMLODIPINE BESYLATE 10 MG PO TABS: 10.0000 mg | ORAL_TABLET | Freq: Every day | ORAL | 1 refills | Status: AC

## 2019-06-15 NOTE — Telephone Encounter (Signed)
Spoke with Alinda Sierras  I advised that the pt will need to call us once he is d/c'ed to arrange for home sleep study machine to be picked up  There was some confusion over a loaner CPAP being picked up from [place] as well and I advised her that our office does not provide this service  She stated will have this set up through DME  Will advise pt to call once d/c to arrange for in home study  Nothing further needed at this time

## 2019-06-15 NOTE — Telephone Encounter (Signed)
Alinda Sierras calling from [facility], she is the case manger on Mr Dishman and is calling a/b  The CPAP he is needing to [place]  She states that pt is being discharged today give her a call @ [phone].Hillery Hunter

## 2019-06-15 NOTE — Telephone Encounter (Signed)
Pt calling in regards to needing a CPAP to go home with from the hospital.  [phone].

## 2019-06-15 NOTE — Telephone Encounter (Signed)
Benjamin Rosario tried to call the patient on 06/10/19 phone just rings no VM Can we have the patient call us once he gets out so we can schedule

## 2019-06-15 NOTE — Progress Notes (Addendum)
Discharge instructions explained to patient, denies having any questions. Next due medications written on discharge sheet. Case manager unable to get C-pap machine today, see her note. Girlfriend in to take patient home. Waiting for final OK from Dr. Cyndia Skeeters to discharge patient. Patient was given the choice to stay in the hospital overnight or he could go home today. Benjamin Rosario decided to go home today, Discharged via Geographical information systems officer

## 2019-06-15 NOTE — Telephone Encounter (Signed)
Patient is going to be discharged today .  Will route to [facility] to see if they have a machine for patient to pick up and do home sleep study per Dr. Halford Chessman.  [facility] please advise

## 2019-06-15 NOTE — Telephone Encounter (Signed)
Duplicate message. 

## 2019-06-15 NOTE — Discharge Summary (Signed)
Physician Discharge Summary  Rufus Beske ZOX:[id number] DOB: Jun 25, 2001 DOA: 06/10/2019  PCP: Tracey Harries, MD  Admit date: 06/10/2019 Discharge date: 06/15/2019  Admitted From: Home Disposition: Home  Recommendations for Outpatient Follow-up:  1. Follow up with PCP/pulmonologist in 1-2 weeks 2. Please obtain CBC/BMP/Mag at follow up 3. Please follow up on the following pending results: None  Home Health: None Equipment/Devices: Patient to schedule sleep study at pulmonologist office and obtain CPAP  Discharge Condition: Stable CODE STATUS: Full code  Hospital Course: 18 year old man PMH morbid obesity, paraseptal emphysema seen on CT chest of unclear etiology, suspected sleep apnea, hospitalized for pneumonia 9/27-9/29 and discharged home on 3 L oxygen returned 9/30 to [facility] ED for worsening shortness of breath, inability to sleep at home secondary to shortness of breath. Admitted for worsened acute hypoxic respiratory failure.  In ED, hypoxic to 87% requiring 4 L to maintain saturation in upper 80s to lower 90s.  Tachypneic.  Started on ceftriaxone and azithromycin.  Pulmonology consulted and admitted.  Patient improved on nightly CPAP.  High-resolution CT obtained and revealed extensive heterogeneous airspace disease in the left lower lobe, and a moderate paraseptal and centrilobular emphysema predominantly involving right lung (advanced for age) with significant volume loss, and extensive bronchial wall thickening throughout the right lung.  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  Remained stable with good saturation on CPAP without oxygen.    Patient is in contact with his pulmonologist office to arrange sleep study and obtain CPAP today.  If this can not be done, family to obtain rental CPAP until he gets his sleep study done.  Patient completed antibiotic course for 8 days total prior to discharge.  See individual problems below for more on  hospital course.  Discharge Diagnoses:  Acute/subacute respiratory failure with hypoxia: suspect OSA and possible OHS.  Could be due to residual left lung pneumonia.  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  Alpha-1 antitrypsin was normal.  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  Procalcitonin is negative.  Lower extremity Doppler negative for DVT.  PERC score is 0 for PE. -Encouraged to quit smoking marijuana. -Ceftriaxone and azithromycin through 10/5 which will complete 8 days course of antibiotics -PCCM recommend outpatient follow-up for left lung infiltrate and a sleep study. -His pulmonology office and family working on obtaining CPAP today. -Needs outpatient sleep study. -Encourage lifestyle change to lose weight. -Continue home Dulera and PRN albuterol.  Morbid obesity: at risk for OSA/OHS.  BMI 57.82. -Encourage lifestyle change to lose weight. -CPAP as above.  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  CBG within fair range. -Could benefit from metformin.  Hypokalemia: Replenished.  Leukocytosis: Resolved.  Marijuana use -Encouraged cessation.  Hypothyroidism: Recent thyroid panel within normal range. -Continue home Synthroid.  Elevated blood pressure slight sinus tachycardia: No history of hypertension. -Discharged on amlodipine and hydrochlorothiazide. -Check BMP at follow-up. -Encourage lifestyle change  Discharge Instructions  Discharge Instructions    Call MD for:   Complete by: As directed    Shortness of breath.   Call MD for:  temperature >100.4   Complete by: As directed    Diet - low sodium heart healthy   Complete by: As directed    Discharge instructions   Complete by: As directed    It has been a pleasure taking care of you! You were admitted  with shortness of breath which is likely due to sleep apnea and possible  lung infection.  You were treated with antibiotics for the lung infection.  You will need to continue wearing CPAP at night for the sleep apnea.  We have also started you on blood pressure medication for high blood pressure.  It is very important that you take your medications as prescribed. Please review your new medication list and the directions before you take your medications. Please call your primary care office and your pulmonologist as soon as possible to schedule hospital follow-up visit in 1 to 2 weeks.  Take care,   Increase activity slowly   Complete by: As directed      Allergies as of 06/15/2019   No Known Allergies     Medication List    STOP taking these medications   azithromycin 500 MG tablet Commonly known as: Zithromax   cefpodoxime 200 MG tablet Commonly known as: VANTIN     TAKE these medications   albuterol 108 (90 Base) MCG/ACT inhaler Commonly known as: VENTOLIN HFA Inhale 2 puffs into the lungs every 6 (six) hours as needed for wheezing or shortness of breath.   amLODipine 10 MG tablet Commonly known as: NORVASC Take 1 tablet (10 mg total) by mouth daily.   hydrochlorothiazide 12.5 MG capsule Commonly known as: MICROZIDE Take 1 capsule (12.5 mg total) by mouth daily.   levothyroxine 75 MCG tablet Commonly known as: SYNTHROID Take 1 tablet (75 mcg total) by mouth daily before breakfast.   mometasone-formoterol 100-5 MCG/ACT Aero Commonly known as: DULERA Inhale 2 puffs into the lungs 2 (two) times daily.            Durable Medical Equipment  (From admission, onward)         Start     Ordered   06/11/19 1146  For home use only DME continuous positive airway pressure (CPAP)  Once    Question Answer Comment  Length of Need Lifetime   Patient has OSA or probable OSA Yes   Is the patient currently using CPAP in the home No   If no (to question two) date of sleep study Home sleep study pending   Settings Autotitration   Signs and  symptoms of probable OSA  (select all that apply) Snoring   Signs and symptoms of probable OSA  (select all that apply) Moring headaches   Signs and symptoms of probable OSA  (select all that apply) Witnessed apneas   Signs and symptoms of probable OSA  (select all that apply) Choking   Signs and symptoms of probable OSA  (select all that apply) Gasping during sleep   CPAP supplies needed Mask, headgear, cushions, filters, heated tubing and water chamber      06/11/19 1146         Follow-up Information    Tracey HarriesBouska, David, MD. Schedule an appointment as soon as possible for a visit in 2 day(s).   Specialty: Family Medicine Contact information: [address] [place] [id number] [phone]        Waymon BudgeYoung, Clinton D, MD. Schedule an appointment as soon as possible for a visit in 1 week(s).   Specialty: Pulmonary Disease Contact information: [address] [place] [address] [phone]           Consultations:  Pulmonology  Procedures/Studies:  2D Echo: None  Ct Chest High Resolution  Result Date: 06/11/2019 CLINICAL DATA:  Emphysema, shortness of breath, pneumonia,  wheezing, cough EXAM: CT CHEST WITHOUT CONTRAST TECHNIQUE: Multidetector CT imaging of the chest was performed following the standard protocol without intravenous contrast. High resolution imaging of the lungs, as well as inspiratory and expiratory imaging, was performed. 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 Otherwise normal configuration of the great vessels with the exception of incidental variant origin of the left vertebral artery from the aortic arch. Normal heart size. No pericardial effusion. Mediastinum/Nodes: Prominent mediastinal lymph nodes, likely reactive. Age-appropriate thymic remnant in the anterior mediastinum. Thyroid gland, trachea, and esophagus  demonstrate no significant findings. Lungs/Pleura: There is extensive heterogeneous airspace disease in the left lower lobe. There are scattered small pulmonary nodules of the left upper lobe, likely residua of prior infection seen on CT dated 11/25/2018 and measuring 4 mm or smaller (e.g. series 11, image 45, 41). There is moderate paraseptal and centrilobular emphysema predominantly involving the right lung and right upper lobe, with significant volume loss of the right lung and extensive bronchial wall thickening throughout. No pleural effusion or pneumothorax. Upper Abdomen: No acute abnormality. Musculoskeletal: No chest wall mass or suspicious bone lesions identified. IMPRESSION: 1. There is extensive heterogeneous airspace disease in the left lower lobe, consistent with infection. 2. There is moderate paraseptal and centrilobular emphysema predominantly involving the right lung and right upper lobe, unusually advanced for patient age and with significant volume loss of the right lung. Extensive bronchial wall thickening throughout the right lung. 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 This constellation of findings has been reported in the literature and may present with recurrent infection and hemoptysis. Emphysema (ICD10-J43.9). 3. There are scattered small pulmonary nodules of the left upper lobe, likely residua of prior infection seen on CT dated 11/25/2018 and measuring 4 mm or smaller (e.g. series 11, image 45, 41). Electronically Signed   By: Lauralyn Primes M.D.   On: 06/11/2019 08:27   Dg Chest Port 1 View  Result Date: 06/10/2019 CLINICAL DATA:  Cough.  Shortness of breath. EXAM: PORTABLE CHEST 1 VIEW COMPARISON:  06/07/2019 FINDINGS: There is a persistent left lower lung zone infiltrate with some improvement since the prior study. The heart size is essentially stable from prior study. There is no pneumothorax. No large pleural effusion. No pneumothorax. IMPRESSION:  Persistent but improving left lower lung zone infiltrate. Electronically Signed   By: Katherine Mantle M.D.   On: 06/10/2019 10:19   Dg Chest Port 1 View  Result Date: 06/07/2019 CLINICAL DATA:  Shortness of breath EXAM: PORTABLE CHEST 1 VIEW COMPARISON:  Jan 25, 2019 FINDINGS: No pneumothorax. The right lung is clear. There is opacity in the left mid lower lung. The cardiomediastinal silhouette is stable with cardiomegaly. No pneumothorax. IMPRESSION: Infiltrate in left base worrisome for pneumonia. Atypical edema considered less likely. Recommend follow-up imaging to ensure complete resolution. No other changes. Electronically Signed   By: Gerome Sam III M.D   On: 06/07/2019 17:46   Vas [place] Lower Extremity Venous (dvt)  Result Date: 06/11/2019  Lower Venous Study Indications: Swelling.  Risk Factors: None identified. Limitations: Body habitus and poor ultrasound/tissue interface. Comparison Study: No prior studies. Performing Technologist: Chanda Busing RVT  Examination Guidelines: A complete evaluation includes B-mode imaging, spectral Doppler, color Doppler, and power Doppler as needed of all accessible portions of each vessel. Bilateral testing is considered an integral part of a complete examination. Limited examinations for reoccurring indications may be performed  as noted.  +---------+---------------+---------+-----------+----------+--------------+ RIGHT    CompressibilityPhasicitySpontaneityPropertiesThrombus Aging +---------+---------------+---------+-----------+----------+--------------+ CFV      Full           Yes      Yes                                 +---------+---------------+---------+-----------+----------+--------------+ SFJ      Full                                                        +---------+---------------+---------+-----------+----------+--------------+ FV Prox  Full                                                         +---------+---------------+---------+-----------+----------+--------------+ FV Mid   Full                                                        +---------+---------------+---------+-----------+----------+--------------+ FV Distal               Yes      Yes                                 +---------+---------------+---------+-----------+----------+--------------+ PFV      Full                                                        +---------+---------------+---------+-----------+----------+--------------+ POP      Full           Yes      Yes                                 +---------+---------------+---------+-----------+----------+--------------+ PTV      Full                                                        +---------+---------------+---------+-----------+----------+--------------+ PERO                                                  Not visualized +---------+---------------+---------+-----------+----------+--------------+   +---------+---------------+---------+-----------+----------+--------------+ LEFT     CompressibilityPhasicitySpontaneityPropertiesThrombus Aging +---------+---------------+---------+-----------+----------+--------------+ CFV      Full           Yes      Yes                                 +---------+---------------+---------+-----------+----------+--------------+  SFJ      Full                                                        +---------+---------------+---------+-----------+----------+--------------+ FV Prox  Full                                                        +---------+---------------+---------+-----------+----------+--------------+ FV Mid   Full                                                        +---------+---------------+---------+-----------+----------+--------------+ FV DistalFull                                                         +---------+---------------+---------+-----------+----------+--------------+ PFV      Full                                                        +---------+---------------+---------+-----------+----------+--------------+ POP      Full           Yes      Yes                                 +---------+---------------+---------+-----------+----------+--------------+ PTV      Full                                                        +---------+---------------+---------+-----------+----------+--------------+ PERO                                                  Not visualized +---------+---------------+---------+-----------+----------+--------------+     Summary: Right: There is no evidence of deep vein thrombosis in the lower extremity. However, portions of this examination were limited- see technologist comments above. No cystic structure found in the popliteal fossa. Left: There is no evidence of deep vein thrombosis in the lower extremity. However, portions of this examination were limited- see technologist comments above. No cystic structure found in the popliteal fossa.  *See table(s) above for measurements and observations. Electronically signed by Lemar Livings MD on 06/11/2019 at 2:14:12 PM.    Final    Dg Esophagus W Single Cm (sol Or Thin Ba)  Result Date: 06/11/2019 CLINICAL DATA:  Evaluate for aspiration.  Recurrent pneumonia. EXAM: ESOPHOGRAM/BARIUM SWALLOW TECHNIQUE:  Single contrast examination was performed using  thin barium. FLUOROSCOPY TIME:  Fluoroscopy Time:  1 minutes and 48 seconds Radiation Exposure Index (if provided by the fluoroscopic device): 56.5 Number of Acquired Spot Images: 0 COMPARISON:  Chest CT 06/10/2019 FINDINGS: Focused, single-contrast exam performed secondary to patient immobility and body habitus. Exam was performed only in LPO position. Evaluation of esophageal peristalsis demonstrates a normal primary peristaltic wave on 2 swallows. Full column  evaluation of the esophagus, including on series 5, demonstrates no persistent narrowing to suggest stricture. No hiatal hernia. IMPRESSION: 1. Limited, focused exam secondary to patient body habitus and immobility, as detailed above. 2. No evidence of esophageal dysmotility or hernia. No findings to predispose to aspiration. Electronically Signed   By: Jeronimo Greaves M.D.   On: 06/11/2019 09:57     Subjective: No major events overnight of this morning.  Has no complaints.  In contact with his pulmonologist office.  Plan is to reschedule sleep study and obtain CPAP from pulmonologist office on the way home.  If this does not work, plan to obtain rental CPAP.    Discharge Exam: Vitals:   06/15/19 0914 06/15/19 1047  BP:  137/90  Pulse:    Resp:    Temp:    SpO2: 95%     GENERAL: No acute distress.  Appears well.  HEENT: MMM.  Vision and hearing grossly intact.  NECK: Supple.  No JVD.  LUNGS:  No IWOB. Good air movement bilaterally. HEART:  RRR. Heart sounds normal.  ABD: Bowel sounds present. Soft. Non tender.  MSK/EXT:  Moves all extremities. No apparent deformity. No edema bilaterally.  SKIN: no apparent skin lesion or wound NEURO: Awake, alert and oriented appropriately.  No gross deficit.  PSYCH: Calm. Normal affect.     The results of significant diagnostics from this hospitalization (including imaging, microbiology, ancillary and laboratory) are listed below for reference.     Microbiology: Recent Results (from the past 240 hour(s))  SARS Coronavirus 2 [facility] order, Performed in [facility] hospital lab) Nasopharyngeal Nasopharyngeal Swab     Status: None   Collection Time: 06/07/19  5:00 PM   Specimen: Nasopharyngeal Swab  Result Value Ref Range Status   SARS Coronavirus 2 NEGATIVE NEGATIVE Final    Comment: (NOTE) If result is NEGATIVE SARS-CoV-2 target nucleic acids are NOT DETECTED. The SARS-CoV-2 RNA is generally detectable in upper and lower  respiratory  specimens during the acute phase of infection. The lowest  concentration of SARS-CoV-2 viral copies this assay can detect is 250  copies / mL. A negative result does not preclude SARS-CoV-2 infection  and should not be used as the sole basis for treatment or other  patient management decisions.  A negative result may occur with  improper specimen collection / handling, submission of specimen other  than nasopharyngeal swab, presence of viral mutation(s) within the  areas targeted by this assay, and inadequate number of viral copies  (<250 copies / mL). A negative result must be combined with clinical  observations, patient history, and epidemiological information. If result is POSITIVE SARS-CoV-2 target nucleic acids are DETECTED. The SARS-CoV-2 RNA is generally detectable in upper and lower  respiratory specimens dur ing the acute phase of infection.  Positive  results are indicative of active infection with SARS-CoV-2.  Clinical  correlation with patient history and other diagnostic information is  necessary to determine patient infection status.  Positive results do  not rule out bacterial infection or co-infection with other viruses.  If result is PRESUMPTIVE POSTIVE SARS-CoV-2 nucleic acids MAY BE PRESENT.   A presumptive positive result was obtained on the submitted specimen  and confirmed on repeat testing.  While 2019 novel coronavirus  (SARS-CoV-2) nucleic acids may be present in the submitted sample  additional confirmatory testing may be necessary for epidemiological  and / or clinical management purposes  to differentiate between  SARS-CoV-2 and other Sarbecovirus currently known to infect humans.  If clinically indicated additional testing with an alternate test  methodology [phone]) is advised. The SARS-CoV-2 RNA is generally  detectable in upper and lower respiratory sp ecimens during the acute  phase of infection. The expected result is Negative. Fact Sheet for  Patients:  BoilerBrush.com.cy Fact Sheet for Healthcare Providers: https://pope.com/ This test is not yet approved or cleared by the [place] FDA and has been authorized for detection and/or diagnosis of SARS-CoV-2 by FDA under an Emergency Use Authorization (EUA).  This EUA will remain in effect (meaning this test can be used) for the duration of the COVID-19 declaration under Section 564(b)(1) of the Act, 21 U.S.C. section 360bbb-3(b)(1), unless the authorization is terminated or revoked sooner. Performed at [facility], [address] [place], [place] [address]   Urine culture     Status: Abnormal   Collection Time: 06/07/19  5:54 PM   Specimen: In/Out Cath Urine  Result Value Ref Range Status   Specimen Description   Final    IN/OUT CATH URINE Performed at [facility], [address], [place], [place] [address]    Special Requests   Final    NONE Performed at [facility], [address] [place], [place] [address]    Culture 2,000 COLONIES/mL STAPHYLOCOCCUS HAEMOLYTICUS (A)  Final   Report Status 06/10/2019 FINAL  Final   Organism ID, Bacteria STAPHYLOCOCCUS HAEMOLYTICUS (A)  Final      Susceptibility   Staphylococcus haemolyticus - MIC*    CIPROFLOXACIN <=0.5 SENSITIVE Sensitive     GENTAMICIN <=0.5 SENSITIVE Sensitive     NITROFURANTOIN <=16 SENSITIVE Sensitive     OXACILLIN <=0.25 SENSITIVE Sensitive     TETRACYCLINE <=1 SENSITIVE Sensitive     VANCOMYCIN <=0.5 SENSITIVE Sensitive     TRIMETH/SULFA <=10 SENSITIVE Sensitive     CLINDAMYCIN <=0.25 SENSITIVE Sensitive     RIFAMPIN <=0.5 SENSITIVE Sensitive     Inducible Clindamycin NEGATIVE Sensitive     * 2,000 COLONIES/mL STAPHYLOCOCCUS HAEMOLYTICUS  Blood Culture (routine x 2)     Status: None   Collection Time: 06/07/19  5:55 PM   Specimen: BLOOD LEFT ARM  Result Value Ref Range Status   Specimen  Description   Final    BLOOD LEFT ARM Performed at [facility], [address] [place], [place] [address]    Special Requests   Final    BOTTLES DRAWN AEROBIC AND ANAEROBIC Blood Culture adequate volume Performed at [facility], [address] [place], [place] [address]    Culture   Final    NO GROWTH 5 DAYS Performed at [facility], [address], [place], [place] [address]    Report Status 06/12/2019 FINAL  Final  Blood Culture (routine x 2)     Status: None   Collection Time: 06/07/19  7:37 PM   Specimen: BLOOD  Result Value Ref Range Status   Specimen Description   Final    BLOOD RIGHT ANTECUBITAL Performed at [facility], [address], [place], [place] [address]    Special Requests   Final    BOTTLES DRAWN AEROBIC AND ANAEROBIC Blood Culture adequate volume Performed at [facility], [address], [place], [place] [address]    Culture   Final    NO GROWTH 5 DAYS Performed at [facility], [address] [place], [place] [address]    Report Status 06/12/2019 FINAL  Final  SARS Coronavirus 2 [facility] order, Performed in [facility] hospital lab) Nasopharyngeal Nasopharyngeal Swab     Status: None   Collection Time: 06/10/19 10:26 AM   Specimen: Nasopharyngeal Swab  Result Value Ref Range Status   SARS Coronavirus 2 NEGATIVE NEGATIVE Final    Comment: (NOTE) If result is NEGATIVE SARS-CoV-2 target nucleic acids are NOT DETECTED. The SARS-CoV-2 RNA is generally detectable in upper and lower  respiratory specimens during the acute phase of infection. The lowest  concentration of SARS-CoV-2 viral copies this assay can detect is 250  copies / mL. A negative result does not preclude SARS-CoV-2 infection  and should not be used as the sole basis for treatment or other  patient management decisions.  A negative result may occur with  improper specimen collection / handling, submission of  specimen other  than nasopharyngeal swab, presence of viral mutation(s) within the  areas targeted by this assay, and inadequate number of viral copies  (<250 copies / mL). A negative result must be combined with clinical  observations, patient history, and epidemiological information. If result is POSITIVE SARS-CoV-2 target nucleic acids are DETECTED. The SARS-CoV-2 RNA is generally detectable in upper and lower  respiratory specimens dur ing the acute phase of infection.  Positive  results are indicative of active infection with SARS-CoV-2.  Clinical  correlation with patient history and other diagnostic information is  necessary to determine patient infection status.  Positive results do  not rule out bacterial infection or co-infection with other viruses. If result is PRESUMPTIVE POSTIVE SARS-CoV-2 nucleic acids MAY BE PRESENT.   A presumptive positive result was obtained on the submitted specimen  and confirmed on repeat testing.  While 2019 novel coronavirus  (SARS-CoV-2) nucleic acids may be present in the submitted sample  additional confirmatory testing may be necessary for epidemiological  and / or clinical management purposes  to differentiate between  SARS-CoV-2 and other Sarbecovirus currently known to infect humans.  If clinically indicated additional testing with an alternate test  methodology [phone]) is advised. The SARS-CoV-2 RNA is generally  detectable in upper and lower respiratory sp ecimens during the acute  phase of infection. The expected result is Negative. Fact Sheet for Patients:  BoilerBrush.com.cy Fact Sheet for Healthcare Providers: https://pope.com/ This test is not yet approved or cleared by the [place] FDA and has been authorized for detection and/or diagnosis of SARS-CoV-2 by FDA under an Emergency Use Authorization (EUA).  This EUA will remain in effect (meaning this test can be used) for the  duration of the COVID-19 declaration under Section 564(b)(1) of the Act, 21 U.S.C. section 360bbb-3(b)(1), unless the authorization is terminated or revoked sooner. Performed at [facility], [address], [place], [place] [address]   Culture, sputum-assessment     Status: None   Collection Time: 06/10/19  5:30 PM   Specimen: Sputum  Result Value Ref Range Status   Specimen Description SPU  Final   Special Requests NONE  Final   Sputum evaluation   Final    Sputum specimen  not acceptable for testing.  Please recollect.   Performed at [facility], [address] [place], [place] [address]    Report Status 06/10/2019 FINAL  Final  Culture, blood (routine x 2) Call MD if unable to obtain prior to antibiotics being given     Status: None   Collection Time: 06/10/19  6:10 PM   Specimen: BLOOD RIGHT HAND  Result Value Ref Range Status   Specimen Description   Final    BLOOD RIGHT HAND Performed at [facility], [address], [place], [place] [address]    Special Requests   Final    BOTTLES DRAWN AEROBIC ONLY Blood Culture results may not be optimal due to an inadequate volume of blood received in culture bottles Performed at [facility], [address], [place], [place] [address]    Culture   Final    NO GROWTH 5 DAYS Performed at [facility], [address], [place], [place] [address]    Report Status 06/15/2019 FINAL  Final  Culture, blood (routine x 2) Call MD if unable to obtain prior to antibiotics being given     Status: None   Collection Time: 06/10/19  6:15 PM   Specimen: BLOOD  Result Value Ref Range Status   Specimen Description   Final    BLOOD LEFT ANTECUBITAL Performed at [facility], [address], [place], [place] [address]    Special Requests   Final    BOTTLES DRAWN AEROBIC ONLY Blood Culture results may not be optimal due to an inadequate volume of  blood received in culture bottles Performed at [facility], [address], [place], [place] [address]    Culture   Final    NO GROWTH 5 DAYS Performed at [facility], [address] [place], [place] [address]    Report Status 06/15/2019 FINAL  Final  MRSA PCR Screening     Status: None   Collection Time: 06/10/19 11:33 PM   Specimen: Nasopharyngeal  Result Value Ref Range Status   MRSA by PCR NEGATIVE NEGATIVE Final    Comment:        The GeneXpert MRSA Assay (FDA approved for NASAL specimens only), is one component of a comprehensive MRSA colonization surveillance program. It is not intended to diagnose MRSA infection nor to guide or monitor treatment for MRSA infections. Performed at [facility], [address] [place], [place] [address]      Labs: BNP (last 3 results) Recent Labs    06/08/19 1350  BNP 87.4   Basic Metabolic Panel: Recent Labs  Lab 06/09/19 0409 06/10/19 1000 06/11/19 0211 06/15/19 0538  NA 139 138 138 137  K 4.3 3.3* 3.4* 4.1  CL 105 106 106 105  CO2 25 21* 24 24  GLUCOSE 151* 110* 108* 189*  BUN CREATININE 0.84 0.90 1.04 0.74  CALCIUM 8.6* 7.8* 8.7* 8.9  MG  --   --   --  2.1   Liver Function Tests: No results for input(s): AST, ALT, ALKPHOS, BILITOT, PROT, ALBUMIN in the last 168 hours. No results for input(s): LIPASE, AMYLASE in the last 168 hours. No results for input(s): AMMONIA in the last 168 hours. CBC: Recent Labs  Lab 06/09/19 0409 06/10/19 1000 06/11/19 0211 06/15/19 0538  WBC 15.3* 14.4* 11.1* 9.5  NEUTROABS  --  9.4*  --   --   HGB 14.4  13.2 13.1 12.9*  HCT 47.8 43.5 43.1 42.1  MCV 85.2 84.0 85.0 83.5  PLT 438* 429* 391 375   Cardiac Enzymes: No results for input(s): CKTOTAL, CKMB, CKMBINDEX, TROPONINI in the last 168 hours. BNP: Invalid input(s): POCBNP CBG: Recent Labs  Lab 06/08/19 1656 06/08/19 2137 06/09/19 0742 06/09/19 1111  GLUCAP 257*  334* 84 164*   D-Dimer No results for input(s): DDIMER in the last 72 hours. Hgb A1c No results for input(s): HGBA1C in the last 72 hours. Lipid Profile No results for input(s): CHOL, HDL, LDLCALC, TRIG, CHOLHDL, LDLDIRECT in the last 72 hours. Thyroid function studies No results for input(s): TSH, T4TOTAL, T3FREE, THYROIDAB in the last 72 hours.  Invalid input(s): FREET3 Anemia work up No results for input(s): VITAMINB12, FOLATE, FERRITIN, TIBC, IRON, RETICCTPCT in the last 72 hours. Urinalysis    Component Value Date/Time   COLORURINE YELLOW 06/07/2019 1754   APPEARANCEUR CLEAR 06/07/2019 1754   LABSPEC 1.021 06/07/2019 1754   PHURINE 6.0 06/07/2019 1754   GLUCOSEU NEGATIVE 06/07/2019 1754   HGBUR NEGATIVE 06/07/2019 1754   BILIRUBINUR NEGATIVE 06/07/2019 1754   KETONESUR NEGATIVE 06/07/2019 1754   PROTEINUR 100 (A) 06/07/2019 1754   UROBILINOGEN 0.2 06/09/2013 0731   NITRITE NEGATIVE 06/07/2019 1754   LEUKOCYTESUR TRACE (A) 06/07/2019 1754   Sepsis Labs Invalid input(s): PROCALCITONIN,  WBC,  LACTICIDVEN   Time coordinating discharge: 35 minutes  SIGNED:  Mercy Riding, MD  [facility] 06/15/2019, 1:20 PM  If 7PM-7AM, please contact night-coverage [email or web address] Password [email or web address]

## 2019-06-15 NOTE — TOC Transition Note (Addendum)
Transition of Care [facility]) - CM/SW Discharge Note   Patient Details  Name: Benjamin Rosario MRN: [id number] Date of Birth: [birth year]  [facility] CM/SW Contact:  Bianca Vester, Marjie Skiff, RN Phone Number:[phone] 06/15/2019, 4:06 PM   Clinical Narrative:    Pt unable to get CPAP with his insurance through home DME company without sleep study first.  This CM tried 3 different companies. Pt also attempted to schedule home sleep study for today with [facility] and was unsuccessful.  [facility] is willing to let pt private pay for CPAP until sleep study and will have one available at the [address] store tomorrow. Pt expressed that he did not want to stay until tomorrow. This CM let MD know via chat.   Addendum: Pt was given information about his appointment time at [facility] ([address]) on 06/16/2019  for CPAP pick up and mask fitting with respiratory therapist. Pt was informed that he needed to have his mom present to pay for CPAP and would need to be there promptly at 1pm. Pt states that he understands and will be at appointment

## 2019-06-18 ENCOUNTER — Telehealth: Payer: Self-pay | Admitting: Pulmonary Disease

## 2019-06-18 NOTE — Telephone Encounter (Signed)
Spoke to pt.  He just wanted to let me know he is going to get a new pcp with his insurance and then get that pcp to put in a referral.  He has [place] and when [place] tried to get precert for hst she found out his insurance is hmo and requires a referral for him to be seen.  His insurance has his pcp listed in [place].  Told pt to let me know when he gets new pcp and referral in place so we can do his hst.  Nothing further needed at this time.

## 2019-06-18 NOTE — Telephone Encounter (Signed)
Call returned to patient, he states he was speaking with Judeen Hammans earlier regarding his sleep study equipment. I made him aware I would get the message to [facility]. Voiced understanding.

## 2019-07-06 ENCOUNTER — Inpatient Hospital Stay: Admission: RE | Admit: 2019-07-06 | Payer: Self-pay | Source: Ambulatory Visit

## 2019-07-16 ENCOUNTER — Inpatient Hospital Stay (HOSPITAL_COMMUNITY): Admission: RE | Admit: 2019-07-16 | Payer: BLUE CROSS/BLUE SHIELD | Source: Ambulatory Visit

## 2019-07-17 NOTE — Progress Notes (Deleted)
Attempted to call patient for missed covid appointment, unable to leave a voicemail

## 2019-07-20 ENCOUNTER — Ambulatory Visit: Payer: Self-pay | Admitting: Pulmonary Disease

## 2019-09-19 ENCOUNTER — Other Ambulatory Visit: Payer: Self-pay

## 2019-09-19 ENCOUNTER — Emergency Department (HOSPITAL_COMMUNITY): Payer: No Typology Code available for payment source

## 2019-09-19 ENCOUNTER — Inpatient Hospital Stay (HOSPITAL_COMMUNITY)
Admission: EM | Admit: 2019-09-19 | Discharge: 2019-09-20 | DRG: 177 | Disposition: A | Discharge status: Home / Self Care | Payer: No Typology Code available for payment source | Attending: Student | Admitting: Student

## 2019-09-19 ENCOUNTER — Encounter (HOSPITAL_COMMUNITY): Payer: Self-pay

## 2019-09-19 DIAGNOSIS — Z91128 Patient's intentional underdosing of medication regimen for other reason: Secondary | ICD-10-CM

## 2019-09-19 DIAGNOSIS — G4733 Obstructive sleep apnea (adult) (pediatric): Secondary | ICD-10-CM | POA: Diagnosis present

## 2019-09-19 DIAGNOSIS — J439 Emphysema, unspecified: Secondary | ICD-10-CM | POA: Diagnosis present

## 2019-09-19 DIAGNOSIS — I10 Essential (primary) hypertension: Secondary | ICD-10-CM | POA: Diagnosis not present

## 2019-09-19 DIAGNOSIS — J9601 Acute respiratory failure with hypoxia: Secondary | ICD-10-CM | POA: Diagnosis present

## 2019-09-19 DIAGNOSIS — J4541 Moderate persistent asthma with (acute) exacerbation: Secondary | ICD-10-CM | POA: Diagnosis not present

## 2019-09-19 DIAGNOSIS — U071 COVID-19: Principal | ICD-10-CM | POA: Diagnosis present

## 2019-09-19 DIAGNOSIS — Z79899 Other long term (current) drug therapy: Secondary | ICD-10-CM | POA: Diagnosis not present

## 2019-09-19 DIAGNOSIS — F17200 Nicotine dependence, unspecified, uncomplicated: Secondary | ICD-10-CM | POA: Diagnosis present

## 2019-09-19 DIAGNOSIS — J4521 Mild intermittent asthma with (acute) exacerbation: Secondary | ICD-10-CM | POA: Diagnosis present

## 2019-09-19 DIAGNOSIS — R0602 Shortness of breath: Secondary | ICD-10-CM

## 2019-09-19 DIAGNOSIS — T486X6A Underdosing of antiasthmatics, initial encounter: Secondary | ICD-10-CM | POA: Diagnosis present

## 2019-09-19 DIAGNOSIS — Z6841 Body Mass Index (BMI) 40.0 and over, adult: Secondary | ICD-10-CM

## 2019-09-19 DIAGNOSIS — Z7951 Long term (current) use of inhaled steroids: Secondary | ICD-10-CM | POA: Diagnosis not present

## 2019-09-19 DIAGNOSIS — Z833 Family history of diabetes mellitus: Secondary | ICD-10-CM | POA: Diagnosis not present

## 2019-09-19 DIAGNOSIS — F321 Major depressive disorder, single episode, moderate: Secondary | ICD-10-CM | POA: Diagnosis present

## 2019-09-19 DIAGNOSIS — E039 Hypothyroidism, unspecified: Secondary | ICD-10-CM | POA: Diagnosis not present

## 2019-09-19 DIAGNOSIS — F902 Attention-deficit hyperactivity disorder, combined type: Secondary | ICD-10-CM | POA: Diagnosis present

## 2019-09-19 DIAGNOSIS — T381X6A Underdosing of thyroid hormones and substitutes, initial encounter: Secondary | ICD-10-CM | POA: Diagnosis present

## 2019-09-19 DIAGNOSIS — E782 Mixed hyperlipidemia: Secondary | ICD-10-CM | POA: Diagnosis present

## 2019-09-19 DIAGNOSIS — Z8249 Family history of ischemic heart disease and other diseases of the circulatory system: Secondary | ICD-10-CM

## 2019-09-19 DIAGNOSIS — Z79890 Hormone replacement therapy (postmenopausal): Secondary | ICD-10-CM | POA: Diagnosis not present

## 2019-09-19 DIAGNOSIS — R9389 Abnormal findings on diagnostic imaging of other specified body structures: Secondary | ICD-10-CM | POA: Diagnosis not present

## 2019-09-19 DIAGNOSIS — J1282 Pneumonia due to coronavirus disease 2019: Secondary | ICD-10-CM | POA: Diagnosis present

## 2019-09-19 DIAGNOSIS — R0902 Hypoxemia: Secondary | ICD-10-CM

## 2019-09-19 HISTORY — DX: Unspecified asthma, uncomplicated: J45.909

## 2019-09-19 LAB — CBC WITH DIFFERENTIAL/PLATELET
Abs Immature Granulocytes: 0.02 10*3/uL (ref 0.00–0.07)
Basophils Absolute: 0 10*3/uL (ref 0.0–0.1)
Basophils Relative: 0 %
Eosinophils Absolute: 0.1 10*3/uL (ref 0.0–0.5)
Eosinophils Relative: 1 %
HCT: 49.1 % (ref 39.0–52.0)
Hemoglobin: 14.7 g/dL (ref 13.0–17.0)
Immature Granulocytes: 0 %
Lymphocytes Relative: 22 %
Lymphs Abs: 2 10*3/uL (ref 0.7–4.0)
MCH: 25.2 pg — ABNORMAL LOW (ref 26.0–34.0)
MCHC: 29.9 g/dL — ABNORMAL LOW (ref 30.0–36.0)
MCV: 84.2 fL (ref 80.0–100.0)
Monocytes Absolute: 0.6 10*3/uL (ref 0.1–1.0)
Monocytes Relative: 6 %
Neutro Abs: 6.6 10*3/uL (ref 1.7–7.7)
Neutrophils Relative %: 71 %
Platelets: 357 10*3/uL (ref 150–400)
RBC: 5.83 MIL/uL — ABNORMAL HIGH (ref 4.22–5.81)
RDW: 16.4 % — ABNORMAL HIGH (ref 11.5–15.5)
WBC: 9.4 10*3/uL (ref 4.0–10.5)
nRBC: 0 % (ref 0.0–0.2)

## 2019-09-19 LAB — D-DIMER, QUANTITATIVE: D-Dimer, Quant: 0.69 ug/mL-FEU — ABNORMAL HIGH (ref 0.00–0.50)

## 2019-09-19 LAB — CREATININE, SERUM
Creatinine, Ser: 1.03 mg/dL (ref 0.61–1.24)
GFR calc Af Amer: >60 mL/min (ref >60)
GFR calc non Af Amer: >60 mL/min (ref >60)

## 2019-09-19 LAB — BASIC METABOLIC PANEL
Anion gap: 11 (ref 5–15)
BUN: 10 mg/dL (ref 6–20)
CO2: 27 mmol/L (ref 22–32)
Calcium: 8.7 mg/dL — ABNORMAL LOW (ref 8.9–10.3)
Chloride: 103 mmol/L (ref 98–111)
Creatinine, Ser: 1.03 mg/dL (ref 0.61–1.24)
GFR calc Af Amer: >60 mL/min (ref >60)
GFR calc non Af Amer: >60 mL/min (ref >60)
Glucose, Bld: 97 mg/dL (ref 70–99)
Potassium: 4.3 mmol/L (ref 3.5–5.1)
Sodium: 141 mmol/L (ref 135–145)

## 2019-09-19 LAB — CBC
HCT: 50.4 % (ref 39.0–52.0)
Hemoglobin: 15.1 g/dL (ref 13.0–17.0)
MCH: 24.9 pg — ABNORMAL LOW (ref 26.0–34.0)
MCHC: 30 g/dL (ref 30.0–36.0)
MCV: 83.2 fL (ref 80.0–100.0)
Platelets: 376 10*3/uL (ref 150–400)
RBC: 6.06 MIL/uL — ABNORMAL HIGH (ref 4.22–5.81)
RDW: 16.9 % — ABNORMAL HIGH (ref 11.5–15.5)
WBC: 8.7 10*3/uL (ref 4.0–10.5)
nRBC: 0 % (ref 0.0–0.2)

## 2019-09-19 LAB — PROCALCITONIN: Procalcitonin: <0.1 ng/mL

## 2019-09-19 LAB — RESPIRATORY PANEL BY RT PCR (FLU A&B, COVID)
Influenza A by PCR: NEGATIVE
Influenza B by PCR: NEGATIVE
SARS Coronavirus 2 by RT PCR: POSITIVE — AB

## 2019-09-19 LAB — LACTIC ACID, PLASMA: Lactic Acid, Venous: 2 mmol/L (ref 0.5–1.9)

## 2019-09-19 LAB — LACTATE DEHYDROGENASE: LDH: 260 U/L — ABNORMAL HIGH (ref 98–192)

## 2019-09-19 LAB — FIBRINOGEN: Fibrinogen: 526 mg/dL — ABNORMAL HIGH (ref 210–475)

## 2019-09-19 LAB — FERRITIN: Ferritin: 72 ng/mL (ref 24–336)

## 2019-09-19 LAB — ABO/RH: ABO/RH(D): A POS

## 2019-09-19 LAB — C-REACTIVE PROTEIN: CRP: 1.7 mg/dL — ABNORMAL HIGH (ref <1.0)

## 2019-09-19 LAB — TRIGLYCERIDES: Triglycerides: 112 mg/dL (ref <150)

## 2019-09-19 MED ORDER — ONDANSETRON HCL 4 MG/2ML IJ SOLN
4.0000 mg | Freq: Four times a day (QID) | INTRAMUSCULAR | Status: DC | PRN
  Filled 2019-09-19: qty 2

## 2019-09-19 MED ORDER — SODIUM CHLORIDE 0.9 % IV SOLN: INTRAVENOUS | Status: DC

## 2019-09-19 MED ORDER — DEXAMETHASONE SODIUM PHOSPHATE 10 MG/ML IJ SOLN
6.0000 mg | INTRAMUSCULAR | Status: DC
  Administered 2019-09-19: 6 mg via INTRAVENOUS
  Filled 2019-09-19: qty 1

## 2019-09-19 MED ORDER — SODIUM CHLORIDE (PF) 0.9 % IJ SOLN
INTRAMUSCULAR | Status: AC
  Filled 2019-09-19: qty 50

## 2019-09-19 MED ORDER — LEVOTHYROXINE SODIUM 75 MCG PO TABS
75.0000 ug | ORAL_TABLET | Freq: Every day | ORAL | Status: DC
  Administered 2019-09-20: 75 ug via ORAL
  Filled 2019-09-19: qty 1

## 2019-09-19 MED ORDER — SENNOSIDES-DOCUSATE SODIUM 8.6-50 MG PO TABS: 1.0000 | ORAL_TABLET | Freq: Every evening | ORAL | Status: DC | PRN

## 2019-09-19 MED ORDER — ENOXAPARIN SODIUM 40 MG/0.4ML ~~LOC~~ SOLN
40.0000 mg | SUBCUTANEOUS | Status: DC
  Administered 2019-09-19: 40 mg via SUBCUTANEOUS
  Filled 2019-09-19: qty 0.4

## 2019-09-19 MED ORDER — ALBUTEROL SULFATE HFA 108 (90 BASE) MCG/ACT IN AERS
2.0000 | INHALATION_SPRAY | Freq: Four times a day (QID) | RESPIRATORY_TRACT | Status: DC
  Administered 2019-09-19 – 2019-09-20 (×3): 2 via RESPIRATORY_TRACT
  Filled 2019-09-19 (×2): qty 6.7

## 2019-09-19 MED ORDER — MOMETASONE FURO-FORMOTEROL FUM 100-5 MCG/ACT IN AERO
2.0000 | INHALATION_SPRAY | Freq: Two times a day (BID) | RESPIRATORY_TRACT | Status: DC
  Administered 2019-09-19 – 2019-09-20 (×2): 2 via RESPIRATORY_TRACT
  Filled 2019-09-19: qty 8.8

## 2019-09-19 MED ORDER — DOCUSATE SODIUM 100 MG PO CAPS
100.0000 mg | ORAL_CAPSULE | Freq: Two times a day (BID) | ORAL | Status: DC
  Administered 2019-09-19 – 2019-09-20 (×2): 100 mg via ORAL
  Filled 2019-09-19 (×2): qty 1

## 2019-09-19 MED ORDER — FOLIC ACID 1 MG PO TABS
1.0000 mg | ORAL_TABLET | Freq: Every day | ORAL | Status: DC
  Administered 2019-09-19 – 2019-09-20 (×2): 1 mg via ORAL
  Filled 2019-09-19 (×2): qty 1

## 2019-09-19 MED ORDER — HYDROCOD POLST-CPM POLST ER 10-8 MG/5ML PO SUER
5.0000 mL | Freq: Two times a day (BID) | ORAL | Status: DC | PRN
  Administered 2019-09-19: 5 mL via ORAL
  Filled 2019-09-19: qty 5

## 2019-09-19 MED ORDER — ACETAMINOPHEN 325 MG PO TABS
650.0000 mg | ORAL_TABLET | Freq: Four times a day (QID) | ORAL | Status: DC | PRN
  Administered 2019-09-19: 650 mg via ORAL
  Filled 2019-09-19: qty 2

## 2019-09-19 MED ORDER — SODIUM CHLORIDE 0.9 % IV SOLN
100.0000 mg | Freq: Every day | INTRAVENOUS | Status: DC
  Administered 2019-09-20: 100 mg via INTRAVENOUS
  Filled 2019-09-19: qty 100

## 2019-09-19 MED ORDER — ONDANSETRON HCL 4 MG PO TABS: 4.0000 mg | ORAL_TABLET | Freq: Four times a day (QID) | ORAL | Status: DC | PRN

## 2019-09-19 MED ORDER — PREDNISONE 20 MG PO TABS
60.0000 mg | ORAL_TABLET | Freq: Every day | ORAL | Status: DC
  Administered 2019-09-19: 60 mg via ORAL
  Filled 2019-09-19: qty 3

## 2019-09-19 MED ORDER — AMLODIPINE BESYLATE 10 MG PO TABS
10.0000 mg | ORAL_TABLET | Freq: Every day | ORAL | Status: DC
  Administered 2019-09-20 (×2): 10 mg via ORAL
  Filled 2019-09-19: qty 1
  Filled 2019-09-19: qty 2

## 2019-09-19 MED ORDER — GUAIFENESIN-DM 100-10 MG/5ML PO SYRP: 10.0000 mL | ORAL_SOLUTION | ORAL | Status: DC | PRN

## 2019-09-19 MED ORDER — IOHEXOL 350 MG/ML SOLN
100.0000 mL | Freq: Once | INTRAVENOUS | Status: AC | PRN
  Administered 2019-09-19: 100 mL via INTRAVENOUS

## 2019-09-19 MED ORDER — SODIUM CHLORIDE 0.9 % IV SOLN
200.0000 mg | Freq: Once | INTRAVENOUS | Status: AC
  Administered 2019-09-19: 200 mg via INTRAVENOUS
  Filled 2019-09-19: qty 200

## 2019-09-19 NOTE — ED Provider Notes (Signed)
Received signout from previous provider, please see his note for complete H&P.  In short this is a 19 year old morbidly obese male with history of asthma presenting for cold symptoms which include cough, congestion, headache ongoing for the past 2 days.  She also endorsed seeing some small streak of blood in his mucus.  Patient initially found to be hypoxic with O2 sats of 70% on room air, improved to 96% on 6 L.  COVID-19 test is currently pending.  A chest CT angiogram performed showed no obvious evidence of PE however there scattered pulmonary opacity consistent with multifocal pneumonia.  In the setting of COVID-19 symptoms and chest CT scan that shows multifocal pneumonia, along with hypoxia, patient warranted hospital admission.  4:32 PM COVID-19 test came back positive, confirming acute respiratory failure 2/2 covid-19 infection.  Appreciate consultation from [facility] Dr. Dwyane Dee who agrees to see and admit pt to step down.  Additional inflammatory markers ordered. Pt have received prednisone, but will likely benefit from Remdesevir and Decadron.  BP (!) 149/89   Pulse (!) 102   Temp 98.5 F (36.9 C) (Oral)   Resp 20   SpO2 99%   CRITICAL CARE Performed by: Domenic Moras Total critical care time: 30 minutes Critical care time was exclusive of separately billable procedures and treating other patients. Critical care was necessary to treat or prevent imminent or life-threatening deterioration. Critical care was time spent personally by me on the following activities: development of treatment plan with patient and/or surrogate as well as nursing, discussions with consultants, evaluation of patient's response to treatment, examination of patient, obtaining history from patient or surrogate, ordering and performing treatments and interventions, ordering and review of laboratory studies, ordering and review of radiographic studies, pulse oximetry and re-evaluation of patient's  condition.  Results for orders placed or performed during the hospital encounter of 09/19/19  Respiratory Panel by RT PCR (Flu A&B, Covid) - Nasopharyngeal Swab   Specimen: Nasopharyngeal Swab  Result Value Ref Range   SARS Coronavirus 2 by RT PCR POSITIVE (A) NEGATIVE   Influenza A by PCR NEGATIVE NEGATIVE   Influenza B by PCR NEGATIVE NEGATIVE  Basic metabolic panel  Result Value Ref Range   Sodium 141 135 - 145 mmol/L   Potassium 4.3 3.5 - 5.1 mmol/L   Chloride 103 98 - 111 mmol/L   CO2 27 22 - 32 mmol/L   Glucose, Bld 97 70 - 99 mg/dL   BUN 10 6 - 20 mg/dL   Creatinine, Ser 1.03 0.61 - 1.24 mg/dL   Calcium 8.7 (L) 8.9 - 10.3 mg/dL   GFR calc non Af Amer >60 >60 mL/min   GFR calc Af Amer >60 >60 mL/min   Anion gap 11 5 - 15  CBC WITH DIFFERENTIAL  Result Value Ref Range   WBC 9.4 4.0 - 10.5 K/uL   RBC 5.83 (H) 4.22 - 5.81 MIL/uL   Hemoglobin 14.7 13.0 - 17.0 g/dL   HCT 49.1 39.0 - 52.0 %   MCV 84.2 80.0 - 100.0 fL   MCH 25.2 (L) 26.0 - 34.0 pg   MCHC 29.9 (L) 30.0 - 36.0 g/dL   RDW 16.4 (H) 11.5 - 15.5 %   Platelets 357 150 - 400 K/uL   nRBC 0.0 0.0 - 0.2 %   Neutrophils Relative % 71 %   Neutro Abs 6.6 1.7 - 7.7 K/uL   Lymphocytes Relative 22 %   Lymphs Abs 2.0 0.7 - 4.0 K/uL   Monocytes Relative 6 %  Monocytes Absolute 0.6 0.1 - 1.0 K/uL   Eosinophils Relative 1 %   Eosinophils Absolute 0.1 0.0 - 0.5 K/uL   Basophils Relative 0 %   Basophils Absolute 0.0 0.0 - 0.1 K/uL   Immature Granulocytes 0 %   Abs Immature Granulocytes 0.02 0.00 - 0.07 K/uL   CT Angio Chest PE W/Cm &/Or Wo Cm  Result Date: 09/19/2019 CLINICAL DATA:  Shortness of breath. EXAM: CT ANGIOGRAPHY CHEST WITH CONTRAST TECHNIQUE: Multidetector CT imaging of the chest was performed using the standard protocol during bolus administration of intravenous contrast. Multiplanar CT image reconstructions and MIPs were obtained to evaluate the vascular anatomy. CONTRAST:  OMNIPAQUE IOHEXOL 350 MG/ML  SOLN COMPARISON:  CT chest dated 06/10/2019. FINDINGS: Cardiovascular: Evaluation for pulmonary emboli is limited by patient body habitus, suboptimal contrast bolus timing and respiratory motion artifact.Given this limitation, no acute pulmonary embolism was detected. Detection of small segmental and subsegmental pulmonary emboli is limited. 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 The main pulmonary artery is within normal limits for size. There is no CT evidence of acute right heart strain. The visualized aorta is normal. Heart size is normal, without pericardial effusion. Mediastinum/Nodes: --No mediastinal or hilar lymphadenopathy. --No axillary lymphadenopathy. --No supraclavicular lymphadenopathy. --Normal thyroid gland. --The esophagus is unremarkable Lungs/Pleura: There is a small focus of consolidation in the left lung apex. Right-sided volume loss with paraseptal emphysematous changes are noted. There is a mosaic appearance throughout both lung fields. There is scattered areas of consolidation involving the right lower lobe and left lower lobe. There is no pneumothorax. No large pleural effusion. The trachea is unremarkable. Upper Abdomen: No acute abnormality. Musculoskeletal: No chest wall abnormality. No acute or significant osseous findings. There is a subpleural nodule along the anterior chest wall on the left that is stable from prior studies and is likely benign. Review of the MIP images confirms the above findings. IMPRESSION: 1. Evaluation for pulmonary emboli is limited as detailed above. Given these limitations, no PE was identified. 2. 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 3. 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 4. Scattered pulmonary opacities are again noted and are consistent with multifocal  pneumonia. Recurrent pulmonary infection can be seen in patients with congenital interruption of a pulmonary artery. Electronically Signed   By: Katherine Mantle M.D.   On: 09/19/2019 15:49   DG Chest Port 1 View  Result Date: 09/19/2019 CLINICAL DATA:  Shortness of breath EXAM: PORTABLE CHEST 1 VIEW COMPARISON:  June 10, 2019 chest radiograph and chest CT FINDINGS: There is scarring in the right base. There is no edema or consolidation. Heart is mildly enlarged with pulmonary vascularity, stable. No adenopathy. No bone lesions. IMPRESSION: Scarring right base. No edema or consolidation. Stable cardiac enlargement. No adenopathy. Electronically Signed   By: Bretta Bang III M.D.   On: 09/19/2019 14:11      Fayrene Helper, PA-C 09/19/19 1634    Sabas Sous, MD 09/20/19 [id number]

## 2019-09-19 NOTE — ED Notes (Signed)
Pt assisted to bedside commode

## 2019-09-19 NOTE — ED Provider Notes (Addendum)
[place] DEPT Provider Note   CSN: [id number] Arrival date & time: 09/19/19  1237     History Chief Complaint  Patient presents with  . Shortness of Breath  . Dizziness  . Headache    Benjamin Rosario is a 19 y.o. male.  HPI  Patient is 19 year old male presented today with cough, congestion, headache.  States that his symptoms began with cough congestion 2 days ago and has progressively worsened.  States he has been coughing up some mucus occasionally with small streaks of blood.   Patient states that he is a heavy smoker but is trying to taper down on smoking.  Has known structural lung disease.  Is also severely obese.  Patient has been discharged with oxygen in the past but states he never uses it.  Patient endorses chest pain with coughing denies any pleuritic chest pain.  Denies any exertional chest pain.  States that his chest feels tight.  States that he felt somewhat short of breath.  Denies any loss of taste or smell or known Covid exposure however he works at [employer] and states that he is around coworkers and shoppers every day.   On my review of EMR patient has been hospitalized numerous times in the past for hypoxia.  Typically he is found to have some form of pneumonia.    Past Medical History:  Diagnosis Date  . ADHD (attention deficit hyperactivity disorder)   . Asthma   . Chlamydia 11/25/2018  . Emphysema of lung ([place]) 11/25/2018  . Environmental allergies   . Hypothyroidism 11/25/2018    Patient Active Problem List   Diagnosis Date Noted  . Chronic respiratory failure with hypoxia ([place])   . Acute respiratory failure with hypoxia ([place]) 06/10/2019  . Hypokalemia 06/10/2019  . Morbid obesity with BMI of 50.0-59.9, adult ([place]) 06/10/2019  . Acute respiratory failure with hypoxemia ([place]) 06/07/2019  . Community acquired pneumonia 06/07/2019  . Hypothyroidism 11/25/2018  . Chlamydia 11/25/2018  . SOB (shortness of breath)  11/25/2018  . Chest pain 11/25/2018  . Emphysema lung ([place]) 11/25/2018  . Concussion with no loss of consciousness 07/27/2015  . ADHD (attention deficit hyperactivity disorder), combined type 06/07/2013  . MDD (major depressive disorder), single episode, moderate ([place]) 06/06/2013  . Mixed hyperlipidemia 02/26/2011  . Obesity 02/26/2011  . Pre-diabetes 02/26/2011  . Other specified acquired hypothyroidism 02/26/2011    No past surgical history on file.     Family History  Problem Relation Age of Onset  . Hypertension Father   . Diabetes Father     Social History   Tobacco Use  . Smoking status: Never Smoker  . Smokeless tobacco: Never Used  Substance Use Topics  . Alcohol use: No    Alcohol/week: 0.0 standard drinks  . Drug use: Yes    Types: Marijuana    Home Medications Prior to Admission medications   Medication Sig Start Date End Date Taking? Authorizing Provider  albuterol (PROVENTIL HFA;VENTOLIN HFA) 108 (90 Base) MCG/ACT inhaler Inhale 2 puffs into the lungs every 6 (six) hours as needed for wheezing or shortness of breath. 11/28/18   Purohit, Konrad Dolores, MD  amLODipine (NORVASC) 10 MG tablet Take 1 tablet (10 mg total) by mouth daily. 06/15/19   Mercy Riding, MD  hydrochlorothiazide (MICROZIDE) 12.5 MG capsule Take 1 capsule (12.5 mg total) by mouth daily. 06/15/19   Mercy Riding, MD  levothyroxine (SYNTHROID) 75 MCG tablet Take 1 tablet (75 mcg total) by mouth daily  before breakfast. 06/09/19   Rai, Ripudeep K, MD  mometasone-formoterol (DULERA) 100-5 MCG/ACT AERO Inhale 2 puffs into the lungs 2 (two) times daily. 06/09/19   Cathren Harsh, MD    Allergies    Patient has no known allergies.  Review of Systems   Review of Systems  Constitutional: Positive for fatigue. Negative for chills and fever.  HENT: Negative for congestion.   Eyes: Negative for pain.  Respiratory: Negative for cough POSITIVE FOR SOB.   Cardiovascular: Negative for chest pain and leg  swelling.  Gastrointestinal: Negative for abdominal pain and vomiting.  Genitourinary: Negative for dysuria.  Musculoskeletal: Negative for myalgias.  Skin: Negative for rash.  Neurological: Negative for dizziness and headaches.    Physical Exam Updated Vital Signs BP (!) 149/89   Pulse (!) 102   Temp 98.5 F (36.9 C) (Oral)   Resp 20   SpO2 99%   CONSTITUTIONAL:  well-appearing, NAD NEURO:  Alert and oriented x 3, no focal deficits EYES:  pupils equal and reactive ENT/NECK:  trachea midline, no JVD CARDIO:  reg rate, reg rhythm, well-perfused PULM:  None labored breathing, on 4 L nasal cannula satting 98%.  When nasal cannula removed patient desaturates to 80% at rest.  No wheezing.  Lungs are clear to auscultation bilaterally. GI/GU:  Abdomen non-distended MSK/SPINE:  No gross deformities, no edema SKIN:  no rash obvious, atraumatic, no ecchymosis  PSYCH:  Appropriate speech and behavior    ED Results / Procedures / Treatments   Labs (all labs ordered are listed, but only abnormal results are displayed) Labs Reviewed  BASIC METABOLIC PANEL - Abnormal; Notable for the following components:      Result Value   Calcium 8.7 (*)    All other components within normal limits  CBC WITH DIFFERENTIAL/PLATELET - Abnormal; Notable for the following components:   RBC 5.83 (*)    MCH 25.2 (*)    MCHC 29.9 (*)    RDW 16.4 (*)    All other components within normal limits  RESPIRATORY PANEL BY RT PCR (FLU A&B, COVID)    EKG EKG Interpretation  Date/Time:  Saturday September 19 2019 14:44:17 EST Ventricular Rate:  92 PR Interval:    QRS Duration: 95 QT Interval:  348 QTC Calculation: 431 R Axis:   99 Text Interpretation: Sinus rhythm Borderline right axis deviation Borderline T wave abnormalities No significant change since last tracing Confirmed by Lorre Nick ([id number]) on 09/19/2019 3:17:43 PM   Radiology DG Chest Port 1 View  Result Date: 09/19/2019 CLINICAL DATA:   Shortness of breath EXAM: PORTABLE CHEST 1 VIEW COMPARISON:  June 10, 2019 chest radiograph and chest CT FINDINGS: There is scarring in the right base. There is no edema or consolidation. Heart is mildly enlarged with pulmonary vascularity, stable. No adenopathy. No bone lesions. IMPRESSION: Scarring right base. No edema or consolidation. Stable cardiac enlargement. No adenopathy. Electronically Signed   By: Bretta Bang III M.D.   On: 09/19/2019 14:11    Procedures Procedures (including critical care time) CRITICAL CARE Performed by: Gailen Shelter   Total critical care time: 35 minutes  Critical care time was exclusive of separately billable procedures and treating other patients.  Critical care was necessary to treat or prevent imminent or life-threatening deterioration.  Critical care was time spent personally by me on the following activities: development of treatment plan with patient and/or surrogate as well as nursing, discussions with consultants, evaluation of patient's response to treatment, examination of  patient, obtaining history from patient or surrogate, ordering and performing treatments and interventions, ordering and review of laboratory studies, ordering and review of radiographic studies, pulse oximetry and re-evaluation of patient's condition.  Medications Ordered in ED Medications  predniSONE (DELTASONE) tablet 60 mg (60 mg Oral Given 09/19/19 1426)  sodium chloride (PF) 0.9 % injection (has no administration in time range)  iohexol (OMNIPAQUE) 350 MG/ML injection 100 mL (100 mLs Intravenous Contrast Given 09/19/19 1520)    ED Course  I have reviewed the triage vital signs and the nursing notes.  Pertinent labs & imaging results that were available during my care of the patient were reviewed by me and considered in my medical decision making (see chart for details).  Clinical Course as of Sep 18 1530  Sat Sep 19, 2019  1509 Attempted to wean patient off  oxygen --oxygen saturations dropped to 81%.  SpO2(!): 81 % [WF]    Clinical Course User Index [WF] Gailen Shelter, [place]   MDM Rules/Calculators/A&P                      Jhase Creppel was evaluated in Emergency Department on 09/19/2019 for the symptoms described in the history of present illness. He was evaluated in the context of the global COVID-19 pandemic, which necessitated consideration that the patient might be at risk for infection with the SARS-CoV-2 virus that causes COVID-19. Institutional protocols and algorithms that pertain to the evaluation of patients at risk for COVID-19 are in a state of rapid change based on information released by regulatory bodies including the CDC and federal and state organizations. These policies and algorithms were followed during the patient's care in the ED.'   Presents with 2 days of cough cold congestion found to be hypoxic in ED.  Suspect Covid infection on my review of EMR patient appears to have frequent hospitalizations for hypoxia but has not been deemed a candidate for home oxygen.  CBC without leukocytosis or anemia.  BMP without electrolyte abnormalities.  Covid test pending with flu.  EKG with no evidence of ischemia however there is a right axis deviation and some lateral T wave inversions.  Will obtain CT scan to rule out pulmonary embolism.  Patient has had scans in the past that were negative.  Chest x-ray reviewed and resolved with no evidence of infiltrate.  Will have better resolution on CT scan.  No wheezing to indicate asthma exacerbation or COPD related sx. Patient is without CP to indicate pericarditis/CHF or ACS. Likely this hypoxia is multifactorial due to COVID as well as obesity hyperventilation syndrome and his known structural lung disease. May be some aspect of infection/pneumonia.    3:30 PM -- Care transferred to Fayrene Helper PA-C who will interpret results of CT scan and COVID test. Anticipate admission for hypoxia. Patient  has been admitted for similar episodes in the past.     Final Clinical Impression(s) / ED Diagnoses Final diagnoses:  Shortness of breath  Hypoxia    Rx / DC Orders ED Discharge Orders    None       Gailen Shelter, [place] 09/19/19 1533    Gailen Shelter, PA 09/19/19 1622    Solon Augusta [place], [place] 09/20/19 0739    Lorre Nick, MD 09/20/19 1439

## 2019-09-19 NOTE — ED Triage Notes (Signed)
Pt c/o SOB that is worse today. Pt has hx of asthma and last used albuterol inhaler this morning with no relief. Pt c/o dizziness and headache for last few days. Pt 78% RA upon arrival to room, pt now 99% 6L [place].

## 2019-09-19 NOTE — H&P (Addendum)
History and Physical    Benjamin Rosario LEX:[id number] DOB: September 05, 2001 DOA: 09/19/2019  PCP: Tracey Harries, MD   Patient coming from:  Home  I have personally briefly reviewed patient's old medical records in [facility]  Chief Complaint: Cough, congestion and worsening shortness of breath for few days.  HPI: Benjamin Rosario is a 19 y.o. male with medical history significant of ADHD, major depression, morbid obesity, obstructive sleep apnea on CPAP, hypothyroidism presents in the emergency department with cough,  Congestion, headache and worsening shortness of breath.  He reports having cough for more than a week which has been getting worse, while coughing he has noted some streaks of blood in the morning.  Patient is heavy smoker,  states he is trying to quit smoking.  Patient also endorses having substernal chest pain associated with coughing,  denies any exertional chest pain. He denies any loss of taste or smell sense , denies any recent exposure to Covid but he works at [employer].  He said he is around coworkers and people every day.  He is not on oxygen at baseline but reports he was discharged home once on oxygen but he has not been using it.  ED Course:  Vitals: BP 149/ 89, HR102, temp 98.5, RR 20, Spo2 70% on arrival at room air which improved to 94 on 4 L. Labs: CBC: WBC 9.4, hemoglobin 14.7, hematocrit 49.1, platelet 357, sodium 141, potassium 4.3, chloride 103, bicarb 27, BUN 10, creatinine 1.03, calcium 8.7, glucose 97. Influenza negative, Covid positive.  CRP, fibrinogen, D-dimer pending CT chest: No PE was noted, Scattered pulmonary opacities are again noted and are consistent with multifocal pneumonia  Review of Systems: As per HPI otherwise 10 point review of systems negative.   Review of Systems  Constitutional: Positive for chills.  HENT: Positive for congestion.   Eyes: Negative.   Respiratory: Positive for cough, hemoptysis and shortness of breath.   Cardiovascular:  Positive for chest pain.  Gastrointestinal: Negative.   Genitourinary: Negative.   Musculoskeletal: Negative.   Skin: Negative.   Neurological: Positive for headaches.  Psychiatric/Behavioral: Negative.     Past Medical History:  Diagnosis Date  . ADHD (attention deficit hyperactivity disorder)   . Asthma   . Chlamydia 11/25/2018  . Emphysema of lung ([facility]) 11/25/2018  . Environmental allergies   . Hypothyroidism 11/25/2018    No past surgical history on file.   No Known Allergies  Family History  Problem Relation Age of Onset  . Hypertension Father   . Diabetes Father     Prior to Admission medications   Medication Sig Start Date End Date Taking? Authorizing Provider  albuterol (PROVENTIL HFA;VENTOLIN HFA) 108 (90 Base) MCG/ACT inhaler Inhale 2 puffs into the lungs every 6 (six) hours as needed for wheezing or shortness of breath. 11/28/18   Purohit, Salli Quarry, MD  amLODipine (NORVASC) 10 MG tablet Take 1 tablet (10 mg total) by mouth daily. 06/15/19   Almon Hercules, MD  hydrochlorothiazide (MICROZIDE) 12.5 MG capsule Take 1 capsule (12.5 mg total) by mouth daily. 06/15/19   Almon Hercules, MD  levothyroxine (SYNTHROID) 75 MCG tablet Take 1 tablet (75 mcg total) by mouth daily before breakfast. 06/09/19   Rai, Ripudeep K, MD  mometasone-formoterol (DULERA) 100-5 MCG/ACT AERO Inhale 2 puffs into the lungs 2 (two) times daily. 06/09/19   Cathren Harsh, MD    Physical Exam: Vitals:   09/19/19 1250 09/19/19 1252 09/19/19 1427 09/19/19 1427  BP:  Marland Kitchen)  149/89    Pulse:  (!) 102    Resp:  20    Temp:  98.5 F (36.9 C)    TempSrc:  Oral    SpO2: 99% 99% (!) 81% 99%    Constitutional: NAD, calm, comfortable Vitals:   09/19/19 1250 09/19/19 1252 09/19/19 1427 09/19/19 1427  BP:  (!) 149/89    Pulse:  (!) 102    Resp:  20    Temp:  98.5 F (36.9 C)    TempSrc:  Oral    SpO2: 99% 99% (!) 81% 99%   Eyes: PERRL, lids and conjunctivae normal ENMT: Mucous membranes are moist.  Posterior pharynx clear of any exudate or lesions.Normal dentition.  Neck: normal, supple, no masses, no thyromegaly Respiratory: clear to auscultation bilaterally, Mild wheezing, no crackles. Normal respiratory effort. No accessory muscle use.  Cardiovascular: Regular rate and rhythm, no murmurs / rubs / gallops. No extremity edema. 2+ pedal pulses. No carotid bruits.  Abdomen: no tenderness, no masses palpated. No hepatosplenomegaly. Bowel sounds positive.  Musculoskeletal: no clubbing / cyanosis. No joint deformity upper and lower extremities. Good ROM, no contractures. Normal muscle tone.  Skin: no rashes, lesions, ulcers. No induration Neurologic: CN 2-12 grossly intact. Sensation intact, DTR normal. Strength 5/5 in all 4.  Psychiatric: Normal judgment and insight. Alert and oriented x 3. Normal mood.    Labs on Admission: I have personally reviewed following labs and imaging studies  CBC: Recent Labs  Lab 09/19/19 1429  WBC 9.4  NEUTROABS 6.6  HGB 14.7  HCT 49.1  MCV 84.2  PLT 357   Basic Metabolic Panel: Recent Labs  Lab 09/19/19 1429  NA 141  K 4.3  CL 103  CO2 27  GLUCOSE 97  BUN 10  CREATININE 1.03  CALCIUM 8.7*   GFR: CrCl cannot be calculated (Unknown ideal weight.). Liver Function Tests: No results for input(s): AST, ALT, ALKPHOS, BILITOT, PROT, ALBUMIN in the last 168 hours. No results for input(s): LIPASE, AMYLASE in the last 168 hours. No results for input(s): AMMONIA in the last 168 hours. Coagulation Profile: No results for input(s): INR, PROTIME in the last 168 hours. Cardiac Enzymes: No results for input(s): CKTOTAL, CKMB, CKMBINDEX, TROPONINI in the last 168 hours. BNP (last 3 results) No results for input(s): PROBNP in the last 8760 hours. HbA1C: No results for input(s): HGBA1C in the last 72 hours. CBG: No results for input(s): GLUCAP in the last 168 hours. Lipid Profile: No results for input(s): CHOL, HDL, LDLCALC, TRIG, CHOLHDL, LDLDIRECT  in the last 72 hours. Thyroid Function Tests: No results for input(s): TSH, T4TOTAL, FREET4, T3FREE, THYROIDAB in the last 72 hours. Anemia Panel: No results for input(s): VITAMINB12, FOLATE, FERRITIN, TIBC, IRON, RETICCTPCT in the last 72 hours. Urine analysis:    Component Value Date/Time   COLORURINE YELLOW 06/07/2019 1754   APPEARANCEUR CLEAR 06/07/2019 1754   LABSPEC 1.021 06/07/2019 1754   PHURINE 6.0 06/07/2019 1754   GLUCOSEU NEGATIVE 06/07/2019 1754   HGBUR NEGATIVE 06/07/2019 1754   BILIRUBINUR NEGATIVE 06/07/2019 1754   KETONESUR NEGATIVE 06/07/2019 1754   PROTEINUR 100 (A) 06/07/2019 1754   UROBILINOGEN 0.2 06/09/2013 0731   NITRITE NEGATIVE 06/07/2019 1754   LEUKOCYTESUR TRACE (A) 06/07/2019 1754    Radiological Exams on Admission: CT Angio Chest PE W/Cm &/Or Wo Cm  Result Date: 09/19/2019 CLINICAL DATA:  Shortness of breath. EXAM: CT ANGIOGRAPHY CHEST WITH CONTRAST TECHNIQUE: Multidetector CT imaging of the chest was performed using the standard protocol  during bolus administration of intravenous contrast. Multiplanar CT image reconstructions and MIPs were obtained to evaluate the vascular anatomy. CONTRAST:  119mL OMNIPAQUE IOHEXOL 350 MG/ML SOLN COMPARISON:  CT chest dated 06/10/2019. FINDINGS: Cardiovascular: Evaluation for pulmonary emboli is limited by patient body habitus, suboptimal contrast bolus timing and respiratory motion artifact.Given this limitation, no acute pulmonary embolism was detected. Detection of small segmental and subsegmental pulmonary emboli is limited. 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 The main pulmonary artery is within normal limits for size. There is no CT evidence of acute right heart strain. The visualized aorta is normal. Heart size is normal, without pericardial effusion. Mediastinum/Nodes: --No mediastinal or hilar lymphadenopathy. --No axillary lymphadenopathy. --No supraclavicular  lymphadenopathy. --Normal thyroid gland. --The esophagus is unremarkable Lungs/Pleura: There is a small focus of consolidation in the left lung apex. Right-sided volume loss with paraseptal emphysematous changes are noted. There is a mosaic appearance throughout both lung fields. There is scattered areas of consolidation involving the right lower lobe and left lower lobe. There is no pneumothorax. No large pleural effusion. The trachea is unremarkable. Upper Abdomen: No acute abnormality. Musculoskeletal: No chest wall abnormality. No acute or significant osseous findings. There is a subpleural nodule along the anterior chest wall on the left that is stable from prior studies and is likely benign. Review of the MIP images confirms the above findings. IMPRESSION: 1. Evaluation for pulmonary emboli is limited as detailed above. Given these limitations, no PE was identified. 2. 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 3. 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 4. Scattered pulmonary opacities are again noted and are consistent with multifocal pneumonia. Recurrent pulmonary infection can be seen in patients with congenital interruption of a pulmonary artery. Electronically Signed   By: Constance Holster M.D.   On: 09/19/2019 15:49   DG Chest Port 1 View  Result Date: 09/19/2019 CLINICAL DATA:  Shortness of breath EXAM: PORTABLE CHEST 1 VIEW COMPARISON:  June 10, 2019 chest radiograph and chest CT FINDINGS: There is scarring in the right base. There is no edema or consolidation. Heart is mildly enlarged with pulmonary vascularity, stable. No adenopathy. No bone lesions. IMPRESSION: Scarring right base. No edema or consolidation. Stable cardiac enlargement. No adenopathy. Electronically Signed   By: Lowella Grip III M.D.   On: 09/19/2019 14:11    EKG:  Independently reviewed.  Assessment/Plan Active Problems:   Mixed hyperlipidemia   MDD (major depressive disorder), single episode, moderate ([facility])   ADHD (attention deficit hyperactivity disorder), combined type   Hypothyroidism   Acute respiratory failure with hypoxia ([facility])   Morbid obesity with BMI of 50.0-59.9, adult ([place])   Pneumonia due to COVID-19 virus    # Acute hypoxic respiratory failure secondary to Covid pneumonia: Patient reports not using oxygen at baseline, requiring 4 L, saturation 94% Covid test +, continue airborne precautions. Start Decadron 6 mg IV daily. Start remdesivir, dosing per pharmacy. Monitor oxygen saturation and keep it above 94%. Try to wean oxygen as tolerated to maintain saturation above 94%. Remains afebrile, no leukocytosis, will hold on antibiotics for now. Daily CRP, fibrinogen, procalcitonin.   #Obstructive sleep apnea on CPAP: Discussed in detail about the compliance. Consider BiPAP if needed.  #Hypertension: Resume amlodipine and HCTZ  #Hypothyroidism Resume  Levothyroxine.  #Morbid obesity:  Needs counseling, nutrition consult outpatient  # Asthma : Continue Albuterol and  Dulera  DVT prophylaxis: Lovenox Code Status: Full code Family Communication: disussed with patient in detail Disposition Plan: Home Consults called: None Admission status: Admitted inpatient   Cipriano Bunker MD [facility]  If 7PM-7AM, please contact night-coverage [email or web address]   09/19/2019, 5:45 PM

## 2019-09-19 NOTE — ED Notes (Signed)
Pt o2 sats remained above 91-02% while ambulating

## 2019-09-19 NOTE — ED Notes (Signed)
RN does not think it would be safe to ambulate pt because he is 88% just lying in bed.

## 2019-09-20 ENCOUNTER — Encounter (HOSPITAL_COMMUNITY): Payer: Self-pay | Admitting: Family Medicine

## 2019-09-20 DIAGNOSIS — E039 Hypothyroidism, unspecified: Secondary | ICD-10-CM

## 2019-09-20 DIAGNOSIS — U071 COVID-19: Principal | ICD-10-CM

## 2019-09-20 DIAGNOSIS — J1282 Pneumonia due to coronavirus disease 2019: Secondary | ICD-10-CM

## 2019-09-20 DIAGNOSIS — I10 Essential (primary) hypertension: Secondary | ICD-10-CM

## 2019-09-20 DIAGNOSIS — J4541 Moderate persistent asthma with (acute) exacerbation: Secondary | ICD-10-CM

## 2019-09-20 DIAGNOSIS — R9389 Abnormal findings on diagnostic imaging of other specified body structures: Secondary | ICD-10-CM

## 2019-09-20 DIAGNOSIS — Z6841 Body Mass Index (BMI) 40.0 and over, adult: Secondary | ICD-10-CM

## 2019-09-20 LAB — COMPREHENSIVE METABOLIC PANEL
ALT: 45 U/L — ABNORMAL HIGH (ref 0–44)
AST: 32 U/L (ref 15–41)
Albumin: 4 g/dL (ref 3.5–5.0)
Alkaline Phosphatase: 60 U/L (ref 38–126)
Anion gap: 11 (ref 5–15)
BUN: 12 mg/dL (ref 6–20)
CO2: 25 mmol/L (ref 22–32)
Calcium: 8.9 mg/dL (ref 8.9–10.3)
Chloride: 101 mmol/L (ref 98–111)
Creatinine, Ser: 0.72 mg/dL (ref 0.61–1.24)
GFR calc Af Amer: >60 mL/min (ref >60)
GFR calc non Af Amer: >60 mL/min (ref >60)
Glucose, Bld: 142 mg/dL — ABNORMAL HIGH (ref 70–99)
Potassium: 4.4 mmol/L (ref 3.5–5.1)
Sodium: 137 mmol/L (ref 135–145)
Total Bilirubin: 0.9 mg/dL (ref 0.3–1.2)
Total Protein: 8 g/dL (ref 6.5–8.1)

## 2019-09-20 LAB — CBC WITH DIFFERENTIAL/PLATELET
Abs Immature Granulocytes: 0.04 10*3/uL (ref 0.00–0.07)
Basophils Absolute: 0 10*3/uL (ref 0.0–0.1)
Basophils Relative: 0 %
Eosinophils Absolute: 0 10*3/uL (ref 0.0–0.5)
Eosinophils Relative: 0 %
HCT: 47.7 % (ref 39.0–52.0)
Hemoglobin: 14.4 g/dL (ref 13.0–17.0)
Immature Granulocytes: 1 %
Lymphocytes Relative: 26 %
Lymphs Abs: 1.7 10*3/uL (ref 0.7–4.0)
MCH: 24.9 pg — ABNORMAL LOW (ref 26.0–34.0)
MCHC: 30.2 g/dL (ref 30.0–36.0)
MCV: 82.5 fL (ref 80.0–100.0)
Monocytes Absolute: 0.7 10*3/uL (ref 0.1–1.0)
Monocytes Relative: 11 %
Neutro Abs: 4 10*3/uL (ref 1.7–7.7)
Neutrophils Relative %: 62 %
Platelets: 380 10*3/uL (ref 150–400)
RBC: 5.78 MIL/uL (ref 4.22–5.81)
RDW: 16.2 % — ABNORMAL HIGH (ref 11.5–15.5)
WBC: 6.5 10*3/uL (ref 4.0–10.5)
nRBC: 0 % (ref 0.0–0.2)

## 2019-09-20 LAB — C-REACTIVE PROTEIN: CRP: 2.8 mg/dL — ABNORMAL HIGH (ref <1.0)

## 2019-09-20 LAB — FERRITIN: Ferritin: 65 ng/mL (ref 24–336)

## 2019-09-20 LAB — D-DIMER, QUANTITATIVE: D-Dimer, Quant: 0.56 ug/mL-FEU — ABNORMAL HIGH (ref 0.00–0.50)

## 2019-09-20 MED ORDER — PREDNISONE 10 MG (21) PO TBPK: ORAL_TABLET | ORAL | 0 refills | Status: DC

## 2019-09-20 MED ORDER — DEXAMETHASONE SODIUM PHOSPHATE 10 MG/ML IJ SOLN: 10.0000 mg | INTRAMUSCULAR | Status: DC

## 2019-09-20 MED ORDER — CHLORHEXIDINE GLUCONATE CLOTH 2 % EX PADS: 6.0000 | MEDICATED_PAD | Freq: Every day | CUTANEOUS | Status: DC

## 2019-09-20 MED ORDER — ENOXAPARIN SODIUM 40 MG/0.4ML ~~LOC~~ SOLN
40.0000 mg | Freq: Two times a day (BID) | SUBCUTANEOUS | Status: DC
  Administered 2019-09-20: 40 mg via SUBCUTANEOUS
  Filled 2019-09-20: qty 0.4

## 2019-09-20 NOTE — Discharge Summary (Signed)
Physician Discharge Summary  Benjamin Rosario YJE:[id number] DOB: [birth year] DOA: 09/19/2019  PCP: Bernerd Limbo, MD  Admit date: 09/19/2019 Discharge date: 09/20/2019  Admitted From: Home Disposition: Home  Recommendations for Outpatient Follow-up:  1. Follow up at [facility] for remdesivir infusion 1/11-1/13 2. Follow-up with PCP in 1 to 2 weeks 3. Please obtain CBC/BMP/Mag at follow up 4. Please follow up on the following pending results: None  Home Health: None Equipment/Devices: None  Discharge Condition: Stable CODE STATUS: Full code  Follow-up Information    Bernerd Limbo, MD. Schedule an appointment as soon as possible for a visit in 2 week(s).   Specialty: Family Medicine Contact information: [address] [place] [address] [phone]          Hospital Course: 19 year old male with history of ADHD, mild intermittent asthma, major depression, morbid obesity, tobacco use, OSA on CPAP, hypothyroidism and hypertension presenting with cough, congestion, headache and shortness of breath for about a week.  In ED, slightly tachycardic to 102.  Desaturated to 70% on room air requiring 4 L to recover to 94%.  COVID-19 positive.  CBC and CMP without significant finding.  Influenza PCR negative.  Marginally elevated inflammatory markers.  Procalcitonin negative.  LA 2.0.  CTA chest negative for PE but scattered pulmonary opacities consistent with multifocal pneumonia, although this was previously noted.  Patient was admitted for acute respiratory failure with hypoxia due to COVID-19 infection, and started on steroid, remdesivir and breathing treatments with improvement in his symptoms.  The next day, patient felt well and had no dyspnea or significant respiratory distress.  Ambulated on room air and maintained oxygen saturation greater than 91%.  He requested to be discharged and complete his remdesivir infusion at [facility].  Patient has been discharged on Sterapred,  #21 and home breathing treatment.  Scheduled for remdesivir infusion at to [facility] 1/11-1/13.  Counseled on infection prevention and return precautions.  See individual problem list below for more on hospital course.  Discharge Diagnoses:  Acute respiratory failure with hypoxia due to COVID-19 pneumonia: symptomatic for about a week.  Reportedly desaturated to 70% on RA on admission.  CTA chest negative for PE but scattered pulmonary opacities consistent with multifocal pneumonia although this was noted on previous CTA chest as well.  Marginally elevated inflammatory markers as below.  Procalcitonin negative.  Ambulated on room air and maintained saturation greater than 91% without distress. Recent Labs    09/19/19 1725 09/20/19 0900  DDIMER 0.69* 0.56*  FERRITIN 72 65  LDH 260*  --   CRP 1.7* 2.8*  -Decadron and remdesivir 1/9-1/10.  Discharged on [place], #21.  Will complete remdesivir infusion 1/11-1/13 at [facility]. -He is already on Advair and albuterol. -Counseled on infection prevention and return precautions.  Asthma exacerbation due to COVID-19 infection -Treatment as above.  Obstructive sleep apnea on CPAP -Patient to continue CPAP at home.  Morbid obesity: BMI 59 -Encourage lifestyle change to lose weight.  Essential hypertension: Normotensive -Resume home medications.  Tobacco use disorder: -Encourage cessation.  Hypothyroidism: -Continue Synthroid  Noncompliance: Patient is not compliant with his asthma medications and thyroid medications. -Consult   Discharge Instructions  Discharge Instructions    Diet - low sodium heart healthy   Complete by: As directed    Discharge instructions   Complete by: As directed    It has been a pleasure taking care of you! You were hospitalized and treated for COVID-19 infection. You will receive more treatment of remdesivir infusion  at the [facility] infusion center (former [facility]) for the next 3 days.     Once your  infusion is scheduled, please report to [facility] at [address]  Drive to the security guard and tell them you are here for an infusion. They will direct you to the front entrance where we will come and get you.  For questions call [phone]. It is very important that you get your infusions and take your medications as prescribed.   You are potentially infectious for the next 10 days. We recommend you isolate yourself and take the necessary precautions to prevent the virus from spreading.  Some of the steps to prevent the virus from spreading to others: Stay away from other and members of your household at least for 10-15 days. Let healthy household members care for children and pets, if possible. If you have to care for children or pets, wash your hands often and wear a mask. If possible, stay in your own room, separate from others. Use a different bathroom.Make sure that all people in your household wash their hands well and often. Leave your house only to seek medical care. Do not use public transport. Do not travel while you are sick. Wash your hands often with soap and water for 20 seconds. If soap and water are not available, use alcohol-based hand sanitizer. Cough or sneeze into a tissue or your sleeve or elbow. Do not cough or sneeze into your hand or into the air. Wear a cloth face covering or face mask.  Return precautions: Get help or return to the hospital right away if: You have trouble breathing. You have pain or pressure in your chest. You have confusion. You have bluish lips and fingernails. You have difficulty waking from sleep. You have symptoms that get worse. These symptoms may represent a serious problem that is an emergency. Do not wait to see if the symptoms will go away. Get medical help right away. Call your local emergency services (911 in the [place] Do not drive yourself to the hospital. Let the emergency medical personnel know if you think you  have COVID-19.  To protect yourself in the future:  Do not travel to areas where COVID-19 is a risk. The areas where COVID-19 is reported change often. To identify high-risk areas and travel restrictions, check the CDC travel website: StageSync.si If you live in, or must travel to, an area where COVID-19 is a risk, take precautions to avoid infection. Stay away from people who are sick. Wash your hands often with soap and water for 20 seconds. If soap and water are not available, use an alcohol-based hand sanitizer. Avoid touching your mouth, face, eyes, or nose. Avoid going out in public, follow guidance from your state and local health authorities. If you must go out in public, wear a cloth face covering or face mask. Disinfect objects and surfaces that are frequently touched every day. This may include: Counters and tables. Doorknobs and light switches. Sinks and faucets. Electronics, such as phones, remote controls, keyboards, computers, and tablets.    Where to find more information Centers for Disease Control and Prevention: StickerEmporium.tn World Health Organization: https://thompson-craig.com/   Increase activity slowly   Complete by: As directed      Allergies as of 09/20/2019   No Known Allergies     Medication List    TAKE these medications   albuterol 108 (90 Base) MCG/ACT inhaler Commonly known as: VENTOLIN HFA Inhale 2 puffs  into the lungs every 6 (six) hours as needed for wheezing or shortness of breath.   amLODipine 10 MG tablet Commonly known as: NORVASC Take 1 tablet (10 mg total) by mouth daily.   hydrochlorothiazide 12.5 MG capsule Commonly known as: MICROZIDE Take 1 capsule (12.5 mg total) by mouth daily.   levothyroxine 75 MCG tablet Commonly known as: SYNTHROID Take 1 tablet (75 mcg total) by mouth daily before breakfast.   mometasone-formoterol 100-5 MCG/ACT Aero Commonly known as:  DULERA Inhale 2 puffs into the lungs 2 (two) times daily.   predniSONE 10 MG (21) Tbpk tablet Commonly known as: STERAPRED UNI-PAK 21 TAB Per dose pack       Consultations:  None  Procedures/Studies:   CT Angio Chest PE W/Cm &/Or Wo Cm  Result Date: 09/19/2019 CLINICAL DATA:  Shortness of breath. EXAM: CT ANGIOGRAPHY CHEST WITH CONTRAST TECHNIQUE: Multidetector CT imaging of the chest was performed using the standard protocol during bolus administration of intravenous contrast. Multiplanar CT image reconstructions and MIPs were obtained to evaluate the vascular anatomy. CONTRAST:  OMNIPAQUE IOHEXOL 350 MG/ML SOLN COMPARISON:  CT chest dated 06/10/2019. FINDINGS: Cardiovascular: Evaluation for pulmonary emboli is limited by patient body habitus, suboptimal contrast bolus timing and respiratory motion artifact.Given this limitation, no acute pulmonary embolism was detected. Detection of small segmental and subsegmental pulmonary emboli is limited. 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 The main pulmonary artery is within normal limits for size. There is no CT evidence of acute right heart strain. The visualized aorta is normal. Heart size is normal, without pericardial effusion. Mediastinum/Nodes: --No mediastinal or hilar lymphadenopathy. --No axillary lymphadenopathy. --No supraclavicular lymphadenopathy. --Normal thyroid gland. --The esophagus is unremarkable Lungs/Pleura: There is a small focus of consolidation in the left lung apex. Right-sided volume loss with paraseptal emphysematous changes are noted. There is a mosaic appearance throughout both lung fields. There is scattered areas of consolidation involving the right lower lobe and left lower lobe. There is no pneumothorax. No large pleural effusion. The trachea is unremarkable. Upper Abdomen: No acute abnormality. Musculoskeletal: No chest wall abnormality. No acute or significant osseous  findings. There is a subpleural nodule along the anterior chest wall on the left that is stable from prior studies and is likely benign. Review of the MIP images confirms the above findings. IMPRESSION: 1. Evaluation for pulmonary emboli is limited as detailed above. Given these limitations, no PE was identified. 2. 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 3. 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 4. Scattered pulmonary opacities are again noted and are consistent with multifocal pneumonia. Recurrent pulmonary infection can be seen in patients with congenital interruption of a pulmonary artery. Electronically Signed   By: Katherine Mantle M.D.   On: 09/19/2019 15:49   DG Chest Port 1 View  Result Date: 09/19/2019 CLINICAL DATA:  Shortness of breath EXAM: PORTABLE CHEST 1 VIEW COMPARISON:  June 10, 2019 chest radiograph and chest CT FINDINGS: There is scarring in the right base. There is no edema or consolidation. Heart is mildly enlarged with pulmonary vascularity, stable. No adenopathy. No bone lesions. IMPRESSION: Scarring right base. No edema or consolidation. Stable cardiac enlargement. No adenopathy. Electronically Signed   By: Bretta Bang III M.D.   On: 09/19/2019 14:11       Discharge Exam: Vitals:   09/20/19 0810 09/20/19 0900  BP: Marland Kitchen)  163/54 (!) 149/36  Pulse: 87 88  Resp:  19  Temp:    SpO2: 95% 99%    GENERAL: No acute distress.  Appears well.  HEENT: MMM.  Vision and hearing grossly intact.  NECK: Supple. Acanthosis nigricans. RESP:  No IWOB.  Fair aeration bilaterally but limited exam due to body habitus. CVS:  RRR. Heart sounds normal.  ABD/GI/GU: Bowel sounds present. Soft. Non tender.  MSK/EXT:  Moves extremities. No apparent deformity or edema.  SKIN: no apparent skin lesion or wound NEURO: Awake, alert and oriented  appropriately.  No apparent focal neuro deficit. PSYCH: Calm. Normal affect.   The results of significant diagnostics from this hospitalization (including imaging, microbiology, ancillary and laboratory) are listed below for reference.     Microbiology: Recent Results (from the past 240 hour(s))  Respiratory Panel by RT PCR (Flu A&B, Covid) - Nasopharyngeal Swab     Status: Abnormal   Collection Time: 09/19/19  2:29 PM   Specimen: Nasopharyngeal Swab  Result Value Ref Range Status   SARS Coronavirus 2 by RT PCR POSITIVE (A) NEGATIVE Final    Comment: RESULT CALLED TO, READ BACK BY AND VERIFIED WITH: BARHAM, RN @1611  ON 09/19/2019 JACKSON,K (NOTE) SARS-CoV-2 target nucleic acids are DETECTED. SARS-CoV-2 RNA is generally detectable in upper respiratory specimens  during the acute phase of infection. Positive results are indicative of the presence of the identified virus, but do not rule out bacterial infection or co-infection with other pathogens not detected by the test. Clinical correlation with patient history and other diagnostic information is necessary to determine patient infection status. The expected result is Negative. Fact Sheet for Patients:  https://www.moore.com/https://www.fda.gov/media/142436/download Fact Sheet for Healthcare Providers: https://www.young.biz/https://www.fda.gov/media/142435/download This test is not yet approved or cleared by the [place] FDA and  has been authorized for detection and/or diagnosis of SARS-CoV-2 by FDA under an Emergency Use Authorization (EUA).  This EUA will remain in effect (meaning this test can be used ) for the duration of  the COVID-19 declaration under Section 564(b)(1) of the Act, 21 U.S.C. section 360bbb-3(b)(1), unless the authorization is terminated or revoked sooner.    Influenza A by PCR NEGATIVE NEGATIVE Final   Influenza B by PCR NEGATIVE NEGATIVE Final    Comment: (NOTE) The Xpert Xpress SARS-CoV-2/FLU/RSV assay is intended as an aid in  the diagnosis  of influenza from Nasopharyngeal swab specimens and  should not be used as a sole basis for treatment. Nasal washings and  aspirates are unacceptable for Xpert Xpress SARS-CoV-2/FLU/RSV  testing. Fact Sheet for Patients: https://www.moore.com/https://www.fda.gov/media/142436/download Fact Sheet for Healthcare Providers: https://www.young.biz/https://www.fda.gov/media/142435/download This test is not yet approved or cleared by the [place] FDA and  has been authorized for detection and/or diagnosis of SARS-CoV-2 by  FDA under an Emergency Use Authorization (EUA). This EUA will remain  in effect (meaning this test can be used) for the duration of the  Covid-19 declaration under Section 564(b)(1) of the Act, 21  U.S.C. section 360bbb-3(b)(1), unless the authorization is  terminated or revoked. Performed at [facility], [address] [place], [place] [id number]      Labs: BNP (last 3 results) Recent Labs    06/08/19 1350  BNP 87.4   Basic Metabolic Panel: Recent Labs  Lab 09/19/19 1429 09/19/19 1725 09/20/19 0900  NA 141  --  137  K 4.3  --  4.4  CL 103  --  101  CO2 27  --  25  GLUCOSE 97  --  142*  BUN 10  --  12  CREATININE 1.03 1.03 0.72  CALCIUM 8.7*  --  8.9   Liver Function Tests: Recent Labs  Lab 09/20/19 0900  AST 32  ALT 45*  ALKPHOS 60  BILITOT 0.9  PROT 8.0  ALBUMIN 4.0   No results for input(s): LIPASE, AMYLASE in the last 168 hours. No results for input(s): AMMONIA in the last 168 hours. CBC: Recent Labs  Lab 09/19/19 1429 09/19/19 1725 09/20/19 0900  WBC 9.4 8.7 6.5  NEUTROABS 6.6  --  4.0  HGB 14.7 15.1 14.4  HCT 49.1 50.4 47.7  MCV 84.2 83.2 82.5  PLT 357 376 380   Cardiac Enzymes: No results for input(s): CKTOTAL, CKMB, CKMBINDEX, TROPONINI in the last 168 hours. BNP: Invalid input(s): POCBNP CBG: No results for input(s): GLUCAP in the last 168 hours. D-Dimer Recent Labs    09/19/19 1725 09/20/19 0900  DDIMER 0.69* 0.56*   Hgb A1c No  results for input(s): HGBA1C in the last 72 hours. Lipid Profile Recent Labs    09/19/19 1725  TRIG 112   Thyroid function studies No results for input(s): TSH, T4TOTAL, T3FREE, THYROIDAB in the last 72 hours.  Invalid input(s): FREET3 Anemia work up Recent Labs    09/19/19 1725 09/20/19 0900  FERRITIN 72 65   Urinalysis    Component Value Date/Time   COLORURINE YELLOW 06/07/2019 1754   APPEARANCEUR CLEAR 06/07/2019 1754   LABSPEC 1.021 06/07/2019 1754   PHURINE 6.0 06/07/2019 1754   GLUCOSEU NEGATIVE 06/07/2019 1754   HGBUR NEGATIVE 06/07/2019 1754   BILIRUBINUR NEGATIVE 06/07/2019 1754   KETONESUR NEGATIVE 06/07/2019 1754   PROTEINUR 100 (A) 06/07/2019 1754   UROBILINOGEN 0.2 06/09/2013 0731   NITRITE NEGATIVE 06/07/2019 1754   LEUKOCYTESUR TRACE (A) 06/07/2019 1754   Sepsis Labs Invalid input(s): PROCALCITONIN,  WBC,  LACTICIDVEN   Time coordinating discharge: 35 minutes  SIGNED:  Almon Hercules, MD  [facility] 09/20/2019, 10:33 AM  If 7PM-7AM, please contact night-coverage [email or web address] Password [email or web address]

## 2019-09-20 NOTE — Discharge Instructions (Signed)
You are scheduled for an outpatient infusion of Remdesivir at [facility] on Monday 1/11, Tuesday, 1/12, and Wednesday 1/13,   Please report to [facility] at [address]  Drive to the security guard and tell them you are here for an infusion. They will direct you to the front entrance where we will come and get you.  For questions call [phone].  Thanks

## 2019-09-20 NOTE — Progress Notes (Signed)
Patient scheduled for outpatient Remdesivir infusion at [facility] on Monday 1/11, Tuesday, 1/12, and Wednesday 1/13,  Please advise them to report to [facility] at [address]  Drive to the security guard and tell them you are here for an infusion. They will direct you to the front entrance where we will come and get you.  For questions call [phone].  Thanks

## 2019-09-20 NOTE — Progress Notes (Signed)
Per Maralyn Sago, charge RN, pt was 92% on RA while ambulating. Pt was also in no distress, per RN.  VWilliams,RN.

## 2019-09-20 NOTE — Progress Notes (Signed)
Pt discharged to home.  DC instructions given. Pt encouraged to stop by pharmacy and pick up medication that was e-prescribed by MD. Also encouraged to make f/u Remdesivir infusion at [facility] tomorrow, Tuesday and Wednesday. Importance of infusion emphasized. Pt voiced understanding. Left unit in wheelchair pushed by Maralyn Sago, RN and accompanied by NT. Left in stable condition.  VWilliams,RN.

## 2019-09-21 ENCOUNTER — Ambulatory Visit (HOSPITAL_COMMUNITY)
Admission: RE | Admit: 2019-09-21 | Discharge: 2019-09-21 | Disposition: A | Discharge status: Home / Self Care | Payer: No Typology Code available for payment source | Source: Ambulatory Visit | Attending: Pulmonary Disease | Admitting: Pulmonary Disease

## 2019-09-21 VITALS — BP 113/70 | HR 100 | Temp 98.4°F | Resp 18

## 2019-09-21 DIAGNOSIS — U071 COVID-19: Secondary | ICD-10-CM | POA: Insufficient documentation

## 2019-09-21 DIAGNOSIS — J1282 Pneumonia due to coronavirus disease 2019: Secondary | ICD-10-CM | POA: Insufficient documentation

## 2019-09-21 MED ORDER — FAMOTIDINE IN NACL 20-0.9 MG/50ML-% IV SOLN: 20.0000 mg | Freq: Once | INTRAVENOUS | Status: DC | PRN

## 2019-09-21 MED ORDER — EPINEPHRINE 0.3 MG/0.3ML IJ SOAJ: 0.3000 mg | Freq: Once | INTRAMUSCULAR | Status: DC | PRN

## 2019-09-21 MED ORDER — ALBUTEROL SULFATE HFA 108 (90 BASE) MCG/ACT IN AERS: 2.0000 | INHALATION_SPRAY | Freq: Once | RESPIRATORY_TRACT | Status: DC | PRN

## 2019-09-21 MED ORDER — METHYLPREDNISOLONE SODIUM SUCC 125 MG IJ SOLR: 125.0000 mg | Freq: Once | INTRAMUSCULAR | Status: DC | PRN

## 2019-09-21 MED ORDER — SODIUM CHLORIDE 0.9 % IV SOLN: INTRAVENOUS | Status: DC | PRN

## 2019-09-21 MED ORDER — SODIUM CHLORIDE 0.9 % IV SOLN
100.0000 mg | Freq: Once | INTRAVENOUS | Status: DC
  Administered 2019-09-21: 14:00:00 100 mg via INTRAVENOUS

## 2019-09-21 MED ORDER — DIPHENHYDRAMINE HCL 50 MG/ML IJ SOLN: 50.0000 mg | Freq: Once | INTRAMUSCULAR | Status: DC | PRN

## 2019-09-21 MED ORDER — SODIUM CHLORIDE 0.9 % IV SOLN: 100.0000 mg | Freq: Once | INTRAVENOUS | Status: DC

## 2019-09-21 MED ORDER — SODIUM CHLORIDE 0.9 % IV SOLN
INTRAVENOUS | Status: AC
  Filled 2019-09-21: qty 20

## 2019-09-21 NOTE — Progress Notes (Signed)
  Diagnosis: COVID-19  Physician: Dr. Delford Field  Procedure: Covid Infusion Clinic Med: remdesivir infusion.  Complications: No immediate complications noted.  Discharge: Discharged home   Reginia Forts Albarece 09/21/2019

## 2019-09-21 NOTE — Discharge Instructions (Signed)
10 Things You Can Do to Manage Your COVID-19 Symptoms at Home If you have possible or confirmed COVID-19: 1. Stay home from work and school. And stay away from other public places. If you must go out, avoid using any kind of public transportation, ridesharing, or taxis. 2. Monitor your symptoms carefully. If your symptoms get worse, call your healthcare provider immediately. 3. Get rest and stay hydrated. 4. If you have a medical appointment, call the healthcare provider ahead of time and tell them that you have or may have COVID-19. 5. For medical emergencies, call 911 and notify the dispatch personnel that you have or may have COVID-19. 6. Cover your cough and sneezes with a tissue or use the inside of your elbow. 7. Wash your hands often with soap and water for at least 20 seconds or clean your hands with an alcohol-based hand sanitizer that contains at least 60% alcohol. 8. As much as possible, stay in a specific room and away from other people in your home. Also, you should use a separate bathroom, if available. If you need to be around other people in or outside of the home, wear a mask. 9. Avoid sharing personal items with other people in your household, like dishes, towels, and bedding. 10. Clean all surfaces that are touched often, like counters, tabletops, and doorknobs. Use household cleaning sprays or wipes according to the label instructions. cdc.gov/coronavirus 03/11/2019 This information is not intended to replace advice given to you by your health care provider. Make sure you discuss any questions you have with your health care provider. Document Revised: 08/13/2019 Document Reviewed: 08/13/2019 Elsevier Patient Education  2020 Elsevier Inc.  

## 2019-09-22 ENCOUNTER — Ambulatory Visit (HOSPITAL_COMMUNITY)
Admit: 2019-09-22 | Discharge: 2019-09-22 | Disposition: A | Discharge status: Home / Self Care | Payer: No Typology Code available for payment source | Attending: Pulmonary Disease | Admitting: Pulmonary Disease

## 2019-09-22 DIAGNOSIS — J1282 Pneumonia due to coronavirus disease 2019: Secondary | ICD-10-CM

## 2019-09-23 ENCOUNTER — Ambulatory Visit (HOSPITAL_COMMUNITY): Payer: No Typology Code available for payment source

## 2019-09-24 LAB — CULTURE, BLOOD (ROUTINE X 2)
Culture: NO GROWTH
Culture: NO GROWTH
Special Requests: ADEQUATE
Special Requests: ADEQUATE

## 2019-12-10 ENCOUNTER — Encounter (HOSPITAL_COMMUNITY): Payer: Self-pay

## 2019-12-10 ENCOUNTER — Inpatient Hospital Stay (HOSPITAL_COMMUNITY)
Admission: EM | Admit: 2019-12-10 | Discharge: 2019-12-16 | DRG: 208 | Disposition: A | Discharge status: Home / Self Care | Payer: No Typology Code available for payment source | Attending: Internal Medicine | Admitting: Internal Medicine

## 2019-12-10 ENCOUNTER — Inpatient Hospital Stay (HOSPITAL_COMMUNITY): Payer: No Typology Code available for payment source

## 2019-12-10 ENCOUNTER — Other Ambulatory Visit: Payer: Self-pay

## 2019-12-10 ENCOUNTER — Emergency Department (HOSPITAL_COMMUNITY): Payer: No Typology Code available for payment source

## 2019-12-10 DIAGNOSIS — R7303 Prediabetes: Secondary | ICD-10-CM | POA: Diagnosis not present

## 2019-12-10 DIAGNOSIS — I10 Essential (primary) hypertension: Secondary | ICD-10-CM | POA: Diagnosis not present

## 2019-12-10 DIAGNOSIS — J181 Lobar pneumonia, unspecified organism: Secondary | ICD-10-CM

## 2019-12-10 DIAGNOSIS — Z720 Tobacco use: Secondary | ICD-10-CM

## 2019-12-10 DIAGNOSIS — J159 Unspecified bacterial pneumonia: Principal | ICD-10-CM | POA: Diagnosis present

## 2019-12-10 DIAGNOSIS — F902 Attention-deficit hyperactivity disorder, combined type: Secondary | ICD-10-CM | POA: Diagnosis present

## 2019-12-10 DIAGNOSIS — J438 Other emphysema: Secondary | ICD-10-CM | POA: Diagnosis present

## 2019-12-10 DIAGNOSIS — J9602 Acute respiratory failure with hypercapnia: Secondary | ICD-10-CM | POA: Diagnosis not present

## 2019-12-10 DIAGNOSIS — Q2579 Other congenital malformations of pulmonary artery: Secondary | ICD-10-CM | POA: Diagnosis not present

## 2019-12-10 DIAGNOSIS — J9622 Acute and chronic respiratory failure with hypercapnia: Secondary | ICD-10-CM | POA: Diagnosis present

## 2019-12-10 DIAGNOSIS — Z23 Encounter for immunization: Secondary | ICD-10-CM

## 2019-12-10 DIAGNOSIS — R778 Other specified abnormalities of plasma proteins: Secondary | ICD-10-CM | POA: Diagnosis not present

## 2019-12-10 DIAGNOSIS — Z9114 Patient's other noncompliance with medication regimen: Secondary | ICD-10-CM

## 2019-12-10 DIAGNOSIS — E662 Morbid (severe) obesity with alveolar hypoventilation: Secondary | ICD-10-CM | POA: Diagnosis present

## 2019-12-10 DIAGNOSIS — J9601 Acute respiratory failure with hypoxia: Secondary | ICD-10-CM | POA: Diagnosis present

## 2019-12-10 DIAGNOSIS — Z833 Family history of diabetes mellitus: Secondary | ICD-10-CM

## 2019-12-10 DIAGNOSIS — E875 Hyperkalemia: Secondary | ICD-10-CM | POA: Diagnosis present

## 2019-12-10 DIAGNOSIS — G4733 Obstructive sleep apnea (adult) (pediatric): Secondary | ICD-10-CM

## 2019-12-10 DIAGNOSIS — F329 Major depressive disorder, single episode, unspecified: Secondary | ICD-10-CM

## 2019-12-10 DIAGNOSIS — R0602 Shortness of breath: Secondary | ICD-10-CM

## 2019-12-10 DIAGNOSIS — Z01818 Encounter for other preprocedural examination: Secondary | ICD-10-CM

## 2019-12-10 DIAGNOSIS — Z7951 Long term (current) use of inhaled steroids: Secondary | ICD-10-CM

## 2019-12-10 DIAGNOSIS — Z8701 Personal history of pneumonia (recurrent): Secondary | ICD-10-CM | POA: Diagnosis not present

## 2019-12-10 DIAGNOSIS — Z6841 Body Mass Index (BMI) 40.0 and over, adult: Secondary | ICD-10-CM | POA: Diagnosis not present

## 2019-12-10 DIAGNOSIS — Z9981 Dependence on supplemental oxygen: Secondary | ICD-10-CM | POA: Diagnosis not present

## 2019-12-10 DIAGNOSIS — Z8249 Family history of ischemic heart disease and other diseases of the circulatory system: Secondary | ICD-10-CM

## 2019-12-10 DIAGNOSIS — E039 Hypothyroidism, unspecified: Secondary | ICD-10-CM | POA: Diagnosis present

## 2019-12-10 DIAGNOSIS — L83 Acanthosis nigricans: Secondary | ICD-10-CM | POA: Diagnosis not present

## 2019-12-10 DIAGNOSIS — Z978 Presence of other specified devices: Secondary | ICD-10-CM

## 2019-12-10 DIAGNOSIS — Q256 Stenosis of pulmonary artery: Secondary | ICD-10-CM | POA: Diagnosis not present

## 2019-12-10 DIAGNOSIS — J9621 Acute and chronic respiratory failure with hypoxia: Secondary | ICD-10-CM | POA: Diagnosis present

## 2019-12-10 DIAGNOSIS — Y95 Nosocomial condition: Secondary | ICD-10-CM | POA: Diagnosis present

## 2019-12-10 DIAGNOSIS — Z8616 Personal history of COVID-19: Secondary | ICD-10-CM | POA: Diagnosis not present

## 2019-12-10 DIAGNOSIS — J45909 Unspecified asthma, uncomplicated: Secondary | ICD-10-CM | POA: Diagnosis not present

## 2019-12-10 DIAGNOSIS — Z20822 Contact with and (suspected) exposure to covid-19: Secondary | ICD-10-CM | POA: Diagnosis present

## 2019-12-10 DIAGNOSIS — E1165 Type 2 diabetes mellitus with hyperglycemia: Secondary | ICD-10-CM | POA: Diagnosis not present

## 2019-12-10 DIAGNOSIS — F909 Attention-deficit hyperactivity disorder, unspecified type: Secondary | ICD-10-CM

## 2019-12-10 DIAGNOSIS — Z9119 Patient's noncompliance with other medical treatment and regimen: Secondary | ICD-10-CM

## 2019-12-10 DIAGNOSIS — Z9911 Dependence on respirator [ventilator] status: Secondary | ICD-10-CM

## 2019-12-10 DIAGNOSIS — R7989 Other specified abnormal findings of blood chemistry: Secondary | ICD-10-CM | POA: Diagnosis present

## 2019-12-10 DIAGNOSIS — E119 Type 2 diabetes mellitus without complications: Secondary | ICD-10-CM | POA: Diagnosis not present

## 2019-12-10 DIAGNOSIS — E782 Mixed hyperlipidemia: Secondary | ICD-10-CM | POA: Diagnosis present

## 2019-12-10 DIAGNOSIS — N179 Acute kidney failure, unspecified: Secondary | ICD-10-CM | POA: Diagnosis not present

## 2019-12-10 DIAGNOSIS — J189 Pneumonia, unspecified organism: Secondary | ICD-10-CM | POA: Diagnosis not present

## 2019-12-10 LAB — URINALYSIS, ROUTINE W REFLEX MICROSCOPIC
Bacteria, UA: NONE SEEN
Bilirubin Urine: NEGATIVE
Glucose, UA: NEGATIVE mg/dL
Hgb urine dipstick: NEGATIVE
Ketones, ur: NEGATIVE mg/dL
Leukocytes,Ua: NEGATIVE
Nitrite: NEGATIVE
Protein, ur: >=300 mg/dL — AB
Specific Gravity, Urine: 1.033 — ABNORMAL HIGH (ref 1.005–1.030)
pH: 6 (ref 5.0–8.0)

## 2019-12-10 LAB — TROPONIN I (HIGH SENSITIVITY)
Troponin I (High Sensitivity): 66 ng/L — ABNORMAL HIGH (ref <18)
Troponin I (High Sensitivity): 127 ng/L (ref <18)
Troponin I (High Sensitivity): 119 ng/L (ref <18)

## 2019-12-10 LAB — CBC WITH DIFFERENTIAL/PLATELET
Abs Immature Granulocytes: 0.04 10*3/uL (ref 0.00–0.07)
Basophils Absolute: 0.1 10*3/uL (ref 0.0–0.1)
Basophils Relative: 1 %
Eosinophils Absolute: 0.3 10*3/uL (ref 0.0–0.5)
Eosinophils Relative: 2 %
HCT: 46.7 % (ref 39.0–52.0)
Hemoglobin: 14.3 g/dL (ref 13.0–17.0)
Immature Granulocytes: 0 %
Lymphocytes Relative: 22 %
Lymphs Abs: 2.8 10*3/uL (ref 0.7–4.0)
MCH: 25 pg — ABNORMAL LOW (ref 26.0–34.0)
MCHC: 30.6 g/dL (ref 30.0–36.0)
MCV: 81.8 fL (ref 80.0–100.0)
Monocytes Absolute: 0.7 10*3/uL (ref 0.1–1.0)
Monocytes Relative: 6 %
Neutro Abs: 8.9 10*3/uL — ABNORMAL HIGH (ref 1.7–7.7)
Neutrophils Relative %: 69 %
Platelets: 383 10*3/uL (ref 150–400)
RBC: 5.71 MIL/uL (ref 4.22–5.81)
RDW: 16.3 % — ABNORMAL HIGH (ref 11.5–15.5)
WBC: 12.8 10*3/uL — ABNORMAL HIGH (ref 4.0–10.5)
nRBC: 0 % (ref 0.0–0.2)

## 2019-12-10 LAB — BASIC METABOLIC PANEL
Anion gap: 12 (ref 5–15)
BUN: 12 mg/dL (ref 6–20)
CO2: 24 mmol/L (ref 22–32)
Calcium: 8.7 mg/dL — ABNORMAL LOW (ref 8.9–10.3)
Chloride: 101 mmol/L (ref 98–111)
Creatinine, Ser: 0.95 mg/dL (ref 0.61–1.24)
GFR calc Af Amer: >60 mL/min (ref >60)
GFR calc non Af Amer: >60 mL/min (ref >60)
Glucose, Bld: 133 mg/dL — ABNORMAL HIGH (ref 70–99)
Potassium: 4 mmol/L (ref 3.5–5.1)
Sodium: 137 mmol/L (ref 135–145)

## 2019-12-10 LAB — MAGNESIUM: Magnesium: 1.7 mg/dL (ref 1.7–2.4)

## 2019-12-10 LAB — D-DIMER, QUANTITATIVE: D-Dimer, Quant: 0.57 ug/mL-FEU — ABNORMAL HIGH (ref 0.00–0.50)

## 2019-12-10 LAB — POCT I-STAT 7, (LYTES, BLD GAS, ICA,H+H)
Acid-Base Excess: 1 mmol/L (ref 0.0–2.0)
Bicarbonate: 26.6 mmol/L (ref 20.0–28.0)
Calcium, Ion: 1.22 mmol/L (ref 1.15–1.40)
HCT: 46 % (ref 39.0–52.0)
Hemoglobin: 15.6 g/dL (ref 13.0–17.0)
O2 Saturation: 99 %
Potassium: 3.9 mmol/L (ref 3.5–5.1)
Sodium: 138 mmol/L (ref 135–145)
TCO2: 28 mmol/L (ref 22–32)
pCO2 arterial: 46 mmHg (ref 32.0–48.0)
pH, Arterial: 7.37 (ref 7.350–7.450)
pO2, Arterial: 127 mmHg — ABNORMAL HIGH (ref 83.0–108.0)

## 2019-12-10 LAB — HEPATIC FUNCTION PANEL
ALT: 27 U/L (ref 0–44)
AST: 25 U/L (ref 15–41)
Albumin: 3.4 g/dL — ABNORMAL LOW (ref 3.5–5.0)
Alkaline Phosphatase: 64 U/L (ref 38–126)
Bilirubin, Direct: <0.1 mg/dL (ref 0.0–0.2)
Total Bilirubin: 0.5 mg/dL (ref 0.3–1.2)
Total Protein: 6.4 g/dL — ABNORMAL LOW (ref 6.5–8.1)

## 2019-12-10 LAB — LACTIC ACID, PLASMA
Lactic Acid, Venous: 2 mmol/L (ref 0.5–1.9)
Lactic Acid, Venous: 2.3 mmol/L (ref 0.5–1.9)

## 2019-12-10 LAB — RESPIRATORY PANEL BY RT PCR (FLU A&B, COVID)
Influenza A by PCR: NEGATIVE
Influenza B by PCR: NEGATIVE
SARS Coronavirus 2 by RT PCR: NEGATIVE

## 2019-12-10 LAB — PROTIME-INR
INR: 1 (ref 0.8–1.2)
Prothrombin Time: 13 seconds (ref 11.4–15.2)

## 2019-12-10 LAB — PROCALCITONIN: Procalcitonin: <0.1 ng/mL

## 2019-12-10 LAB — APTT: aPTT: 28 seconds (ref 24–36)

## 2019-12-10 MED ORDER — LACTATED RINGERS IV BOLUS
1000.0000 mL | Freq: Once | INTRAVENOUS | Status: AC
  Administered 2019-12-10: 1000 mL via INTRAVENOUS

## 2019-12-10 MED ORDER — SODIUM CHLORIDE 0.9 % IV SOLN
500.0000 mg | INTRAVENOUS | Status: DC
  Administered 2019-12-10: 500 mg via INTRAVENOUS
  Filled 2019-12-10 (×2): qty 500

## 2019-12-10 MED ORDER — ALBUTEROL SULFATE HFA 108 (90 BASE) MCG/ACT IN AERS
10.0000 | INHALATION_SPRAY | RESPIRATORY_TRACT | Status: DC
  Administered 2019-12-10 (×2): 10 via RESPIRATORY_TRACT
  Filled 2019-12-10: qty 6.7

## 2019-12-10 MED ORDER — MOMETASONE FURO-FORMOTEROL FUM 100-5 MCG/ACT IN AERO
2.0000 | INHALATION_SPRAY | Freq: Two times a day (BID) | RESPIRATORY_TRACT | Status: DC
  Filled 2019-12-10: qty 8.8

## 2019-12-10 MED ORDER — ALBUTEROL SULFATE (2.5 MG/3ML) 0.083% IN NEBU: 5.0000 mg | INHALATION_SOLUTION | Freq: Once | RESPIRATORY_TRACT | Status: DC

## 2019-12-10 MED ORDER — IOHEXOL 350 MG/ML SOLN
75.0000 mL | Freq: Once | INTRAVENOUS | Status: AC | PRN
  Administered 2019-12-10: 75 mL via INTRAVENOUS

## 2019-12-10 MED ORDER — METHYLPREDNISOLONE SODIUM SUCC 125 MG IJ SOLR
125.0000 mg | Freq: Once | INTRAMUSCULAR | Status: AC
  Administered 2019-12-10: 125 mg via INTRAVENOUS
  Filled 2019-12-10: qty 2

## 2019-12-10 MED ORDER — ENOXAPARIN SODIUM 40 MG/0.4ML ~~LOC~~ SOLN
40.0000 mg | SUBCUTANEOUS | Status: DC
  Administered 2019-12-10: 40 mg via SUBCUTANEOUS
  Filled 2019-12-10: qty 0.4

## 2019-12-10 MED ORDER — ALBUTEROL SULFATE HFA 108 (90 BASE) MCG/ACT IN AERS
8.0000 | INHALATION_SPRAY | RESPIRATORY_TRACT | Status: DC
  Administered 2019-12-11: 8 via RESPIRATORY_TRACT
  Filled 2019-12-10: qty 6.7

## 2019-12-10 MED ORDER — SODIUM CHLORIDE 0.9 % IV SOLN
2.0000 g | INTRAVENOUS | Status: DC
  Administered 2019-12-10: 2 g via INTRAVENOUS
  Filled 2019-12-10 (×2): qty 20

## 2019-12-10 MED ORDER — AEROCHAMBER PLUS FLO-VU LARGE MISC
Status: AC
  Filled 2019-12-10: qty 1

## 2019-12-10 MED ORDER — LEVOTHYROXINE SODIUM 25 MCG PO TABS
75.0000 ug | ORAL_TABLET | Freq: Every day | ORAL | Status: DC
  Administered 2019-12-11: 75 ug via ORAL
  Filled 2019-12-10: qty 3

## 2019-12-10 MED ORDER — FUROSEMIDE 10 MG/ML IJ SOLN: 40.0000 mg | Freq: Once | INTRAMUSCULAR | Status: DC

## 2019-12-10 MED ORDER — HYDROCHLOROTHIAZIDE 12.5 MG PO CAPS
12.5000 mg | ORAL_CAPSULE | Freq: Every day | ORAL | Status: DC
  Filled 2019-12-10: qty 1

## 2019-12-10 MED ORDER — AEROCHAMBER PLUS FLO-VU LARGE MISC
1.0000 | Freq: Once | Status: AC
  Administered 2019-12-10: 1

## 2019-12-10 MED ORDER — IPRATROPIUM BROMIDE 0.02 % IN SOLN: 0.5000 mg | Freq: Once | RESPIRATORY_TRACT | Status: DC

## 2019-12-10 MED ORDER — IPRATROPIUM BROMIDE HFA 17 MCG/ACT IN AERS
5.0000 | INHALATION_SPRAY | Freq: Once | RESPIRATORY_TRACT | Status: AC
  Administered 2019-12-10: 5 via RESPIRATORY_TRACT
  Filled 2019-12-10: qty 12.9

## 2019-12-10 MED ORDER — AMLODIPINE BESYLATE 10 MG PO TABS
10.0000 mg | ORAL_TABLET | Freq: Every day | ORAL | Status: DC
  Administered 2019-12-10: 10 mg via ORAL
  Filled 2019-12-10: qty 1
  Filled 2019-12-10: qty 2

## 2019-12-10 NOTE — Progress Notes (Signed)
Removed patient from bipap and placed patient on 100% NRB to transport patient to [facility]. Covid-19 test requested.

## 2019-12-10 NOTE — Progress Notes (Signed)
Placed patient on BIPAP due to increased work of breathing and decrease in Sp02 levels. Nurse notified.

## 2019-12-10 NOTE — ED Provider Notes (Signed)
[facility] EMERGENCY DEPARTMENT Provider Note   CSN: [id number] Arrival date & time: 12/10/19  1538     History Chief Complaint  Patient presents with   Chest Pain   Shortness of Breath    Benjamin Rosario is a 19 y.o. male.  Patient with history of asthma, pneumonia, Covid diagnosis 82 days ago, allergies --presents the emergency department with worsening shortness of breath and cough starting this morning.  Patient complains of productive cough with yellow mucus.  EMS was called and patient was found to be in the 70% range on room air.  No fevers.  He has had increasing wheezing and chest soreness with the cough.  No hemoptysis.  States he tried albuterol nebulizer treatments at home without much improvement.  No worsening lower extremity swelling.  He has had similar symptoms in the past with pneumonia.  He is afraid that he has recurrent pneumonia.   The onset of this condition was acute. The course is constant. Aggravating factors: none. Alleviating factors: none.  No Covid vaccine.         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/25/2018   Environmental allergies    Hypothyroidism 11/25/2018    Patient Active Problem List   Diagnosis Date Noted   Pneumonia due to COVID-19 virus 09/19/2019   Chronic respiratory failure with hypoxia (HCC)    Acute respiratory failure with hypoxia (HCC) 06/10/2019   Hypokalemia 06/10/2019   Morbid obesity with BMI of 50.0-59.9, adult (HCC) 06/10/2019   Acute respiratory failure with hypoxemia (HCC) 06/07/2019   Community acquired pneumonia 06/07/2019   Hypothyroidism 11/25/2018   Chlamydia 11/25/2018   SOB (shortness of breath) 11/25/2018   Chest pain 11/25/2018   Emphysema lung (HCC) 11/25/2018   Concussion with no loss of consciousness 07/27/2015   ADHD (attention deficit hyperactivity  disorder), combined type 06/07/2013   MDD (major depressive disorder), single episode, moderate (HCC) 06/06/2013   Mixed hyperlipidemia 02/26/2011   Obesity 02/26/2011   Pre-diabetes 02/26/2011   Other specified acquired hypothyroidism 02/26/2011    History reviewed. No pertinent surgical history.     Family History  Problem Relation Age of Onset   Hypertension Father    Diabetes Father     Social History   Tobacco Use   Smoking status: Never Smoker   Smokeless tobacco: Never Used  Substance Use Topics   Alcohol use: No    Alcohol/week: 0.0 standard drinks   Drug use: Yes    Types: Marijuana    Home Medications Prior to Admission medications   Medication Sig Start Date End Date Taking? Authorizing Provider  albuterol (PROVENTIL HFA;VENTOLIN HFA) 108 (90 Base) MCG/ACT inhaler Inhale 2 puffs into the lungs every 6 (six) hours as needed for wheezing or shortness of breath. 11/28/18   Purohit, Salli Quarry, MD  amLODipine (NORVASC) 10 MG tablet Take 1 tablet (10 mg total) by mouth daily. 06/15/19   Almon Hercules, MD  hydrochlorothiazide (MICROZIDE) 12.5 MG capsule Take 1 capsule (12.5 mg total) by mouth daily. 06/15/19   Almon Hercules, MD  levothyroxine (SYNTHROID) 75 MCG tablet Take 1 tablet (75 mcg total) by mouth daily before breakfast. 06/09/19   Rai, Ripudeep K, MD  mometasone-formoterol (DULERA) 100-5 MCG/ACT AERO Inhale 2 puffs into the lungs 2 (two) times daily. 06/09/19   Rai, Delene Ruffini, MD  predniSONE Albesa Seen Carlus Pavlov  21 TAB) 10 MG (21) TBPK tablet Per dose pack Patient taking differently: Take 10 mg by mouth See admin instructions. Per dose pack 09/20/19   Almon Hercules, MD    Allergies    Patient has no known allergies.  Review of Systems   Review of Systems  Constitutional: Negative for fever.  HENT: Negative for rhinorrhea and sore throat.   Eyes: Negative for redness.  Respiratory: Positive for cough and shortness of breath.   Cardiovascular: Positive  for chest pain (soreness).  Gastrointestinal: Negative for abdominal pain, diarrhea, nausea and vomiting.  Genitourinary: Negative for dysuria.  Musculoskeletal: Negative for myalgias.  Skin: Negative for rash.  Neurological: Negative for headaches.    Physical Exam Updated Vital Signs BP (!) 138/57    Pulse (!) 110    Temp 98.1 F (36.7 C) (Oral)    Resp (!) 31    Ht 6\' 1"  (1.854 m)    Wt (!) 203.5 kg    SpO2 96%    BMI 59.19 kg/m   Physical Exam Vitals and nursing note reviewed.  Constitutional:      Appearance: He is well-developed.  HENT:     Head: Normocephalic and atraumatic.  Eyes:     General:        Right eye: No discharge.        Left eye: No discharge.     Conjunctiva/sclera: Conjunctivae normal.  Cardiovascular:     Rate and Rhythm: Regular rhythm. Tachycardia present.     Heart sounds: Normal heart sounds.  Pulmonary:     Effort: Pulmonary effort is normal.     Breath sounds: Decreased breath sounds and wheezing (scattered expiratory) present.     Comments: Coughing during exam Abdominal:     Palpations: Abdomen is soft.     Tenderness: There is no abdominal tenderness.  Musculoskeletal:     Cervical back: Normal range of motion and neck supple.     Right lower leg: No tenderness. No edema.     Left lower leg: No tenderness. No edema.  Skin:    General: Skin is warm and dry.  Neurological:     Mental Status: He is alert.     ED Results / Procedures / Treatments   Labs (all labs ordered are listed, but only abnormal results are displayed) Labs Reviewed  CBC WITH DIFFERENTIAL/PLATELET - Abnormal; Notable for the following components:      Result Value   WBC 12.8 (*)    MCH 25.0 (*)    RDW 16.3 (*)    Neutro Abs 8.9 (*)    All other components within normal limits  BASIC METABOLIC PANEL - Abnormal; Notable for the following components:   Glucose, Bld 133 (*)    Calcium 8.7 (*)    All other components within normal limits  LACTIC ACID, PLASMA -  Abnormal; Notable for the following components:   Lactic Acid, Venous 2.0 (*)    All other components within normal limits  HEPATIC FUNCTION PANEL - Abnormal; Notable for the following components:   Total Protein 6.4 (*)    Albumin 3.4 (*)    All other components within normal limits  POCT I-STAT 7, (LYTES, BLD GAS, ICA,H+H) - Abnormal; Notable for the following components:   pO2, Arterial 127.0 (*)    All other components within normal limits  TROPONIN I (HIGH SENSITIVITY) - Abnormal; Notable for the following components:   Troponin I (High Sensitivity) 66 (*)    All other  components within normal limits  TROPONIN I (HIGH SENSITIVITY) - Abnormal; Notable for the following components:   Troponin I (High Sensitivity) 127 (*)    All other components within normal limits  CULTURE, BLOOD (ROUTINE X 2)  CULTURE, BLOOD (ROUTINE X 2)  URINE CULTURE  MAGNESIUM  APTT  PROTIME-INR  URINALYSIS, ROUTINE W REFLEX MICROSCOPIC  LACTIC ACID, PLASMA  PROCALCITONIN  I-STAT ARTERIAL BLOOD GAS, ED    EKG EKG Interpretation  Date/Time:  Thursday December 10 2019 17:33:24 EDT Ventricular Rate:  103 PR Interval:    QRS Duration: 93 QT Interval:  339 QTC Calculation: 444 R Axis:   105 Text Interpretation: Sinus tachycardia Borderline right axis deviation Borderline repolarization abnormality Confirmed by Davonna Belling [phone]) on 12/10/2019 7:14:19 PM   Radiology DG Chest Port 1 View  Result Date: 12/10/2019 CLINICAL DATA:  Shortness of breath. EXAM: PORTABLE CHEST 1 VIEW COMPARISON:  Single-view of the chest 09/19/2019. FINDINGS: There is extensive airspace disease in left mid and lower lung zones most consistent with pneumonia. The right lung appears clear. Cardiomegaly. No pneumothorax or pleural effusion. IMPRESSION: Airspace disease in the left mid and lower lung zones most consistent with pneumonia. Cardiomegaly. Electronically Signed   By: Inge Rise M.D.   On: 12/10/2019 16:46     Procedures Procedures (including critical care time)  Medications Ordered in ED Medications  albuterol (VENTOLIN HFA) 108 (90 Base) MCG/ACT inhaler 10 puff (10 puffs Inhalation Given 12/10/19 1648)  cefTRIAXone (ROCEPHIN) 2 g in sodium chloride 0.9 % 100 mL IVPB (0 g Intravenous Stopped 12/10/19 1825)  azithromycin (ZITHROMAX) 500 mg in sodium chloride 0.9 % 250 mL IVPB (500 mg Intravenous New Bag/Given 12/10/19 1741)  methylPREDNISolone sodium succinate (SOLU-MEDROL) 125 mg/2 mL injection 125 mg (125 mg Intravenous Given 12/10/19 1647)  ipratropium (ATROVENT HFA) inhaler 5 puff (5 puffs Inhalation Given 12/10/19 1649)  AeroChamber Plus Flo-Vu Large MISC 1 each (1 each Other Given 12/10/19 1700)    ED Course  I have reviewed the triage vital signs and the nursing notes.  Pertinent labs & imaging results that were available during my care of the patient were reviewed by me and considered in my medical decision making (see chart for details).  Patient seen and examined.  Patient with hypoxic respiratory failure.  When patient was switched off of the Venturi mask to 15 L nonrebreather, oxygen saturation quickly dropped into the low 80s before improving to the mid 90s.  Patient with mild increased effort of breathing.  Work-up initiated.  Will evaluate for pneumonia.  He will need admission.  Asthma treatment ordered including magnesium and solumedrol.  Vital signs reviewed and are as follows: BP (!) 138/57    Pulse (!) 110    Temp 98.1 F (36.7 C) (Oral)    Resp (!) 31    Ht 6\' 1"  (1.854 m)    Wt (!) 203.5 kg    SpO2 96%    BMI 59.19 kg/m   4:15 PM Informed by RN that respiratory therapy is apprehensive to do nebulized albuterol/atrovent. I changed mode of administration for this reason.   5:27 PM PT appears more sleepy. ABG ordered. PT states his breathing is feeling better after albuterol. Asked Dr. Alvino Chapel to see.   CXR with PNA. Sepsis work-up sent. Abx ordered. Lactate 2.0.    CRITICAL  CARE Performed by: Carlisle Cater PA-C Total critical care time: 45 minutes Critical care time was exclusive of separately billable procedures and treating other patients. Critical care  was necessary to treat or prevent imminent or life-threatening deterioration. Critical care was time spent personally by me on the following activities: development of treatment plan with patient and/or surrogate as well as nursing, discussions with consultants, evaluation of patient's response to treatment, examination of patient, obtaining history from patient or surrogate, ordering and performing treatments and interventions, ordering and review of laboratory studies, ordering and review of radiographic studies, pulse oximetry and re-evaluation of patient's condition.  7:11 PM Patient seen with Dr. Rubin Payor and RT. Blood gas looks okay. Patient unable to be weaned from NRB. Will give trial of bipap. Admission consult placed. Troponin 66 > 127.   7:45 PM Spoke with IMTS who will see.      MDM Rules/Calculators/A&P                      Pt with resp failure, hypoxia. Underlying OSA and restrictive lung disease. Appears to have PNA which has been recurrent. Trop 66 > 127. Suspect demand, will need trended.     Final Clinical Impression(s) / ED Diagnoses Final diagnoses:  Acute respiratory failure with hypoxia (HCC)  Multifocal pneumonia  Elevated troponin    Rx / DC Orders ED Discharge Orders    None       Renne Crigler, Cordelia Poche 12/10/19 1945    Benjiman Core, MD 12/10/19 2359

## 2019-12-10 NOTE — ED Notes (Signed)
MD and respiratory in with Pt.

## 2019-12-10 NOTE — ED Triage Notes (Signed)
Pt from home with ems for SOB and chest pain since this morning. Pt room air sats in the 70s, pt placed on 15L NRB with EMS with the saturations ranging from 86-90%. Pt currently 98% on 15L NRB in ED. Pt given duoneb en route. Pt a.o, other VSS.

## 2019-12-10 NOTE — H&P (Signed)
Date: 12/10/2019               Patient Name:  Benjamin Rosario MRN: [id number]  DOB: [birth year] Age / Sex: 19 y.o., male   PCP: Tracey Harries, MD         Medical Service: Internal Medicine Teaching Service         Attending Physician: Dr. Earl Lagos, MD    First Contact: Dr. Mcarthur Rossetti Pager: [phone]  Second Contact: Dr. Maryla Morrow Pager: [phone]       After Hours (After 5p/  First Contact Pager: [phone]  weekends / holidays): Second Contact Pager: [phone]   Chief Complaint: Chest pain and SOB  History of Present Illness: Benjamin Rosario is a 19 year old African American gentleman with PMHx significant for congenital pulmonary stenosis (has not followed woth cardiologist/pulmonologist since [employer]), prior diagnoses of pneumonia, recent COVID-19 infection 3 months ago, HTN, prediabetes, hypothyroidism, morbid obesity and asthma who presented to the hospital with shortness of breath and chest pain.  All of his sx began this morning when he developed a left frontal headache (3/10 in severity) with associated cough productive of yellow sputum. He then began experiencing non radiating left sided chest pain (7/10), which he describes as sore and stabbing in nature, as if someone was standing on his chest. This pain was accompanied by SOB and diaphoresis but no nausea. He did feel light headed but denies syncope. Pain worsened by exertion and improved with rest. He denies fevers, chills, palpitations, diarrhea, abdominal pain, myalgias, dysuria, LOC and changes in mood.   He is able to ambulate at home without any difficulties. He denies living a sedentary lifestyle. We were able to speak to his mom who confirmed his prior hospitalizations at [facility] for pneumonia. He was told to wear CPAP but has not been compliant. He does not take medicine for hypertension and is nonadherent with his medicines for hypothyroidism. The mom also reports that in the last 2 years, he has gained about 150 pounds.  He played football in highschool and that previously helped control his weight.   Meds:  Current Meds  Medication Sig  . albuterol (PROVENTIL HFA;VENTOLIN HFA) 108 (90 Base) MCG/ACT inhaler Inhale 2 puffs into the lungs every 6 (six) hours as needed for wheezing or shortness of breath.  . mometasone-formoterol (DULERA) 100-5 MCG/ACT AERO Inhale 2 puffs into the lungs 2 (two) times daily.   Allergies: Allergies as of 12/10/2019  . (No Known Allergies)   Past Medical History:  Diagnosis Date  . ADHD (attention deficit hyperactivity disorder)   . Asthma   . Chlamydia 11/25/2018  . Emphysema of lung (HCC) 11/25/2018  . Environmental allergies   . Hypothyroidism 11/25/2018   History reviewed. No pertinent surgical history.  Family History:  Family History  Problem Relation Age of Onset  . Hypertension Father   . Diabetes Father    Social History:  Social History   Tobacco Use  . Smoking status: Never Smoker  . Smokeless tobacco: Never Used  Substance Use Topics  . Alcohol use: No    Alcohol/week: 0.0 standard drinks  . Drug use: Yes    Types: Marijuana   -Lives with his girlfriend -Works as a Electrical engineer -Denies cigarette smoking -endorses occasional marijuana use -denies other drug use  Review of Systems: A complete ROS was negative except as per HPI.   Physical Exam: Blood pressure (!) 145/60, pulse 95, temperature 98.1 F (36.7 C), temperature source Oral,  resp. rate 18, height 6\' 1"  (1.854 m), weight (!) 203.5 kg, SpO2 93 %.  Physical Exam  Constitutional: He is oriented to person, place, and time.  Morbidly obese male in respiratory distress  Eyes: Right eye exhibits no discharge. Left eye exhibits no discharge.  Neck:  Buffalo hump, diffuse acanthosis nigricans   Cardiovascular: Regular rhythm. Exam reveals no friction rub.  No murmur heard. tachycardic  Pulmonary/Chest: He has wheezes. He has rales.  Tachypnic, on 15L nonrebreather  Abdominal: Bowel  sounds are normal. There is no abdominal tenderness.  Musculoskeletal:        General: No deformity or edema.     Cervical back: Normal range of motion.  Neurological: He is oriented to person, place, and time.  Slightly somnolent  Skin: Skin is warm and dry.  Psychiatric: Affect normal.  Nursing note and vitals reviewed.  Labs: Results for orders placed or performed during the hospital encounter of 12/10/19 (from the past 24 hour(s))  CBC with Differential/Platelet     Status: Abnormal   Collection Time: 12/10/19  3:57 PM  Result Value Ref Range   WBC 12.8 (H) 4.0 - 10.5 K/uL   RBC 5.71 4.22 - 5.81 MIL/uL   Hemoglobin 14.3 13.0 - 17.0 g/dL   HCT 02/09/20 67.3 - 41.9 %   MCV 81.8 80.0 - 100.0 fL   MCH 25.0 (L) 26.0 - 34.0 pg   MCHC 30.6 30.0 - 36.0 g/dL   RDW 37.9 (H) 02.4 - 09.7 %   Platelets 383 150 - 400 K/uL   nRBC 0.0 0.0 - 0.2 %   Neutrophils Relative % 69 %   Neutro Abs 8.9 (H) 1.7 - 7.7 K/uL   Lymphocytes Relative 22 %   Lymphs Abs 2.8 0.7 - 4.0 K/uL   Monocytes Relative 6 %   Monocytes Absolute 0.7 0.1 - 1.0 K/uL   Eosinophils Relative 2 %   Eosinophils Absolute 0.3 0.0 - 0.5 K/uL   Basophils Relative 1 %   Basophils Absolute 0.1 0.0 - 0.1 K/uL   Immature Granulocytes 0 %   Abs Immature Granulocytes 0.04 0.00 - 0.07 K/uL  Basic metabolic panel     Status: Abnormal   Collection Time: 12/10/19  3:57 PM  Result Value Ref Range   Sodium 137 135 - 145 mmol/L   Potassium 4.0 3.5 - 5.1 mmol/L   Chloride 101 98 - 111 mmol/L   CO2 24 22 - 32 mmol/L   Glucose, Bld 133 (H) 70 - 99 mg/dL   BUN 12 6 - 20 mg/dL   Creatinine, Ser 02/09/20 0.61 - 1.24 mg/dL   Calcium 8.7 (L) 8.9 - 10.3 mg/dL   GFR calc non Af Amer >60 >60 mL/min   GFR calc Af Amer >60 >60 mL/min   Anion gap 12 5 - 15  Magnesium     Status: None   Collection Time: 12/10/19  3:57 PM  Result Value Ref Range   Magnesium 1.7 1.7 - 2.4 mg/dL  Troponin I (High Sensitivity)     Status: Abnormal   Collection Time:  12/10/19  3:57 PM  Result Value Ref Range   Troponin I (High Sensitivity) 66 (H) <18 ng/L  Lactic acid, plasma     Status: Abnormal   Collection Time: 12/10/19  4:32 PM  Result Value Ref Range   Lactic Acid, Venous 2.0 (HH) 0.5 - 1.9 mmol/L  APTT     Status: None   Collection Time: 12/10/19  5:26 PM  Result Value Ref Range   aPTT 28 24 - 36 seconds  Protime-INR     Status: None   Collection Time: 12/10/19  5:26 PM  Result Value Ref Range   Prothrombin Time 13.0 11.4 - 15.2 seconds   INR 1.0 0.8 - 1.2  Hepatic function panel     Status: Abnormal   Collection Time: 12/10/19  5:26 PM  Result Value Ref Range   Total Protein 6.4 (L) 6.5 - 8.1 g/dL   Albumin 3.4 (L) 3.5 - 5.0 g/dL   AST 25 15 - 41 U/L   ALT 27 0 - 44 U/L   Alkaline Phosphatase 64 38 - 126 U/L   Total Bilirubin 0.5 0.3 - 1.2 mg/dL   Bilirubin, Direct <8.5 0.0 - 0.2 mg/dL   Indirect Bilirubin NOT CALCULATED 0.3 - 0.9 mg/dL  Troponin I (High Sensitivity)     Status: Abnormal   Collection Time: 12/10/19  5:40 PM  Result Value Ref Range   Troponin I (High Sensitivity) 127 (HH) <18 ng/L  I-STAT 7, (LYTES, BLD GAS, ICA, H+H)     Status: Abnormal   Collection Time: 12/10/19  6:21 PM  Result Value Ref Range   pH, Arterial 7.370 7.350 - 7.450   pCO2 arterial 46.0 32.0 - 48.0 mmHg   pO2, Arterial 127.0 (H) 83.0 - 108.0 mmHg   Bicarbonate 26.6 20.0 - 28.0 mmol/L   TCO2 28 22 - 32 mmol/L   O2 Saturation 99.0 %   Acid-Base Excess 1.0 0.0 - 2.0 mmol/L   Sodium 138 135 - 145 mmol/L   Potassium 3.9 3.5 - 5.1 mmol/L   Calcium, Ion 1.22 1.15 - 1.40 mmol/L   HCT 46.0 39.0 - 52.0 %   Hemoglobin 15.6 13.0 - 17.0 g/dL   Patient temperature HIDE    Sample type ARTERIAL   D-dimer, quantitative (not at [facility])     Status: Abnormal   Collection Time: 12/10/19  9:14 PM  Result Value Ref Range   D-Dimer, Quant 0.57 (H) 0.00 - 0.50 ug/mL-FEU   EKG: personally reviewed my interpretation is tachycardia with no acute ischemic  changes  CXR: personally reviewed my interpretation is consistent with pneumonia and cardiomegaly  DG Chest Port 1 View  Result Date: 12/10/2019 CLINICAL DATA:  Shortness of breath. EXAM: PORTABLE CHEST 1 VIEW COMPARISON:  Single-view of the chest 09/19/2019. FINDINGS: There is extensive airspace disease in left mid and lower lung zones most consistent with pneumonia. The right lung appears clear. Cardiomegaly. No pneumothorax or pleural effusion. IMPRESSION: Airspace disease in the left mid and lower lung zones most consistent with pneumonia. Cardiomegaly. Electronically Signed   By: Drusilla Kanner M.D.   On: 12/10/2019 16:46   Assessment:  Mr. Coldren is a 19 yo M with a PMHx of pulmonary stenosis , asthma, tobacco use, OSA, pulmonary hypertension, morbid obesity, hypertension, hypothyroidism, ADHD, major depression and hx of COVID-19 infection 10 weeks ago who presents with chest pain and shortness of breath requiring CPAP with elevated trops 66 --> 127, lactic acid of 2.0 --> 2.0, leukocytosis with signs and symptoms of pneumonia who is being admitted for acute respiratory failure with hypoxia and treatment of pneumonia.  Plan by Problem:  Acute hypoxic respiratory failure with hypoxia: Pneumonia: -pt with hx of pulmonary stenosis without frequent infections until the last year during which time he has been hospitalized 4x for pneumonia, most recently COVID pneumonia  -presenting today with acute onset infectious sx with associated chest pain and shortness  of breath -pt afebrile but has leukocytosis to 12.8, lactic acid of 2.0 and trops of 66 --> 127 -abg largely unremarkable -imaging consistent with pneumonia -pt initially required 15 L on nonrebreather but given he appeared tired he was switched to BIPAP; on nonrebreather during our exam so we could talk with him and he was satting 100% but did appear drowsy, although he was oriented x3 -his lungs had decreased breath sounds with some  basilar crackles and expiratory wheezes -received steroids, aerochamber plus flo-vu, atrovent and albuterol in the ED -patient likely with a community acquired pneumonia vs post viral syndrome, more likely a new pneumonia as his sx from covid had previously resolved -however, increasing episodes of pneumonia requiring hospitalization are concerning for an underlying immunodeficiency or some other chronic cause for increased infections -SOB is likely multifocal and worsened by current infection; he has pulmonary stenosis, asthma, OSA and given his weight he likely has a component of obesity hypoventilation syndrome; as he is nonadherent to his CPAP and he has a history of pulmonary hypertension, this may have worsened  -also concerned for PE given his concomitant chest pain, tachycardia, lightheadedness, and sudden onset of symptoms; he is not bed ridden but is not very active  Plan: -d-dimer -ct angio -procalcitonin -lactic acid -continue serial trops -HIV antibody  -daily cbc -azithromycin and ceftriaxone  -albuterol q4hrs -fu blood cxs, UA and urine cx  Chest Pain: -pt presenting with signs and sx of pneumonia with associated sx of chest pain concerning for cardiac chest pain given it is left sided, substernal, exertional and described as a stabbing/pressure like pain with associated diaphoresis and lightheadedness  -EKG without obvious ischemic changes -trops increasing, 66 --> 127 -given no acute ischemic changes on EKG this is not a STEMI but could either be a type I or II NSTEMI in setting of his acute hypoxic respiratory failure; more inclined to think a type II given his chest pain has resolved since his hypoxia has been treated -concern for PE as well given sudden onset SOB with tachycardia and lightheadedness -could be a post viral pericarditis or myocarditis in setting of recent COVID infection but sx don't exactly fit and EKG without diffuse ST elevations  Plan: -continue  serial trops -EKG with chest pain -D-dimer -CTA -ECHO  Hypertension: -hx of htn on amlodipine and hctz at home but nonadherent -bp elevated on admission, 155/137 most recently  Plan: -resumed home meds  Hypothyroidism: -resume home levothyroxine 75 mcg  Asthma: -q4hrs albuterol  OSA: -CPAP overnight -ECHO  Tobacco Use: -consider tobacco/marijuana cessation counseling in the AM  Principal Problem:   Acute respiratory failure with hypoxia ([place]) Active Problems:   Hypothyroidism   Community acquired pneumonia   Morbid obesity with BMI of 50.0-59.9, adult (HCC)   Elevated troponin  Dispo: Admit patient to Inpatient with expected length of stay greater than 2 midnights.  Signed: Al Decant, MD 12/10/2019, 9:45 PM  Pager:  [phone]

## 2019-12-11 ENCOUNTER — Inpatient Hospital Stay (HOSPITAL_COMMUNITY): Payer: No Typology Code available for payment source

## 2019-12-11 ENCOUNTER — Inpatient Hospital Stay: Payer: Self-pay

## 2019-12-11 DIAGNOSIS — R0602 Shortness of breath: Secondary | ICD-10-CM

## 2019-12-11 DIAGNOSIS — J9601 Acute respiratory failure with hypoxia: Secondary | ICD-10-CM

## 2019-12-11 LAB — POCT I-STAT 7, (LYTES, BLD GAS, ICA,H+H)
Acid-base deficit: 3 mmol/L — ABNORMAL HIGH (ref 0.0–2.0)
Acid-base deficit: 2 mmol/L (ref 0.0–2.0)
Acid-base deficit: 3 mmol/L — ABNORMAL HIGH (ref 0.0–2.0)
Acid-base deficit: 1 mmol/L (ref 0.0–2.0)
Bicarbonate: 28.8 mmol/L — ABNORMAL HIGH (ref 20.0–28.0)
Bicarbonate: 27 mmol/L (ref 20.0–28.0)
Bicarbonate: 25.5 mmol/L (ref 20.0–28.0)
Bicarbonate: 25.3 mmol/L (ref 20.0–28.0)
Calcium, Ion: 1.28 mmol/L (ref 1.15–1.40)
Calcium, Ion: 1.13 mmol/L — ABNORMAL LOW (ref 1.15–1.40)
Calcium, Ion: 1.16 mmol/L (ref 1.15–1.40)
Calcium, Ion: 1.15 mmol/L (ref 1.15–1.40)
HCT: 48 % (ref 39.0–52.0)
HCT: 46 % (ref 39.0–52.0)
HCT: 47 % (ref 39.0–52.0)
HCT: 46 % (ref 39.0–52.0)
Hemoglobin: 16.3 g/dL (ref 13.0–17.0)
Hemoglobin: 15.6 g/dL (ref 13.0–17.0)
Hemoglobin: 16 g/dL (ref 13.0–17.0)
Hemoglobin: 15.6 g/dL (ref 13.0–17.0)
O2 Saturation: 76 %
O2 Saturation: 63 %
O2 Saturation: 96 %
O2 Saturation: 96 %
Patient temperature: 98.8
Patient temperature: 98.8
Patient temperature: 99.3
Patient temperature: 99.3
Potassium: 5.1 mmol/L (ref 3.5–5.1)
Potassium: 7 mmol/L (ref 3.5–5.1)
Potassium: 7 mmol/L (ref 3.5–5.1)
Potassium: 6.3 mmol/L (ref 3.5–5.1)
Sodium: 135 mmol/L (ref 135–145)
Sodium: 133 mmol/L — ABNORMAL LOW (ref 135–145)
Sodium: 132 mmol/L — ABNORMAL LOW (ref 135–145)
Sodium: 133 mmol/L — ABNORMAL LOW (ref 135–145)
TCO2: 31 mmol/L (ref 22–32)
TCO2: 29 mmol/L (ref 22–32)
TCO2: 27 mmol/L (ref 22–32)
TCO2: 27 mmol/L (ref 22–32)
pCO2 arterial: 87.1 mmHg (ref 32.0–48.0)
pCO2 arterial: 65.1 mmHg (ref 32.0–48.0)
pCO2 arterial: 58.3 mmHg — ABNORMAL HIGH (ref 32.0–48.0)
pCO2 arterial: 48.9 mmHg — ABNORMAL HIGH (ref 32.0–48.0)
pH, Arterial: 7.128 — CL (ref 7.350–7.450)
pH, Arterial: 7.227 — ABNORMAL LOW (ref 7.350–7.450)
pH, Arterial: 7.251 — ABNORMAL LOW (ref 7.350–7.450)
pH, Arterial: 7.323 — ABNORMAL LOW (ref 7.350–7.450)
pO2, Arterial: 56 mmHg — ABNORMAL LOW (ref 83.0–108.0)
pO2, Arterial: 40 mmHg — CL (ref 83.0–108.0)
pO2, Arterial: 100 mmHg (ref 83.0–108.0)
pO2, Arterial: 92 mmHg (ref 83.0–108.0)

## 2019-12-11 LAB — BASIC METABOLIC PANEL
Anion gap: 12 (ref 5–15)
Anion gap: 13 (ref 5–15)
BUN: 12 mg/dL (ref 6–20)
BUN: 19 mg/dL (ref 6–20)
CO2: 25 mmol/L (ref 22–32)
CO2: 22 mmol/L (ref 22–32)
Calcium: 9.1 mg/dL (ref 8.9–10.3)
Calcium: 8.8 mg/dL — ABNORMAL LOW (ref 8.9–10.3)
Chloride: 98 mmol/L (ref 98–111)
Chloride: 101 mmol/L (ref 98–111)
Creatinine, Ser: 1.09 mg/dL (ref 0.61–1.24)
Creatinine, Ser: 1.47 mg/dL — ABNORMAL HIGH (ref 0.61–1.24)
GFR calc Af Amer: >60 mL/min (ref >60)
GFR calc Af Amer: >60 mL/min (ref >60)
GFR calc non Af Amer: >60 mL/min (ref >60)
GFR calc non Af Amer: >60 mL/min (ref >60)
Glucose, Bld: 252 mg/dL — ABNORMAL HIGH (ref 70–99)
Glucose, Bld: 205 mg/dL — ABNORMAL HIGH (ref 70–99)
Potassium: 5.5 mmol/L — ABNORMAL HIGH (ref 3.5–5.1)
Potassium: 5.1 mmol/L (ref 3.5–5.1)
Sodium: 135 mmol/L (ref 135–145)
Sodium: 136 mmol/L (ref 135–145)

## 2019-12-11 LAB — PROTEIN / CREATININE RATIO, URINE
Creatinine, Urine: 225.82 mg/dL
Protein Creatinine Ratio: 1.02 mg/mg{Cre} — ABNORMAL HIGH (ref 0.00–0.15)
Total Protein, Urine: 230 mg/dL

## 2019-12-11 LAB — CBC
HCT: 51.8 % (ref 39.0–52.0)
Hemoglobin: 15.7 g/dL (ref 13.0–17.0)
MCH: 24.9 pg — ABNORMAL LOW (ref 26.0–34.0)
MCHC: 30.3 g/dL (ref 30.0–36.0)
MCV: 82.2 fL (ref 80.0–100.0)
Platelets: 489 10*3/uL — ABNORMAL HIGH (ref 150–400)
RBC: 6.3 MIL/uL — ABNORMAL HIGH (ref 4.22–5.81)
RDW: 18 % — ABNORMAL HIGH (ref 11.5–15.5)
WBC: 20.1 10*3/uL — ABNORMAL HIGH (ref 4.0–10.5)
nRBC: 0 % (ref 0.0–0.2)

## 2019-12-11 LAB — LACTIC ACID, PLASMA: Lactic Acid, Venous: 2.8 mmol/L (ref 0.5–1.9)

## 2019-12-11 LAB — ECHOCARDIOGRAM COMPLETE
Height: 73 in
Weight: 7795.47 oz

## 2019-12-11 LAB — GLUCOSE, CAPILLARY
Glucose-Capillary: 224 mg/dL — ABNORMAL HIGH (ref 70–99)
Glucose-Capillary: 230 mg/dL — ABNORMAL HIGH (ref 70–99)
Glucose-Capillary: 207 mg/dL — ABNORMAL HIGH (ref 70–99)
Glucose-Capillary: 190 mg/dL — ABNORMAL HIGH (ref 70–99)
Glucose-Capillary: 184 mg/dL — ABNORMAL HIGH (ref 70–99)
Glucose-Capillary: 192 mg/dL — ABNORMAL HIGH (ref 70–99)

## 2019-12-11 LAB — TSH: TSH: 2.502 u[IU]/mL (ref 0.350–4.500)

## 2019-12-11 LAB — BRAIN NATRIURETIC PEPTIDE: B Natriuretic Peptide: 108.2 pg/mL — ABNORMAL HIGH (ref 0.0–100.0)

## 2019-12-11 LAB — HEMOGLOBIN A1C
Hgb A1c MFr Bld: 7.1 % — ABNORMAL HIGH (ref 4.8–5.6)
Mean Plasma Glucose: 157.07 mg/dL

## 2019-12-11 LAB — TRIGLYCERIDES: Triglycerides: 171 mg/dL — ABNORMAL HIGH (ref <150)

## 2019-12-11 LAB — STREP PNEUMONIAE URINARY ANTIGEN: Strep Pneumo Urinary Antigen: NEGATIVE

## 2019-12-11 MED ORDER — LEVOTHYROXINE SODIUM 25 MCG PO TABS
75.0000 ug | ORAL_TABLET | Freq: Every day | ORAL | Status: DC
  Administered 2019-12-12 – 2019-12-14 (×3): 75 ug
  Filled 2019-12-11 (×3): qty 3

## 2019-12-11 MED ORDER — CHLORHEXIDINE GLUCONATE CLOTH 2 % EX PADS
6.0000 | MEDICATED_PAD | Freq: Every day | CUTANEOUS | Status: DC
  Administered 2019-12-11 – 2019-12-16 (×5): 6 via TOPICAL

## 2019-12-11 MED ORDER — ORAL CARE MOUTH RINSE
15.0000 mL | OROMUCOSAL | Status: DC
  Administered 2019-12-11 – 2019-12-13 (×26): 15 mL via OROMUCOSAL

## 2019-12-11 MED ORDER — VANCOMYCIN VARIABLE DOSE PER UNSTABLE RENAL FUNCTION (PHARMACIST DOSING): Status: DC

## 2019-12-11 MED ORDER — VANCOMYCIN HCL 10 G IV SOLR
2500.0000 mg | Freq: Once | INTRAVENOUS | Status: AC
  Administered 2019-12-11: 2500 mg via INTRAVENOUS
  Filled 2019-12-11: qty 2500

## 2019-12-11 MED ORDER — HYDROCHLOROTHIAZIDE 10 MG/ML ORAL SUSPENSION
12.5000 mg | Freq: Every day | ORAL | Status: DC
  Administered 2019-12-11 – 2019-12-12 (×2): 13 mg
  Filled 2019-12-11 (×2): qty 1.88

## 2019-12-11 MED ORDER — BISACODYL 10 MG RE SUPP: 10.0000 mg | Freq: Every day | RECTAL | Status: DC | PRN

## 2019-12-11 MED ORDER — ALBUTEROL SULFATE (2.5 MG/3ML) 0.083% IN NEBU
2.5000 mg | INHALATION_SOLUTION | RESPIRATORY_TRACT | Status: DC
  Administered 2019-12-11: 2.5 mg via RESPIRATORY_TRACT
  Filled 2019-12-11: qty 3

## 2019-12-11 MED ORDER — PROPOFOL 1000 MG/100ML IV EMUL: 0.0000 ug/kg/min | INTRAVENOUS | Status: DC

## 2019-12-11 MED ORDER — SODIUM CHLORIDE 0.9 % IV SOLN
2.0000 g | Freq: Three times a day (TID) | INTRAVENOUS | Status: DC
  Administered 2019-12-11 – 2019-12-14 (×10): 2 g via INTRAVENOUS
  Filled 2019-12-11 (×10): qty 2

## 2019-12-11 MED ORDER — AMLODIPINE BESYLATE 10 MG PO TABS
10.0000 mg | ORAL_TABLET | Freq: Every day | ORAL | Status: DC
  Administered 2019-12-11 – 2019-12-14 (×4): 10 mg
  Filled 2019-12-11 (×3): qty 1

## 2019-12-11 MED ORDER — PANTOPRAZOLE SODIUM 40 MG IV SOLR
40.0000 mg | Freq: Every day | INTRAVENOUS | Status: DC
  Administered 2019-12-11 – 2019-12-12 (×2): 40 mg via INTRAVENOUS
  Filled 2019-12-11 (×2): qty 40

## 2019-12-11 MED ORDER — FENTANYL CITRATE (PF) 100 MCG/2ML IJ SOLN: 50.0000 ug | INTRAMUSCULAR | Status: DC | PRN

## 2019-12-11 MED ORDER — FENTANYL CITRATE (PF) 100 MCG/2ML IJ SOLN
INTRAMUSCULAR | Status: AC
  Administered 2019-12-11: 03:00:00 200 ug via INTRAVENOUS
  Filled 2019-12-11: qty 4

## 2019-12-11 MED ORDER — ROCURONIUM BROMIDE 50 MG/5ML IV SOLN
100.0000 mg | Freq: Once | INTRAVENOUS | Status: AC
  Administered 2019-12-11: 03:00:00 100 mg via INTRAVENOUS
  Filled 2019-12-11: qty 10

## 2019-12-11 MED ORDER — PROPOFOL 1000 MG/100ML IV EMUL
INTRAVENOUS | Status: AC
  Filled 2019-12-11: qty 100

## 2019-12-11 MED ORDER — ENOXAPARIN SODIUM 100 MG/ML ~~LOC~~ SOLN
100.0000 mg | SUBCUTANEOUS | Status: DC
  Administered 2019-12-11 – 2019-12-15 (×5): 100 mg via SUBCUTANEOUS
  Filled 2019-12-11 (×5): qty 1

## 2019-12-11 MED ORDER — IPRATROPIUM-ALBUTEROL 0.5-2.5 (3) MG/3ML IN SOLN
3.0000 mL | Freq: Four times a day (QID) | RESPIRATORY_TRACT | Status: DC
  Administered 2019-12-11 – 2019-12-14 (×13): 3 mL via RESPIRATORY_TRACT
  Filled 2019-12-11 (×14): qty 3

## 2019-12-11 MED ORDER — LACTATED RINGERS IV BOLUS
500.0000 mL | Freq: Once | INTRAVENOUS | Status: AC
  Administered 2019-12-11: 14:00:00 500 mL via INTRAVENOUS

## 2019-12-11 MED ORDER — DOCUSATE SODIUM 50 MG/5ML PO LIQD
100.0000 mg | Freq: Two times a day (BID) | ORAL | Status: DC | PRN
  Administered 2019-12-13: 100 mg
  Filled 2019-12-11: qty 10

## 2019-12-11 MED ORDER — SODIUM CHLORIDE 0.9% FLUSH: 10.0000 mL | INTRAVENOUS | Status: DC | PRN

## 2019-12-11 MED ORDER — MIDAZOLAM HCL 2 MG/2ML IJ SOLN
INTRAMUSCULAR | Status: AC
  Administered 2019-12-11: 2 mg via INTRAVENOUS
  Filled 2019-12-11: qty 2

## 2019-12-11 MED ORDER — MIDAZOLAM HCL 2 MG/2ML IJ SOLN
2.0000 mg | INTRAMUSCULAR | Status: AC | PRN
  Administered 2019-12-11 – 2019-12-12 (×3): 2 mg via INTRAVENOUS
  Filled 2019-12-11 (×3): qty 2

## 2019-12-11 MED ORDER — VANCOMYCIN HCL 1500 MG/300ML IV SOLN
1500.0000 mg | Freq: Three times a day (TID) | INTRAVENOUS | Status: DC
  Administered 2019-12-11: 1500 mg via INTRAVENOUS
  Filled 2019-12-11 (×2): qty 300

## 2019-12-11 MED ORDER — ETOMIDATE 2 MG/ML IV SOLN
20.0000 mg | Freq: Once | INTRAVENOUS | Status: AC
  Administered 2019-12-11: 03:00:00 20 mg via INTRAVENOUS

## 2019-12-11 MED ORDER — CHLORHEXIDINE GLUCONATE 0.12% ORAL RINSE (MEDLINE KIT)
15.0000 mL | Freq: Two times a day (BID) | OROMUCOSAL | Status: DC
  Administered 2019-12-11 – 2019-12-13 (×5): 15 mL via OROMUCOSAL

## 2019-12-11 MED ORDER — FENTANYL 2500MCG IN NS 250ML (10MCG/ML) PREMIX INFUSION
0.0000 ug/h | INTRAVENOUS | Status: DC
  Administered 2019-12-11: 250 ug/h via INTRAVENOUS
  Administered 2019-12-11: 50 ug/h via INTRAVENOUS
  Administered 2019-12-12: 200 ug/h via INTRAVENOUS
  Administered 2019-12-12: 250 ug/h via INTRAVENOUS
  Administered 2019-12-13: 200 ug/h via INTRAVENOUS
  Filled 2019-12-11 (×5): qty 250

## 2019-12-11 MED ORDER — FENTANYL CITRATE (PF) 100 MCG/2ML IJ SOLN
50.0000 ug | INTRAMUSCULAR | Status: DC | PRN
  Administered 2019-12-11: 50 ug via INTRAVENOUS
  Administered 2019-12-11 (×2): 100 ug via INTRAVENOUS
  Administered 2019-12-13: 200 ug via INTRAVENOUS
  Filled 2019-12-11: qty 4
  Filled 2019-12-11 (×2): qty 2

## 2019-12-11 MED ORDER — MIDAZOLAM HCL 2 MG/2ML IJ SOLN: 2.0000 mg | Freq: Once | INTRAMUSCULAR | Status: AC

## 2019-12-11 MED ORDER — IPRATROPIUM BROMIDE 0.02 % IN SOLN
0.5000 mg | Freq: Four times a day (QID) | RESPIRATORY_TRACT | Status: DC
  Administered 2019-12-11: 0.5 mg via RESPIRATORY_TRACT
  Filled 2019-12-11: qty 2.5

## 2019-12-11 MED ORDER — METHYLPREDNISOLONE SODIUM SUCC 125 MG IJ SOLR
60.0000 mg | Freq: Four times a day (QID) | INTRAMUSCULAR | Status: DC
  Administered 2019-12-11 – 2019-12-14 (×12): 60 mg via INTRAVENOUS
  Filled 2019-12-11 (×13): qty 2

## 2019-12-11 MED ORDER — INSULIN ASPART 100 UNIT/ML ~~LOC~~ SOLN
3.0000 [IU] | SUBCUTANEOUS | Status: DC
  Administered 2019-12-11 (×3): 6 [IU] via SUBCUTANEOUS
  Administered 2019-12-11 – 2019-12-12 (×2): 9 [IU] via SUBCUTANEOUS
  Administered 2019-12-12 (×2): 6 [IU] via SUBCUTANEOUS
  Administered 2019-12-12 (×2): 9 [IU] via SUBCUTANEOUS
  Administered 2019-12-12: 6 [IU] via SUBCUTANEOUS
  Administered 2019-12-13 (×2): 9 [IU] via SUBCUTANEOUS
  Administered 2019-12-13 – 2019-12-14 (×4): 6 [IU] via SUBCUTANEOUS
  Administered 2019-12-14 (×2): 3 [IU] via SUBCUTANEOUS
  Administered 2019-12-14: 04:00:00 6 [IU] via SUBCUTANEOUS

## 2019-12-11 MED ORDER — FUROSEMIDE 10 MG/ML IJ SOLN
60.0000 mg | Freq: Once | INTRAMUSCULAR | Status: AC
  Administered 2019-12-11: 60 mg via INTRAVENOUS
  Filled 2019-12-11: qty 6

## 2019-12-11 MED ORDER — SODIUM CHLORIDE 0.9% FLUSH
10.0000 mL | Freq: Two times a day (BID) | INTRAVENOUS | Status: DC
  Administered 2019-12-12 – 2019-12-15 (×7): 10 mL
  Administered 2019-12-16: 20 mL

## 2019-12-11 MED ORDER — MIDAZOLAM HCL 2 MG/2ML IJ SOLN
2.0000 mg | INTRAMUSCULAR | Status: DC | PRN
  Administered 2019-12-12: 2 mg via INTRAVENOUS
  Filled 2019-12-11: qty 2

## 2019-12-11 MED ORDER — PROPOFOL 1000 MG/100ML IV EMUL
0.0000 ug/kg/min | INTRAVENOUS | Status: DC
  Administered 2019-12-11: 25 ug/kg/min via INTRAVENOUS
  Administered 2019-12-11: 50 ug/kg/min via INTRAVENOUS
  Administered 2019-12-11: 30 ug/kg/min via INTRAVENOUS
  Administered 2019-12-11: 50 ug/kg/min via INTRAVENOUS
  Administered 2019-12-11: 30 ug/kg/min via INTRAVENOUS
  Administered 2019-12-11: 50 ug/kg/min via INTRAVENOUS
  Administered 2019-12-11: 30 ug/kg/min via INTRAVENOUS
  Administered 2019-12-12: 25 ug/kg/min via INTRAVENOUS
  Administered 2019-12-12: 30 ug/kg/min via INTRAVENOUS
  Filled 2019-12-11 (×2): qty 100
  Filled 2019-12-11: qty 200
  Filled 2019-12-11 (×2): qty 100
  Filled 2019-12-11: qty 200
  Filled 2019-12-11 (×2): qty 100

## 2019-12-11 MED ORDER — FENTANYL CITRATE (PF) 100 MCG/2ML IJ SOLN: 200.0000 ug | Freq: Once | INTRAMUSCULAR | Status: AC

## 2019-12-11 NOTE — Progress Notes (Signed)
Pt.woke up & removed his BIPAP to void in urinal .& he got SOB after. Dr.AJ came to see pt.& assisted pt.to put back  BIPAP .& Dr.AJ said PCCMD is coming to see pt.V/S taken T=97.9 axillary ;B/P=161/97;R=34;O2 sat=97 % on BIPAP.

## 2019-12-11 NOTE — Progress Notes (Signed)
Spoke with family at bedside. Updated about plan.   Levora Dredge, MD  [facility] PGY3 Pager: [phone]

## 2019-12-11 NOTE — Progress Notes (Signed)
Inpatient Diabetes Program Recommendations  AACE/ADA: New Consensus Statement on Inpatient Glycemic Control (2015)  Target Ranges:  Prepandial:   less than 140 mg/dL      Peak postprandial:   less than 180 mg/dL (1-2 hours)      Critically ill patients:  140 - 180 mg/dL   Lab Results  Component Value Date   GLUCAP 207 (H) 12/11/2019   HGBA1C 7.1 (H) 12/11/2019    Review of Glycemic Control Results for Benjamin Rosario, Benjamin Rosario (MRN [id number]) as of 12/11/2019 11:24  Ref. Range 12/11/2019 03:12 12/11/2019 07:20 12/11/2019 08:31  Glucose-Capillary Latest Ref Range: 70 - 99 mg/dL 844 (H) 171 (H) 278 (H)   Diabetes history: None- however A1C=7.1% Outpatient Diabetes medications:  None Current orders for Inpatient glycemic control:  Novolog 3-6-9 q 4 hours Inpatient Diabetes Program Recommendations:   Note ICU order set started.  A1C is elevated.  Will need to follow up when appropriate.   Thanks,  Beryl Meager, RN, BC-ADM Inpatient Diabetes Coordinator Pager [phone] (8a-5p)

## 2019-12-11 NOTE — Consult Note (Addendum)
..   NAME:  Benjamin Rosario, MRN:  [id number], DOB:  01/05/2001, LOS: 1 ADMISSION DATE:  12/10/2019, CONSULTATION DATE:  12/11/2019 REFERRING MD:  Heide SparkNarendra MD (IM[facility]), CHIEF COMPLAINT:  SOB   Brief History   19 yr old M w/ PMHx HTN, pre diabetes, Hypothyroidism, OSA, OHS, pulmonary stenosis, unilateral paraseptal emphysema and COVID Pneumonia in 09/2019 s/p convalescent plasma presents on 04/01 with SOB w/ productive cough and was started on NIPPV after needing 15L NRB. PCCM called to evaluate due to increased work of breathing on NIPPV. Endotracheally intubated for acute on chronic respiratory failure with hypoxia and hypercarbia  History of present illness   ( History obtained from review of the EMR and acct of other providers including brief conversation with patient prior to intubation)  19 yr old M w/ PMHx HTN, pre-diabetes, Hypothyroidism (on Levothyroxine 75 mcg), OSA ( ? Compliance with CPAP), OHS, pulmonary stenosis, unilateral paraseptal emphysema and COVID Pneumonia in 09/2019 s/p convalescent plasma presents on 04/01 with SOB w/ productive cough.  Per documentation by [facility] pt c/o left frontal headache 3/10 and non radiating left sided chest pain 7/10 when his symptoms started on 04/01 in AM.  He continued to have progressive worsening of his dyspnea w/ diaphoresis but no nausea. He had lightheadedness but no syncopal event.  His chest pain worsened with any exertion and improved with rest.    On evaluation in ED pt was started on 15L NRB and was noted to have tachypnea.  He was transitioned to NIPPV and admitted by Internal Medicine.  PCCM called to evaluate  When pt was noted to have increased work of breathing on NIPPV.  On my evaluation pt sitting at edge of bed with increased work of breathing and occasional nodding. Rousable but looked weak and was visibly uncomfortable.  NIPPV mask fit was optimal with minimal leak. His repeat COVID test was negative.     Decision made to  endotracheally intubate for acute on chronic respiratory failure with hypoxia and hypercarbia.   Past Medical History  .Marland Kitchen. Active Ambulatory Problems    Diagnosis Date Noted  . Mixed hyperlipidemia 02/26/2011  . Obesity 02/26/2011  . Pre-diabetes 02/26/2011  . Other specified acquired hypothyroidism 02/26/2011  . MDD (major depressive disorder), single episode, moderate (HCC) 06/06/2013  . ADHD (attention deficit hyperactivity disorder), combined type 06/07/2013  . Concussion with no loss of consciousness 07/27/2015  . Hypothyroidism 11/25/2018  . Chlamydia 11/25/2018  . SOB (shortness of breath) 11/25/2018  . Chest pain 11/25/2018  . Emphysema lung (HCC) 11/25/2018  . Acute respiratory failure with hypoxemia (HCC) 06/07/2019  . Community acquired pneumonia 06/07/2019  . Acute respiratory failure with hypoxia (HCC) 06/10/2019  . Hypokalemia 06/10/2019  . Morbid obesity with BMI of 50.0-59.9, adult (HCC) 06/10/2019  . Chronic respiratory failure with hypoxia (HCC)   . Pneumonia due to COVID-19 virus 09/19/2019   Resolved Ambulatory Problems    Diagnosis Date Noted  . GAD (generalized anxiety disorder) 06/09/2013   Past Medical History:  Diagnosis Date  . ADHD (attention deficit hyperactivity disorder)   . Asthma   . Emphysema of lung (HCC) 11/25/2018  . Environmental allergies      Significant Hospital Events   04/02 increasing respiratory difficulty--> initiation of NIPPV--> increasing requirments--> failure of NIPPV  04/02>>Endotracheal intubation  Consults:  04/02>>PCCM  Procedures:  04/02>>Endotracheal Intubation  Significant Diagnostic Tests:  CTA chest on 12/10/2019 ( compared to previous imaging on 09/19/2019) FINDINGS: Cardiovascular: Thoracic aorta shows a normal branching  pattern. No aneurysmal dilatation or dissection is seen. No significant cardiac enlargement is noted. 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 The opacification of the pulmonary artery is somewhat limited secondary to the patient's body habitus and contrast timing bolus despite 2 attempts for optimum imaging. This is similar to that seen in January of 2021. No large central pulmonary embolus is seen. No pericardial effusion is noted.  Mediastinum/Nodes: Thoracic inlet is within normal limits. Scattered small mediastinal lymph nodes are noted. These are likely reactive in nature. The esophagus as visualized is within normal limits.  Lungs/Pleura: Right lung demonstrates significant emphysematous changes and volume loss consistent with the absent pulmonary artery. These changes are stable from the prior exam. The left lung is well aerated and demonstrates patchy infiltrates throughout the left lung. This is consistent with that seen on recent chest x-ray consistent with multifocal pneumonia. Additionally there may be some sequelae from the patient's known COVID-19 infection. No sizable effusion is noted. Focal pleural thickening is again noted in the left apex laterally stable dating back to 2020 and likely of a benign etiology. Upper Abdomen: Visualized upper abdomen shows no acute abnormality. Musculoskeletal: Bony structures are within normal limits.  LA 2.0 D- dimer 0.57 ABG 7.37/46/127/26 Trop peaking at 127 from 66 Micro Data:  09/2019>> + SARSCOV2 12/10/2019>> negative SARSCOV2  12/10/2019>> negative for Influenza A and B 04/01>>Blood cx x 2>> sent 04/02>>Tracheal Aspirate >> sent  Antimicrobials:  04/01>> Ceftriaxone 04/01>> Azithromycin Post intubation increased coverage: 04/02>> Vancomycin 04/02>> Cefepime   Objective   Blood pressure (!) 98/58, pulse 99, temperature 98.8 F (37.1 C), temperature source Axillary, resp. rate (!) 24, height 6\' 1"  (1.854 m), weight (!) 221 kg, SpO2 92 %.    Vent Mode: PCV FiO2 (%):  [40 %-100 %] 100 % Set Rate:  [16 bmp-24 bmp] 24 bmp PEEP:  [10  cmH20] 10 cmH20   Intake/Output Summary (Last 24 hours) at 12/11/2019 0559 Last data filed at 12/11/2019 0200 Gross per 24 hour  Intake 570 ml  Output 1300 ml  Net -730 ml   Filed Weights   12/10/19 1548 12/10/19 2335 12/11/19 0400  Weight: (!) 203.5 kg (!) 210.2 kg (!) 221 kg    Examination: General: morbidly obese  HENT: normocephalic atraumatic, ETT and OGT in oropharynx Lungs: decreased breath sounds on righ tw/  Wheezing.  + rhonci  left hemithorax Cardiovascular: S1 and S2  Abdomen: obese soft non tender + BS Extremities: non pitting lower ext edema Neuro: no focal deficits Skin: warm dry and grossly intact, dark velvet skin noted in folds and around face most likely acanthosis  Assessment & Plan:  1. Acute on chronic respiratory failure with combination of hypoxia and hypercarbia In pt w/ Obesity Hypoventilation and Obstructive sleep apnea Endotracheally intubated and placed on MV support Difficult airway, size 8 ETT placed More synchronous in pressure control Was admitted in 05/2019 with a Left lower lobe pneumonia Plan: Continue on MV Pressure control full support no wean at this time Goal 6- 7cc/kg. Will increase RR to increase minute ventilation to compensate and improve CO2 retention to normalize ph. 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 Acute Encephalopathy Pt AAO x3 prior to intubation  but now actively sedated for vent synchrony and compliance with interventions.  Plan: Pain and sedation protocol Propofol gtt  Titrate to keep RASS -1 to -2  Fentanyl gtt for analgesia Consider PO Gabapentin and Oxycodone to reduce IV sedation needs  3. H/o Paraseptal Emphysema on right Lucency of right hemithorax is most likely secondary to  h/o congenital Pulmonary stenosis Decreased blood flow may contribute to etiology  In addition pt has a h/o smoking, heavy marijuana use per outpatient pulm visit and previous evaluation. No PFTs were completed.  Pt was [facility]; per their assessment "pulmonary valve with a subtle structural abnormality and there is some mild residual turbulence across this valve"  There was no flow obstruction and only a mild gradient at the time therefore he did not require surgical intervention.  Plan: Bronchodilators Q 6 Consider continuing IV steroids (Solumedrol 60 mg IV Q 6) taper when wheezing improves  4. Type II NSTEMI C/o of chest discomfort while on NIPPV Troponin 66->127  No EKG changes Reviewed previous EKGs he has a prolonged QT with early repol (non sp ST changes) Most likely this a Type II NSTEMI in setting of his acute hypoxia.   5. H/o Pre-diabetes BG >200mg /dl on metabolic panel Acanthosis on exam Glucosuria in UA Plan: Now Diabetes  Check HgbA1c Start on ISS keep BG 140-180 mg/dl while inpatient  6. H/o Hypothyroidism ? Compliance with Levothyroxine 75 mcg Plan: Check TSH Restart home dose up-titrate if needed  Best practice:  Diet: has OGT start on TF Pain/Anxiety/Delirium protocol (if indicated): yes Fentanyl gtt Sedation: Rass goal -1 to -2 per protocol VAP protocol (if indicated): yes DVT prophylaxis: SCDs and Lovenox GI prophylaxis: PPI  Glucose control: ISS pt has BG 252 and Glucosuria on UA Mobility: bedrest Q 2 Code Status:  Full Family  Communication: Mom is NOK Richrd Prime( she is a nurse) [phone] Disposition: Transferred to ICU for endotracheal intubation  Labs   CBC: Recent Labs  Lab 12/10/19 1557 12/10/19 1821 12/11/19 0349 12/11/19 0446  WBC 12.8*  --  20.1*  --   NEUTROABS 8.9*  --   --   --   HGB 14.3 15.6 15.7 16.3  HCT 46.7 46.0 51.8 48.0  MCV 81.8  --  82.2  --   PLT 383  --  489*  --     Basic Metabolic Panel: Recent Labs  Lab 12/10/19 1557 12/10/19 1821 12/11/19 0349 12/11/19 0446  NA 137 138 135 135  K 4.0 3.9 5.5* 5.1  CL 101  --  98  --   CO2 24  --  25  --   GLUCOSE 133*  --  252*  --   BUN 12  --  12  --   CREATININE 0.95  --  1.09  --   CALCIUM 8.7*  --  9.1  --   MG 1.7  --   --   --    GFR: Estimated Creatinine Clearance: 208.4 mL/min (by C-G formula based on SCr of 1.09 mg/dL). Recent Labs  Lab 12/10/19 1557 12/10/19 1632  12/10/19 2114 12/11/19 0349  PROCALCITON  --   --  <0.10  --   WBC 12.8*  --   --  20.1*  LATICACIDVEN  --  2.0* 2.3* 2.8*    Liver Function Tests: Recent Labs  Lab 12/10/19 1726  AST 25  ALT 27  ALKPHOS 64  BILITOT 0.5  PROT 6.4*  ALBUMIN 3.4*   No results for input(s): LIPASE, AMYLASE in the last 168 hours. No results for input(s): AMMONIA in the last 168 hours.  ABG    Component Value Date/Time   PHART 7.128 (LL) 12/11/2019 0446   PCO2ART 87.1 (HH) 12/11/2019 0446   PO2ART 56.0 (L) 12/11/2019 0446   HCO3 28.8 (H) 12/11/2019 0446   TCO2 31 12/11/2019 0446   ACIDBASEDEF 3.0 (H) 12/11/2019 0446   O2SAT 76.0 12/11/2019 0446     Coagulation Profile: Recent Labs  Lab 12/10/19 1726  INR 1.0    Cardiac Enzymes: No results for input(s): CKTOTAL, CKMB, CKMBINDEX, TROPONINI in the last 168 hours.  HbA1C: Hgb A1c MFr Bld  Date/Time Value Ref Range Status  06/08/2019 01:50 PM 6.5 (H) 4.8 - 5.6 % Final    Comment:    (NOTE) Pre diabetes:          5.7%-6.4% Diabetes:              >6.4% Glycemic control for   <7.0% adults  with diabetes   11/25/2018 08:02 AM 6.3 (H) 4.8 - 5.6 % Final    Comment:    (NOTE) Pre diabetes:          5.7%-6.4% Diabetes:              >6.4% Glycemic control for   <7.0% adults with diabetes     CBG: Recent Labs  Lab 12/11/19 0312  GLUCAP 224*    Review of Systems:   Marland KitchenMarland KitchenReview of Systems  Constitutional: Positive for diaphoresis and malaise/fatigue.  HENT: Negative.   Eyes: Negative.   Respiratory: Positive for cough, shortness of breath and wheezing.   Cardiovascular: Positive for chest pain and leg swelling.  Gastrointestinal: Negative.   Genitourinary: Negative.   Musculoskeletal: Negative.   Neurological: Positive for dizziness and headaches. Negative for seizures and loss of consciousness.  Endo/Heme/Allergies: Negative.   Psychiatric/Behavioral: The patient is nervous/anxious.    ROS completed prior to intubation  Past Medical History  He,  has a past medical history of ADHD (attention deficit hyperactivity disorder), Asthma, Chlamydia (11/25/2018), Emphysema of lung (HCC) (11/25/2018), Environmental allergies, and Hypothyroidism (11/25/2018).   Surgical History   History reviewed. No pertinent surgical history.   Social History   reports that he has never smoked. He has never used smokeless tobacco. He reports current drug use. Drug: Marijuana. He reports that he does not drink alcohol.   Family History   His family history includes Diabetes in his father; Hypertension in his father.   Allergies No Known Allergies   Home Medications  Prior to Admission medications   Medication Sig Start Date End Date Taking? Authorizing Provider  albuterol (PROVENTIL HFA;VENTOLIN HFA) 108 (90 Base) MCG/ACT inhaler Inhale 2 puffs into the lungs every 6 (six) hours as needed for wheezing or shortness of breath. 11/28/18  Yes Purohit, Salli Quarry, MD  mometasone-formoterol (DULERA) 100-5 MCG/ACT AERO Inhale 2 puffs into the lungs 2 (two) times daily. 06/09/19  Yes Rai, Ripudeep K,  MD  amLODipine (NORVASC) 10 MG tablet Take 1 tablet (10 mg total) by mouth daily. 06/15/19  Almon Hercules, MD  hydrochlorothiazide (MICROZIDE) 12.5 MG capsule Take 1 capsule (12.5 mg total) by mouth daily. Patient not taking: Reported on 12/10/2019 06/15/19   Almon Hercules, MD  levothyroxine (SYNTHROID) 75 MCG tablet Take 1 tablet (75 mcg total) by mouth daily before breakfast. Patient not taking: Reported on 12/10/2019 06/09/19   Rai, Delene Ruffini, MD  predniSONE (STERAPRED UNI-PAK 21 TAB) 10 MG (21) TBPK tablet Per dose pack Patient not taking: Reported on 12/10/2019 09/20/19   Almon Hercules, MD   STAFF NOTE  I, Dr Newell Coral have personally reviewed patient's available data, including medical history, events of note, physical examination and test results as part of my evaluation. I have discussed with NP Lodema Hong and other care providers such as pharmacist, RN and [facility] physician Dr Benjamin Stain. The patient is critically ill with multiple organ systems failure and requires high complexity decision making for assessment and support, frequent evaluation and titration of therapies, application of advanced monitoring technologies and extensive interpretation of multiple databases.   Critical Care Time devoted to patient care services described in this note is 65 Minutes. This time reflects time of care of this signee Dr Newell Coral. This critical care time does not reflect procedure time, or teaching time or supervisory timebut could involve care discussion time   Dr. Newell Coral Pulmonary Critical Care Medicine  12/11/2019 6:00 AM    Critical care time: 65 mins

## 2019-12-11 NOTE — Progress Notes (Signed)
Patient admitted to the unit with Acute on chronic hypercarbic respiratory failure. ABG 7.25/58/100. Transitioned from pressure support to [facility] with TV of 8cc/kg and RR of 27. Will recheck ABG in 30 minutes and adjust accordingly. Currently sedated with fentanyl and propofol. BP is not tolerating propofol. Will increase fentanyl and wean off propofol. PRN versed added.   Levora Dredge, MD  [facility] PGY3  Pager: [phone]

## 2019-12-11 NOTE — Progress Notes (Signed)
RRT Cala Bradford came to place pt.on BIPAP & noted RR =30'2-40's. & she said only if he is sleeping but O2 sat is 99%..Will continue to monitor pt.

## 2019-12-11 NOTE — Progress Notes (Signed)
Dr.Scatliffe came & assessed pt.& ordered to transfer pt.to ICU .

## 2019-12-11 NOTE — Progress Notes (Signed)
eLink Physician-Brief Progress Note Patient Name: Benjamin Rosario DOB: 04/04/2001 MRN: [id number]   Date of Service  12/11/2019  HPI/Events of Note  Patient extremely agitated and hypertensive on Fentanyl 200. Propofol resumed and patient is now adequately sedated. Propofol discontinued due to patient weight. Triglycerides 171  eICU Interventions  I was informed that there's plan of possible extubation in the am. We can keep him on propofol overnight and discontinue in am. If prolonged sedation will be needed will need alternative sedative     Intervention Category Minor Interventions: Agitation / anxiety - evaluation and management  Darl Pikes 12/11/2019, 8:53 PM

## 2019-12-11 NOTE — Progress Notes (Signed)
Critical ABG results called to box. Awaiting orders.  

## 2019-12-11 NOTE — Progress Notes (Signed)
Transferred pt.to 37M-02 in bed with O2 @ 100 %NRM accompanied by resp.therapist &  Rapid response nurse .Report given to ICU nurse.

## 2019-12-11 NOTE — Progress Notes (Signed)
Pharmacy Antibiotic Note  Benjamin Rosario is a 19 y.o. male admitted on 12/10/2019 with pneumonia.  Pharmacy has been consulted for vancomycin/cefepime dosing. SCr 0.95 on admit.  Plan: Cefepime 2g IV q8h Vancomycin 2500mg  IV x1; then 1500 mg IV Q 8 hrs Monitor clinical progress, c/s, renal function F/u de-escalation plan/LOT, vancomycin levels at steady state   Height: 6\' 1"  (185.4 cm) Weight: (!) 210.2 kg (463 lb 6.4 oz) IBW/kg (Calculated) : 79.9  Temp (24hrs), Avg:98.2 F (36.8 C), Min:97.9 F (36.6 C), Max:98.8 F (37.1 C)  Recent Labs  Lab 12/10/19 1557 12/10/19 1632 12/10/19 2114  WBC 12.8*  --   --   CREATININE 0.95  --   --   LATICACIDVEN  --  2.0* 2.3*    Estimated Creatinine Clearance: 231.6 mL/min (by C-G formula based on SCr of 0.95 mg/dL).    No Known Allergies  Antimicrobials this admission: 4/2 vancomycin >>  4/2 cefepime >>   Dose adjustments this admission:   Microbiology results:   02/09/20, PharmD, BCPS Please check AMION for all [facility] Pharm[facility] contact numbers Clinical Pharmacist 12/11/2019 3:47 AM

## 2019-12-11 NOTE — Progress Notes (Signed)
Entered pt's room at 1905 due to vent alarm for dysynchrony and patient agitation. Pt was fully awake with VS 190's/100s, RR 45, HR 140, SpO2 85%. Gave bolus of fentanyl which did not improve pt status, initiated propofol and titrated up until pt VS improved and pt no longer agitated. Pt VSS now, calmly resting. Family at bedside updated with plan of care and goals for this evening to include sufficient rest to allow wean tomorrow.

## 2019-12-11 NOTE — Progress Notes (Signed)
Sputum sample obtained and sent to lab.  

## 2019-12-11 NOTE — Progress Notes (Signed)
eLink Physician-Brief Progress Note Patient Name: Benjamin Rosario DOB: 07-13-2001 MRN: [id number]   Date of Service  12/11/2019  HPI/Events of Note  39M with hx of OHS, asthma, HTN and recurrent pneumonia who was admitted with COVID recently but recovered and discharged, who is transferred to ICU with acute hypoxemic and hypercarbic respiratory failure. CTA ruled out PE but did show chronic changes in R lung w/ volume loss, scarring, and emphysematous change. Also with new multifocal infiltrate throughout left lung c/w pneumonia. COVID test negative this admission.   eICU Interventions  # NEURO: - Prop/fent drips for sedation  # RESP: Acute on chronic hypoxemic and hypercarbic respiratory failure secondary to likely pneumonia. - Continue MV. - Treat for HCAP as below. - Increased RR from 16 to 24 after ABG of 7.13/87 post-intubation. Repeat ABG in 2-3 hours after these changes.  # ID: - Vanc/cefepime empirically for HCAP. - F/u trach aspirate culture.  DVT PPX: Lovenox 40mg  [place] daily. CODE STATUS: Full Code.     Intervention Category Evaluation Type: New Patient Evaluation  12/11/2019, 4:59 AM

## 2019-12-11 NOTE — Progress Notes (Signed)
Critical ABG values given to Dr. Caron Presume.  Vent settings changed to PRVC, VT 8ccs, RR 28, peep 10, 80%.  ABG will follow in 1 hour.

## 2019-12-11 NOTE — Progress Notes (Signed)
  Echocardiogram 2D Echocardiogram has been performed.  Benjamin Rosario 12/11/2019, 8:38 AM

## 2019-12-11 NOTE — Progress Notes (Addendum)
Asked to help facilitate transfer of pt to ICU for intubation. Pt transferred to 2M02 with RN, RR RN, and RT.

## 2019-12-11 NOTE — Procedures (Addendum)
Intubation Procedure Note Benjamin Rosario [id number] 07-29-2001  Procedure: Intubation Indications: Respiratory insufficiency and failure on NIPPV Called to evaluated pt on 6 E due to respiratory insufficiency Upon evaluation sitting at edge of bed on Bipap  Looks visibly uncomfortable States he has some chest pain   Procedure Details Consent: Risks of procedure as well as the alternatives and risks of each were explained to the (patient/caregiver).  Consent for procedure obtained. Time Out: Verified patient identification, verified procedure, site/side was marked, verified correct patient position, special equipment/implants available, medications/allergies/relevent history reviewed, required imaging and test results available.  Performed  Maximum sterile technique was used including cap, gloves, hand hygiene and mask.  MAC and 4 RSI: Fentanyl 200 mg Versed 2 mg, Etomidate 20 mg and  NMB: Rocuronium 100 mg upon visualization of the cords   Evaluation Hemodynamic Status: BP stable throughout; O2 sats: currently acceptable Patient's Current Condition: stable Complications: No apparent complications Patient did tolerate procedure well. Chest X-ray ordered to verify placement.  CXR: tube position acceptable   Benjamin Rosario 12/11/2019

## 2019-12-11 NOTE — Progress Notes (Signed)
Initial Nutrition Assessment  DOCUMENTATION CODES:   Morbid obesity  INTERVENTION:   Recommend begin TF via OGT:   Vital High Protein at 60 ml/h (1440 ml per day)   Pro-stat 30 ml QID   Provides 1840 kcal, 186 gm protein, 1204 ml free water daily  NUTRITION DIAGNOSIS:   Inadequate oral intake related to inability to eat as evidenced by NPO status.  GOAL:   Provide needs based on ASPEN/SCCM guidelines  MONITOR:   Vent status, Labs, I & O's  REASON FOR ASSESSMENT:   Ventilator    ASSESSMENT:   19 yo male admitted with SOB and chest pain. Required intubation on the early morning of 4/2. PMH includes congenital pulmonary stenosis, COVID-19, ADHD, hypothyroidism, emphysema, asthma.   Patient is currently intubated on ventilator support MV: 17.1 L/min Temp (24hrs), Avg:98.6 F (37 C), Min:97.9 F (36.6 C), Max:99.3 F (37.4 C)  Propofol being stopped.   Labs reviewed. Sodium 133 (L), potassium 6.3 (H), trig 171 (H), A1C 7.1 (H) CBG's: 230-207  Medications reviewed and include novolog, solumedrol.  Weight up from 203.5 kg on 09/20/19.  NUTRITION - FOCUSED PHYSICAL EXAM:  unable to complete  Diet Order:   Diet Order            Diet NPO time specified  Diet effective now              EDUCATION NEEDS:   Not appropriate for education at this time  Skin:  Skin Assessment: Reviewed RN Assessment  Last BM:  no BM documented  Height:   Ht Readings from Last 1 Encounters:  12/11/19 6\' 1"  (1.854 m)    Weight:   Wt Readings from Last 1 Encounters:  12/11/19 (!) 221 kg    Ideal Body Weight:  83.6 kg  BMI:  Body mass index is 64.28 kg/m.  Estimated Nutritional Needs:   Kcal:  1840-2090  Protein:  167-209 gm  Fluid:  >/= 2 L    02/10/20, RD, LDN, CNSC Please refer to Amion for contact information.

## 2019-12-12 ENCOUNTER — Inpatient Hospital Stay (HOSPITAL_COMMUNITY): Payer: No Typology Code available for payment source

## 2019-12-12 LAB — GLUCOSE, CAPILLARY
Glucose-Capillary: 175 mg/dL — ABNORMAL HIGH (ref 70–99)
Glucose-Capillary: 190 mg/dL — ABNORMAL HIGH (ref 70–99)
Glucose-Capillary: 196 mg/dL — ABNORMAL HIGH (ref 70–99)
Glucose-Capillary: 217 mg/dL — ABNORMAL HIGH (ref 70–99)
Glucose-Capillary: 218 mg/dL — ABNORMAL HIGH (ref 70–99)
Glucose-Capillary: 252 mg/dL — ABNORMAL HIGH (ref 70–99)

## 2019-12-12 LAB — TRIGLYCERIDES: Triglycerides: 312 mg/dL — ABNORMAL HIGH (ref <150)

## 2019-12-12 LAB — BASIC METABOLIC PANEL
Anion gap: 14 (ref 5–15)
BUN: 33 mg/dL — ABNORMAL HIGH (ref 6–20)
CO2: 21 mmol/L — ABNORMAL LOW (ref 22–32)
Calcium: 9.1 mg/dL (ref 8.9–10.3)
Chloride: 100 mmol/L (ref 98–111)
Creatinine, Ser: 2.25 mg/dL — ABNORMAL HIGH (ref 0.61–1.24)
GFR calc Af Amer: 47 mL/min — ABNORMAL LOW (ref >60)
GFR calc non Af Amer: 40 mL/min — ABNORMAL LOW (ref >60)
Glucose, Bld: 210 mg/dL — ABNORMAL HIGH (ref 70–99)
Potassium: 5 mmol/L (ref 3.5–5.1)
Sodium: 135 mmol/L (ref 135–145)

## 2019-12-12 LAB — VANCOMYCIN, RANDOM: Vancomycin Rm: 19

## 2019-12-12 LAB — URINE CULTURE: Culture: <10000 — AB

## 2019-12-12 LAB — MRSA PCR SCREENING: MRSA by PCR: NEGATIVE

## 2019-12-12 MED ORDER — VITAL HIGH PROTEIN PO LIQD
1000.0000 mL | ORAL | Status: DC
  Administered 2019-12-12 – 2019-12-13 (×2): 1000 mL
  Filled 2019-12-12 (×2): qty 1000

## 2019-12-12 MED ORDER — VANCOMYCIN HCL 1500 MG/300ML IV SOLN
1500.0000 mg | Freq: Once | INTRAVENOUS | Status: AC
  Administered 2019-12-12: 1500 mg via INTRAVENOUS
  Filled 2019-12-12: qty 300

## 2019-12-12 MED ORDER — INFLUENZA VAC SPLIT QUAD 0.5 ML IM SUSY
0.5000 mL | PREFILLED_SYRINGE | INTRAMUSCULAR | Status: AC
  Administered 2019-12-13: 0.5 mL via INTRAMUSCULAR
  Filled 2019-12-12: qty 0.5

## 2019-12-12 MED ORDER — PRO-STAT SUGAR FREE PO LIQD
30.0000 mL | Freq: Four times a day (QID) | ORAL | Status: DC
  Administered 2019-12-12 (×3): 30 mL
  Filled 2019-12-12 (×3): qty 30

## 2019-12-12 MED ORDER — METOPROLOL TARTRATE 12.5 MG HALF TABLET
12.5000 mg | ORAL_TABLET | Freq: Two times a day (BID) | ORAL | Status: DC
  Administered 2019-12-12 – 2019-12-13 (×3): 12.5 mg
  Filled 2019-12-12 (×3): qty 1

## 2019-12-12 MED ORDER — DEXMEDETOMIDINE HCL IN NACL 400 MCG/100ML IV SOLN
0.4000 ug/kg/h | INTRAVENOUS | Status: DC
  Administered 2019-12-12: 1 ug/kg/h via INTRAVENOUS
  Administered 2019-12-12: 0.4 ug/kg/h via INTRAVENOUS
  Administered 2019-12-12 (×4): 1 ug/kg/h via INTRAVENOUS
  Administered 2019-12-13 (×4): 1.2 ug/kg/h via INTRAVENOUS
  Administered 2019-12-13: 0.9 ug/kg/h via INTRAVENOUS
  Administered 2019-12-13: 1.2 ug/kg/h via INTRAVENOUS
  Filled 2019-12-12 (×13): qty 100

## 2019-12-12 MED ORDER — INSULIN GLARGINE 100 UNIT/ML ~~LOC~~ SOLN
10.0000 [IU] | Freq: Every day | SUBCUTANEOUS | Status: DC
  Administered 2019-12-12: 10 [IU] via SUBCUTANEOUS
  Filled 2019-12-12 (×2): qty 0.1

## 2019-12-12 NOTE — Consult Note (Signed)
NAME:  Benjamin Rosario, MRN:  [id number], DOB:  2001-07-20, LOS: 2 ADMISSION DATE:  12/10/2019, CONSULTATION DATE:  12/11/2019 REFERRING MD:  Heide SparkNarendra MD (IMRS), CHIEF COMPLAINT:  SOB   Brief History   19 yr old M w/ PMHx HTN, pre diabetes, Hypothyroidism, OSA, OHS, pulmonary stenosis, unilateral paraseptal emphysema and COVID Pneumonia in 09/2019 s/p convalescent plasma presents on 04/01 with SOB w/ productive cough and was started on NIPPV after needing 15L NRB. PCCM called to evaluate due to increased work of breathing on NIPPV. Endotracheally intubated for acute on chronic respiratory failure with hypoxia and hypercarbia  History of present illness   ( History obtained from review of the EMR and acct of other providers including brief conversation with patient prior to intubation)  19 yr old M w/ PMHx HTN, pre-diabetes, Hypothyroidism (on Levothyroxine 75 mcg), OSA ( ? Compliance with CPAP), OHS, pulmonary stenosis, unilateral paraseptal emphysema and COVID Pneumonia in 09/2019 s/p convalescent plasma presents on 04/01 with SOB w/ productive cough.  Per documentation by IMTS pt c/o left frontal headache 3/10 and non radiating left sided chest pain 7/10 when his symptoms started on 04/01 in AM.  He continued to have progressive worsening of his dyspnea w/ diaphoresis but no nausea. He had lightheadedness but no syncopal event.  His chest pain worsened with any exertion and improved with rest.  On evaluation in ED pt was started on 15L NRB and was noted to have tachypnea.  He was transitioned to NIPPV and admitted by Internal Medicine. PCCM called to evaluate when pt was noted to have increased work of breathing on NIPPV.  On my evaluation pt sitting at edge of bed with increased work of breathing and occasional nodding. Rousable but looked weak and was visibly uncomfortable.  NIPPV mask fit was optimal with minimal leak. His repeat COVID test was negative. Decision made to endotracheally intubate for  acute on chronic respiratory failure with hypoxia and hypercarbia.   Past Medical History  .Marland Kitchen. Active Ambulatory Problems    Diagnosis Date Noted  . Mixed hyperlipidemia 02/26/2011  . Obesity 02/26/2011  . Pre-diabetes 02/26/2011  . Other specified acquired hypothyroidism 02/26/2011  . MDD (major depressive disorder), single episode, moderate (HCC) 06/06/2013  . ADHD (attention deficit hyperactivity disorder), combined type 06/07/2013  . Concussion with no loss of consciousness 07/27/2015  . Hypothyroidism 11/25/2018  . Chlamydia 11/25/2018  . SOB (shortness of breath) 11/25/2018  . Chest pain 11/25/2018  . Emphysema lung (HCC) 11/25/2018  . Acute respiratory failure with hypoxemia (HCC) 06/07/2019  . Community acquired pneumonia 06/07/2019  . Acute respiratory failure with hypoxia (HCC) 06/10/2019  . Hypokalemia 06/10/2019  . Morbid obesity with BMI of 50.0-59.9, adult (HCC) 06/10/2019  . Chronic respiratory failure with hypoxia (HCC)   . Pneumonia due to COVID-19 virus 09/19/2019   Resolved Ambulatory Problems    Diagnosis Date Noted  . GAD (generalized anxiety disorder) 06/09/2013   Past Medical History:  Diagnosis Date  . ADHD (attention deficit hyperactivity disorder)   . Asthma   . Emphysema of lung (HCC) 11/25/2018  . Environmental allergies      Significant Hospital Events   04/02 increasing respiratory difficulty--> initiation of NIPPV--> increasing requirments--> failure of NIPPV 04/02>>Endotracheal intubation  Consults:  04/02>>PCCM  Procedures:  04/02>>Endotracheal Intubation  Significant Diagnostic Tests:  CTA chest on 12/10/2019 ( compared to previous imaging on 09/19/2019) FINDINGS: Cardiovascular: Thoracic aorta shows a normal branching pattern. No aneurysmal dilatation or dissection is seen. No significant  cardiac enlargement is noted. 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  The opacification of the pulmonary artery is somewhat limited secondary to the patient's body habitus and contrast timing bolus despite 2 attempts for optimum imaging. This is similar to that seen in January of 2021. No large central pulmonary embolus is seen. No pericardial effusion is noted.  Mediastinum/Nodes: Thoracic inlet is within normal limits. Scattered small mediastinal lymph nodes are noted. These are likely reactive in nature. The esophagus as visualized is within normal limits.  Lungs/Pleura: Right lung demonstrates significant emphysematous changes and volume loss consistent with the absent pulmonary artery. These changes are stable from the prior exam. The left lung is well aerated and demonstrates patchy infiltrates throughout the left lung. This is consistent with that seen on recent chest x-ray consistent with multifocal pneumonia. Additionally there may be some sequelae from the patient's known COVID-19 infection. No sizable effusion is noted. Focal pleural thickening is again noted in the left apex laterally stable dating back to 2020 and likely of a benign etiology. Upper Abdomen: Visualized upper abdomen shows no acute abnormality. Musculoskeletal: Bony structures are within normal limits.  LA 2.0 D- dimer 0.57 ABG 7.37/46/127/26 Trop peaking at 127 from 66 Micro Data:  09/2019>> + SARSCOV2 12/10/2019>> negative SARSCOV2  12/10/2019>> negative for Influenza A and B 04/01>>Blood cx x 2>> pending  04/02>>Tracheal Aspirate >> rare GPC, cx pending   Antimicrobials:  04/01>> Ceftriaxone 04/01>> Azithromycin Post intubation increased coverage: 04/02>> Vancomycin 04/02>> Cefepime   Objective   Blood pressure (!) 185/138, pulse (!) 105, temperature 97.9 F (36.6 C), temperature source Axillary, resp. rate 20, height 6\' 1"  (1.854 m), weight (!) 207.2 kg, SpO2 (!) 84 %.    Vent Mode: PRVC FiO2 (%):  [40 %-70 %] 40 % Set Rate:  [28 bmp] 28 bmp Vt Set:   [630 mL] 630 mL PEEP:  [10 cmH20] 10 cmH20 Pressure Support:  [8 cmH20] 8 cmH20 Plateau Pressure:  [25 cmH20-28 cmH20] 26 cmH20   Intake/Output Summary (Last 24 hours) at 12/12/2019 0920 Last data filed at 12/12/2019 0800 Gross per 24 hour  Intake 1491.07 ml  Output 1300 ml  Net 191.07 ml   Filed Weights   12/10/19 2335 12/11/19 0400 12/12/19 0431  Weight: (!) 210.2 kg (!) 221 kg (!) 207.2 kg    Examination: General: morbidly obese,sleeping in bed in NAD  HENT: normocephalic atraumatic, ETT and OGT in place  Lungs: difficult to auscultate due to body habitus, ronchi anteriorly  Cardiovascular: tachycardic, nl S1 and S2  Abdomen: abdomen is obese, soft, NTND, hypoactive BS   Extremities: warm and well perfused, non pitting pedal edema bilaterally, RLE 1+ pitting edema  Neuro: no focal deficits, moving all 4 extremities on commands, easily arouses to verbal stimulus   Assessment & Plan:  # Acute on chronic respiratory failure with combination of hypoxia and hypercarbia 2/2 PNA # Obesity Hypoventilation and Obstructive sleep apnea Afebrile and hemodynamically stable. Currently doing well on full ventilator support.  - Did not tolerate SBT this AM. Will continue full vent support, wean per protocol  - Agitated this AM when propofol gtt turned off. Will continue fentanyl gtt and start Precedex gtt  - Continue HAP coverage with cefepime. Vancomycin stopped in the setting of worsening renal function  - Continue scheduled duonebs  - F/u tracheal aspirate and Bcx  # AKI with previous normal renal function  # HTN  - Cr 1.47 -->  2.25, suspect multifactorial from vancomycin and contrast-induced (CTA chest 48 hours ago) - Hold off on fluids in the setting of HTN, will stop vancomycin and reassess renal function tomorrow  - Stop HCTZ and start low dose metoprolol for BP control. Will have to monitor for bradycardia as we are also starting Precedex gtt  - Continue amlodipine 10 mg QD  - BMP in  AM   # H/o Paraseptal Emphysema on right Lucency of R hemithorax is most likely 2/2 h/o congenital Pulmonary stenosis. Decreased blood flow may contribute to etiology. In addition pt has a h/o smoking, heavy marijuana use per outpatient pulm visit and previous evaluation. No PFTs were completed. He was evaluated by [facility]; per their assessment "pulmonary valve with a subtle structural abnormality and there is some mild residual turbulence across this valve." There was no flow obstruction and only a mild gradient at the time therefore he did not require surgical intervention.  - Bronchodilators Q 6H   # Type II NSTEMI No complaints of chest pain today. Troponin 66->127 without EKG changes.  - Continue to monitor for cardiac symptoms   # T2DM: A1c 7.1 with random BG > 200 - Start Lantus 10 units  - Continue ISS keep BG 140-180 mg/dl while inpatient  # H/o Hypothyroidism: TSH 2.5 - Continue levothyroxine 75 mcg    Best practice:  Diet: has OGT start on TF Pain/Anxiety/Delirium protocol (if indicated): Fentanyl and Precedex gtt Sedation: Rass goal -1 to -2 per protocol VAP protocol (if indicated): yes DVT prophylaxis: SCDs and Lovenox GI prophylaxis: PPI  Glucose control: ISS pt has BG 252 and Glucosuria on UA Mobility: bedrest Q 2 Code Status:  Full Family Communication: Mom is NOK Jeb Levering( she is a Engineer, civil (consulting)) [phone], updated today  Disposition: pending improvement in respiratory status   Labs   CBC: Recent Labs  Lab 12/10/19 1557 12/10/19 1821 12/11/19 0349 12/11/19 0446 12/11/19 0708 12/11/19 0755 12/11/19 0930  WBC 12.8*  --  20.1*  --   --   --   --   NEUTROABS 8.9*  --   --   --   --   --   --   HGB 14.3   < > 15.7 16.3 15.6 16.0 15.6  HCT 46.7   < > 51.8 48.0 46.0 47.0 46.0  MCV 81.8  --  82.2  --   --   --   --   PLT 383  --  489*  --   --   --   --    < > = values in this interval not displayed.    Basic Metabolic Panel: Recent Labs  Lab 12/10/19 1557  12/10/19 1821 12/11/19 0349 12/11/19 0446 12/11/19 0708 12/11/19 0755 12/11/19 0930 12/11/19 1218 12/12/19 0301  NA 137   < > 135   < > 133* 132* 133* 136 135  K 4.0   < > 5.5*   < > 7.0* 7.0* 6.3* 5.1 5.0  CL 101  --  98  --   --   --   --  101 100  CO2 24  --  25  --   --   --   --  22 21*  GLUCOSE 133*  --  252*  --   --   --   --  205* 210*  BUN 12  --  12  --   --   --   --  19 33*  CREATININE 0.95  --  1.09  --   --   --   --  1.47* 2.25*  CALCIUM 8.7*  --  9.1  --   --   --   --  8.8* 9.1  MG 1.7  --   --   --   --   --   --   --   --    < > = values in this interval not displayed.   GFR: Estimated Creatinine Clearance: 96.9 mL/min (A) (by C-G formula based on SCr of 2.25 mg/dL (H)). Recent Labs  Lab 12/10/19 1557 12/10/19 1632 12/10/19 2114 12/11/19 0349  PROCALCITON  --   --  <0.10  --   WBC 12.8*  --   --  20.1*  LATICACIDVEN  --  2.0* 2.3* 2.8*    Liver Function Tests: Recent Labs  Lab 12/10/19 1726  AST 25  ALT 27  ALKPHOS 64  BILITOT 0.5  PROT 6.4*  ALBUMIN 3.4*   No results for input(s): LIPASE, AMYLASE in the last 168 hours. No results for input(s): AMMONIA in the last 168 hours.  ABG    Component Value Date/Time   PHART 7.323 (L) 12/11/2019 0930   PCO2ART 48.9 (H) 12/11/2019 0930   PO2ART 92.0 12/11/2019 0930   HCO3 25.3 12/11/2019 0930   TCO2 27 12/11/2019 0930   ACIDBASEDEF 1.0 12/11/2019 0930   O2SAT 96.0 12/11/2019 0930     Coagulation Profile: Recent Labs  Lab 12/10/19 1726  INR 1.0    Cardiac Enzymes: No results for input(s): CKTOTAL, CKMB, CKMBINDEX, TROPONINI in the last 168 hours.  HbA1C: Hgb A1c MFr Bld  Date/Time Value Ref Range Status  12/11/2019 03:49 AM 7.1 (H) 4.8 - 5.6 % Final    Comment:    (NOTE) Pre diabetes:          5.7%-6.4% Diabetes:              >6.4% Glycemic control for   <7.0% adults with diabetes   06/08/2019 01:50 PM 6.5 (H) 4.8 - 5.6 % Final    Comment:    (NOTE) Pre diabetes:           5.7%-6.4% Diabetes:              >6.4% Glycemic control for   <7.0% adults with diabetes     CBG: Recent Labs  Lab 12/11/19 1533 12/11/19 1920 12/11/19 2331 12/12/19 0410 12/12/19 0741  GLUCAP 184* 192* 217* 218* 196*    Review of Systems:   Marland KitchenMarland KitchenReview of Systems  Constitutional: Positive for diaphoresis and malaise/fatigue.  HENT: Negative.   Eyes: Negative.   Respiratory: Positive for cough, shortness of breath and wheezing.   Cardiovascular: Positive for chest pain and leg swelling.  Gastrointestinal: Negative.   Genitourinary: Negative.   Musculoskeletal: Negative.   Neurological: Positive for dizziness and headaches. Negative for seizures and loss of consciousness.  Endo/Heme/Allergies: Negative.   Psychiatric/Behavioral: The patient is nervous/anxious.    ROS completed prior to intubation  Past Medical History  He,  has a past medical history of ADHD (attention deficit hyperactivity disorder), Asthma, Chlamydia (11/25/2018), Emphysema of lung (HCC) (11/25/2018), Environmental allergies, and Hypothyroidism (11/25/2018).   Surgical History   History reviewed. No pertinent surgical history.   Social History   reports that he has never smoked. He has never used smokeless tobacco. He reports current drug use. Drug: Marijuana. He reports that he does not drink alcohol.   Family History   His family history includes Diabetes  in his father; Hypertension in his father.   Allergies No Known Allergies   Home Medications  Prior to Admission medications   Medication Sig Start Date End Date Taking? Authorizing Provider  albuterol (PROVENTIL HFA;VENTOLIN HFA) 108 (90 Base) MCG/ACT inhaler Inhale 2 puffs into the lungs every 6 (six) hours as needed for wheezing or shortness of breath. 11/28/18  Yes Purohit, Salli Quarry, MD  mometasone-formoterol (DULERA) 100-5 MCG/ACT AERO Inhale 2 puffs into the lungs 2 (two) times daily. 06/09/19  Yes Rai, Ripudeep K, MD  amLODipine (NORVASC) 10 MG  tablet Take 1 tablet (10 mg total) by mouth daily. 06/15/19   Almon Hercules, MD  hydrochlorothiazide (MICROZIDE) 12.5 MG capsule Take 1 capsule (12.5 mg total) by mouth daily. Patient not taking: Reported on 12/10/2019 06/15/19   Almon Hercules, MD  levothyroxine (SYNTHROID) 75 MCG tablet Take 1 tablet (75 mcg total) by mouth daily before breakfast. Patient not taking: Reported on 12/10/2019 06/09/19   Rai, Delene Ruffini, MD  predniSONE (STERAPRED UNI-PAK 21 TAB) 10 MG (21) TBPK tablet Per dose pack Patient not taking: Reported on 12/10/2019 09/20/19   Almon Hercules, MD     Critical care time: 65 mins    Burna Cash, MD  Internal Medicine PGY-3

## 2019-12-12 NOTE — Progress Notes (Signed)
Pharmacy Antibiotic Note  Benjamin Rosario is a 19 y.o. male admitted on 12/10/2019 with SOB and chest pain.  Pharmacy has been consulted for vancomycin/cefepime dosing for left-sided PNA.  Patient has an AKI - SCr 0.95 >> 2.25.  Vancomycin random level is 19 mcg/mL this morning (goal for redosing < 20 mcg/mL).   Afebrile, WBC 20.1, PCT negative.  Plan: Vanc 1500mg  IV x 1 Continue cefepime 2g IV Q8H Monitor renal fxn, clinical progress, PRN vanc levels   Height: 6\' 1"  (185.4 cm) Weight: (!) 207.2 kg (456 lb 12.7 oz) IBW/kg (Calculated) : 79.9  Temp (24hrs), Avg:99 F (37.2 C), Min:98.3 F (36.8 C), Max:99.9 F (37.7 C)  Recent Labs  Lab 12/10/19 1557 12/10/19 1632 12/10/19 2114 12/11/19 0349 12/11/19 1218 12/12/19 0301  WBC 12.8*  --   --  20.1*  --   --   CREATININE 0.95  --   --  1.09 1.47* 2.25*  LATICACIDVEN  --  2.0* 2.3* 2.8*  --   --   VANCORANDOM  --   --   --   --   --  19    Estimated Creatinine Clearance: 96.9 mL/min (A) (by C-G formula based on SCr of 2.25 mg/dL (H)).    No Known Allergies  Vanc 4/2 >> Cefepime 4/2 >>  4/2 Vanc 2500mg  load, then 1500mg  x1 (was q8h) >> SCr up 1.47, stop vanc 4/3 VR = 19 mcg/mL >> give 1500mg  x 1 (SCr up 2.25)  4/1 covid / flu - negative 4/1 UCx -  4/1 BCx -  4/2 TA - GPC on stain 4/2 MRSA PCR -   Benjamin Apo D. 02/11/20, PharmD, BCPS, BCCCP 12/12/2019, 7:17 AM

## 2019-12-12 NOTE — Progress Notes (Signed)
eLink Physician-Brief Progress Note Patient Name: Benjamin Rosario DOB: 07-18-2001 MRN: [id number]   Date of Service  12/12/2019  HPI/Events of Note  Notified of high peak pressures 50s reportedly since throughout the day on PRVC 28/630/50%/10 PEEP On Fentanyl and Precedex  eICU Interventions  Plateau pressure 30, autoPEEP 17, I:E1:1.7 Sedation increased, Versed push to be given Decreased rate to 22 to hopefully decrease autoPEEP. Also adjust TV to target 7cc/kg for now. ABG after an hour and adjust accordingly     Intervention Category Major Interventions: Respiratory failure - evaluation and management  Darl Pikes 12/12/2019, 7:42 PM

## 2019-12-13 ENCOUNTER — Inpatient Hospital Stay (HOSPITAL_COMMUNITY): Payer: No Typology Code available for payment source

## 2019-12-13 DIAGNOSIS — J189 Pneumonia, unspecified organism: Secondary | ICD-10-CM

## 2019-12-13 LAB — POCT I-STAT 7, (LYTES, BLD GAS, ICA,H+H)
Acid-base deficit: 1 mmol/L (ref 0.0–2.0)
Bicarbonate: 25.1 mmol/L (ref 20.0–28.0)
Calcium, Ion: 1.27 mmol/L (ref 1.15–1.40)
HCT: 44 % (ref 39.0–52.0)
Hemoglobin: 15 g/dL (ref 13.0–17.0)
O2 Saturation: 95 %
Patient temperature: 99.1
Potassium: 4.7 mmol/L (ref 3.5–5.1)
Sodium: 139 mmol/L (ref 135–145)
TCO2: 26 mmol/L (ref 22–32)
pCO2 arterial: 45.6 mmHg (ref 32.0–48.0)
pH, Arterial: 7.35 (ref 7.350–7.450)
pO2, Arterial: 79 mmHg — ABNORMAL LOW (ref 83.0–108.0)

## 2019-12-13 LAB — LEGIONELLA PNEUMOPHILA SEROGP 1 UR AG: L. pneumophila Serogp 1 Ur Ag: NEGATIVE

## 2019-12-13 LAB — GLUCOSE, CAPILLARY
Glucose-Capillary: 136 mg/dL — ABNORMAL HIGH (ref 70–99)
Glucose-Capillary: 151 mg/dL — ABNORMAL HIGH (ref 70–99)
Glucose-Capillary: 158 mg/dL — ABNORMAL HIGH (ref 70–99)
Glucose-Capillary: 180 mg/dL — ABNORMAL HIGH (ref 70–99)
Glucose-Capillary: 191 mg/dL — ABNORMAL HIGH (ref 70–99)
Glucose-Capillary: 192 mg/dL — ABNORMAL HIGH (ref 70–99)
Glucose-Capillary: 192 mg/dL — ABNORMAL HIGH (ref 70–99)
Glucose-Capillary: 224 mg/dL — ABNORMAL HIGH (ref 70–99)
Glucose-Capillary: 227 mg/dL — ABNORMAL HIGH (ref 70–99)
Glucose-Capillary: 273 mg/dL — ABNORMAL HIGH (ref 70–99)
Glucose-Capillary: 280 mg/dL — ABNORMAL HIGH (ref 70–99)
Glucose-Capillary: 289 mg/dL — ABNORMAL HIGH (ref 70–99)
Glucose-Capillary: 290 mg/dL — ABNORMAL HIGH (ref 70–99)

## 2019-12-13 LAB — CBC
HCT: 45.4 % (ref 39.0–52.0)
Hemoglobin: 13.8 g/dL (ref 13.0–17.0)
MCH: 24.8 pg — ABNORMAL LOW (ref 26.0–34.0)
MCHC: 30.4 g/dL (ref 30.0–36.0)
MCV: 81.5 fL (ref 80.0–100.0)
Platelets: 408 10*3/uL — ABNORMAL HIGH (ref 150–400)
RBC: 5.57 MIL/uL (ref 4.22–5.81)
RDW: 17.1 % — ABNORMAL HIGH (ref 11.5–15.5)
WBC: 17.2 10*3/uL — ABNORMAL HIGH (ref 4.0–10.5)
nRBC: 0 % (ref 0.0–0.2)

## 2019-12-13 LAB — BASIC METABOLIC PANEL
Anion gap: 12 (ref 5–15)
BUN: 45 mg/dL — ABNORMAL HIGH (ref 6–20)
CO2: 23 mmol/L (ref 22–32)
Calcium: 9.1 mg/dL (ref 8.9–10.3)
Chloride: 105 mmol/L (ref 98–111)
Creatinine, Ser: 1.37 mg/dL — ABNORMAL HIGH (ref 0.61–1.24)
GFR calc Af Amer: >60 mL/min (ref >60)
GFR calc non Af Amer: >60 mL/min (ref >60)
Glucose, Bld: 295 mg/dL — ABNORMAL HIGH (ref 70–99)
Potassium: 4.8 mmol/L (ref 3.5–5.1)
Sodium: 140 mmol/L (ref 135–145)

## 2019-12-13 LAB — CULTURE, RESPIRATORY W GRAM STAIN: Culture: NORMAL

## 2019-12-13 MED ORDER — HYDRALAZINE HCL 25 MG PO TABS
25.0000 mg | ORAL_TABLET | Freq: Four times a day (QID) | ORAL | Status: DC | PRN
  Administered 2019-12-13 – 2019-12-14 (×2): 25 mg via ORAL
  Filled 2019-12-13 (×2): qty 1

## 2019-12-13 MED ORDER — SODIUM CHLORIDE 0.9 % IV SOLN: INTRAVENOUS | Status: DC

## 2019-12-13 MED ORDER — PANTOPRAZOLE SODIUM 40 MG PO PACK
40.0000 mg | PACK | Freq: Every day | ORAL | Status: DC
  Administered 2019-12-13: 40 mg
  Filled 2019-12-13 (×2): qty 20

## 2019-12-13 MED ORDER — INSULIN GLARGINE 100 UNIT/ML ~~LOC~~ SOLN
30.0000 [IU] | Freq: Every day | SUBCUTANEOUS | Status: DC
  Administered 2019-12-13 – 2019-12-14 (×2): 30 [IU] via SUBCUTANEOUS
  Filled 2019-12-13 (×3): qty 0.3

## 2019-12-13 MED ORDER — METOPROLOL TARTRATE 12.5 MG HALF TABLET
25.0000 mg | ORAL_TABLET | Freq: Two times a day (BID) | ORAL | Status: DC
  Administered 2019-12-13 – 2019-12-14 (×2): 25 mg
  Filled 2019-12-13 (×2): qty 2

## 2019-12-13 MED ORDER — DEXTROSE 50 % IV SOLN: 0.0000 mL | INTRAVENOUS | Status: DC | PRN

## 2019-12-13 MED ORDER — CHLORHEXIDINE GLUCONATE 0.12 % MT SOLN
OROMUCOSAL | Status: AC
  Filled 2019-12-13: qty 15

## 2019-12-13 MED ORDER — DEXTROSE-NACL 5-0.45 % IV SOLN: INTRAVENOUS | Status: DC

## 2019-12-13 MED ORDER — INSULIN REGULAR(HUMAN) IN NACL 100-0.9 UT/100ML-% IV SOLN
INTRAVENOUS | Status: DC
  Administered 2019-12-13: 9 [IU]/h via INTRAVENOUS
  Filled 2019-12-13: qty 100

## 2019-12-13 NOTE — Consult Note (Signed)
NAME:  Benjamin Rosario, MRN:  [id number], DOB:  05/31/2001, LOS: 3 ADMISSION DATE:  12/10/2019, CONSULTATION DATE:  12/11/2019 REFERRING MD:  Heide SparkNarendra MD (IMRS), CHIEF COMPLAINT:  SOB   Brief History   19 yr old M w/ PMHx HTN, pre diabetes, Hypothyroidism, OSA, OHS, pulmonary stenosis, unilateral paraseptal emphysema and COVID Pneumonia in 09/2019 s/p convalescent plasma presents on 04/01 with SOB w/ productive cough and was started on NIPPV after needing 15L NRB. PCCM called to evaluate due to increased work of breathing on NIPPV. Endotracheally intubated for acute on chronic respiratory failure with hypoxia and hypercarbia  History of present illness   ( History obtained from review of the EMR and acct of other providers including brief conversation with patient prior to intubation)  19 yr old M w/ PMHx HTN, pre-diabetes, Hypothyroidism (on Levothyroxine 75 mcg), OSA ( ? Compliance with CPAP), OHS, pulmonary stenosis, unilateral paraseptal emphysema and COVID Pneumonia in 09/2019 s/p convalescent plasma presents on 04/01 with SOB w/ productive cough.  Per documentation by IMTS pt c/o left frontal headache 3/10 and non radiating left sided chest pain 7/10 when his symptoms started on 04/01 in AM.  He continued to have progressive worsening of his dyspnea w/ diaphoresis but no nausea. He had lightheadedness but no syncopal event.  His chest pain worsened with any exertion and improved with rest.  On evaluation in ED pt was started on 15L NRB and was noted to have tachypnea.  He was transitioned to NIPPV and admitted by Internal Medicine. PCCM called to evaluate when pt was noted to have increased work of breathing on NIPPV.  On my evaluation pt sitting at edge of bed with increased work of breathing and occasional nodding. Rousable but looked weak and was visibly uncomfortable.  NIPPV mask fit was optimal with minimal leak. His repeat COVID test was negative. Decision made to endotracheally intubate for  acute on chronic respiratory failure with hypoxia and hypercarbia.   Past Medical History  .Marland Kitchen. Active Ambulatory Problems    Diagnosis Date Noted  . Mixed hyperlipidemia 02/26/2011  . Obesity 02/26/2011  . Pre-diabetes 02/26/2011  . Other specified acquired hypothyroidism 02/26/2011  . MDD (major depressive disorder), single episode, moderate (HCC) 06/06/2013  . ADHD (attention deficit hyperactivity disorder), combined type 06/07/2013  . Concussion with no loss of consciousness 07/27/2015  . Hypothyroidism 11/25/2018  . Chlamydia 11/25/2018  . SOB (shortness of breath) 11/25/2018  . Chest pain 11/25/2018  . Emphysema lung (HCC) 11/25/2018  . Acute respiratory failure with hypoxemia (HCC) 06/07/2019  . Community acquired pneumonia 06/07/2019  . Acute respiratory failure with hypoxia (HCC) 06/10/2019  . Hypokalemia 06/10/2019  . Morbid obesity with BMI of 50.0-59.9, adult (HCC) 06/10/2019  . Chronic respiratory failure with hypoxia (HCC)   . Pneumonia due to COVID-19 virus 09/19/2019   Resolved Ambulatory Problems    Diagnosis Date Noted  . GAD (generalized anxiety disorder) 06/09/2013   Past Medical History:  Diagnosis Date  . ADHD (attention deficit hyperactivity disorder)   . Asthma   . Emphysema of lung (HCC) 11/25/2018  . Environmental allergies      Significant Hospital Events   04/02 increasing respiratory difficulty--> initiation of NIPPV--> increasing requirments--> failure of NIPPV 04/02>>Endotracheal intubation  Consults:  04/02>>PCCM  Procedures:  04/02>>Endotracheal Intubation  Significant Diagnostic Tests:  CTA chest on 12/10/2019 ( compared to previous imaging on 09/19/2019) FINDINGS: Cardiovascular: Thoracic aorta shows a normal branching pattern. No aneurysmal dilatation or dissection is seen. No significant  cardiac enlargement is noted. 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  The opacification of the pulmonary artery is somewhat limited secondary to the patient's body habitus and contrast timing bolus despite 2 attempts for optimum imaging. This is similar to that seen in January of 2021. No large central pulmonary embolus is seen. No pericardial effusion is noted.  Mediastinum/Nodes: Thoracic inlet is within normal limits. Scattered small mediastinal lymph nodes are noted. These are likely reactive in nature. The esophagus as visualized is within normal limits.  Lungs/Pleura: Right lung demonstrates significant emphysematous changes and volume loss consistent with the absent pulmonary artery. These changes are stable from the prior exam. The left lung is well aerated and demonstrates patchy infiltrates throughout the left lung. This is consistent with that seen on recent chest x-ray consistent with multifocal pneumonia. Additionally there may be some sequelae from the patient's known COVID-19 infection. No sizable effusion is noted. Focal pleural thickening is again noted in the left apex laterally stable dating back to 2020 and likely of a benign etiology. Upper Abdomen: Visualized upper abdomen shows no acute abnormality. Musculoskeletal: Bony structures are within normal limits.  LA 2.0 D- dimer 0.57 ABG 7.37/46/127/26 Trop peaking at 127 from 66 Micro Data:  09/2019>> + SARSCOV2 12/10/2019>> negative SARSCOV2  12/10/2019>> negative for Influenza A and B 04/01>>Blood cx x 2>> negative at 3 days 04/02>>Tracheal Aspirate >> rare GPC, negative culture  Antimicrobials:  04/01>> Ceftriaxone 04/01>> Azithromycin Post intubation increased coverage: 04/02> 4/3 Vancomycin 04/02>> Cefepime  Objective   Blood pressure (!) 154/81, pulse 89, temperature 98.8 F (37.1 C), temperature source Oral, resp. rate (!) 26, height 6\' 1"  (1.854 m), weight (!) 210.5 kg, SpO2 99 %.    Vent Mode: PSV;CPAP FiO2 (%):  [40 %-60 %] 40 % Set Rate:  [22 bmp-28 bmp] 22  bmp Vt Set:  [560 mL-630 mL] 560 mL PEEP:  [5 cmH20-10 cmH20] 5 cmH20 Pressure Support:  [5 cmH20] 5 cmH20 Plateau Pressure:  [20 cmH20-31 cmH20] 23 cmH20   Intake/Output Summary (Last 24 hours) at 12/13/2019 1305 Last data filed at 12/13/2019 1100 Gross per 24 hour  Intake 2748.32 ml  Output 4550 ml  Net -1801.68 ml   Filed Weights   12/11/19 0400 12/12/19 0431 12/13/19 0354  Weight: (!) 221 kg (!) 207.2 kg (!) 210.5 kg    Physical exam: General: Morbidly obese young male, sleeping comfortably in bed, no acute distress HENT: ETT in OG tubes in place Lungs: Rhonchorous breath sounds bilaterally but improved from yesterday Cardiovascular: RRR, nl S1 and S2  Abdomen: Abdomen remains soft, NTND, hypoactive bowel sounds Extremities: Warm and well perfused with 1+ pitting edema bilaterally Neuro: Easily arouses to verbal stimulus, following commands, able to move all 4 extremities on command  Assessment & Plan:  # Acute on chronic respiratory failure with combination of hypoxia and hypercarbia 2/2 HAP # Obesity Hypoventilation and Obstructive sleep apnea -Repeat CXR without changes and persistent left mid/lower opacities -Passed spontaneous breathing trial and was extubated this morning -Doing well on 5 L of supplemental oxygen -Tracheal and blood cultures negative to date -Continue cefepime for 5-7 days, day 4 today for follow-up 5 days ago -Incentive spirometer    # AKI with previous normal renal function  # HTN  - Cr 2.25 -> 1.37 this AM after stopping vancomycin and HCTZ  - Will continue to monitor renal function, expect return to baseline - Avoid nephrotoxins - BP  140-160s; will continue amlodipine 10 mg and increase metoprolol to 25 mg BID  - Hydralazine 25 mg q6h PRN  - BMP in AM   # H/o Paraseptal Emphysema on right Lucency of R hemithorax is most likely 2/2 h/o congenital Pulmonary stenosis. Decreased blood flow may contribute to etiology. In addition pt has a h/o  smoking, heavy marijuana use per outpatient pulm visit and previous evaluation. No PFTs were completed. He was evaluated by [facility]; per their assessment "pulmonary valve with a subtle structural abnormality and there is some mild residual turbulence across this valve." There was no flow obstruction and only a mild gradient at the time therefore he did not require surgical intervention.  - Bronchodilators Q 6H   # Type II NSTEMI No complaints of chest pain today. Troponin 66->127 without EKG changes.  - Continue to monitor for cardiac symptoms   # T2DM: A1c 7.1 with random BG > 200 - Switched to endotool overnight due to hyperglycemia  - Start Lantus 30 units + resistant SSI  - Can adjust regimen as needed, would avoid endotool   # H/o Hypothyroidism: TSH 2.5 - Continue levothyroxine 75 mcg    Best practice:  Diet: ice chips pendign SLP eval  Pain/Anxiety/Delirium protocol (if indicated): none, off sedation  Sedation: Rass goal -1 to -2 per protocol VAP protocol (if indicated): yes DVT prophylaxis: SCDs and Lovenox GI prophylaxis: PPI  Glucose control: Lantus and SSI  Mobility: bedrest Q 2 Code Status:  Full Family Communication: Mom is NOK Richrd Prime( she is a Marine scientist) [phone], updated today  Disposition: med surg tomorrow if continues to do well   Labs   CBC: Recent Labs  Lab 12/10/19 1557 12/10/19 1821 12/11/19 0349 12/11/19 0446 12/11/19 0708 12/11/19 0755 12/11/19 0930 12/13/19 0253 12/13/19 0256  WBC 12.8*  --  20.1*  --   --   --   --   --  17.2*  NEUTROABS 8.9*  --   --   --   --   --   --   --   --   HGB 14.3   < > 15.7   < > 15.6 16.0 15.6 15.0 13.8  HCT 46.7   < > 51.8   < > 46.0 47.0 46.0 44.0 45.4  MCV 81.8  --  82.2  --   --   --   --   --  81.5  PLT 383  --  489*  --   --   --   --   --  408*   < > = values in this interval not displayed.    Basic Metabolic Panel: Recent Labs  Lab 12/10/19 1557 12/10/19 1821 12/11/19 0349 12/11/19 0446  12/11/19 0930 12/11/19 1218 12/12/19 0301 12/13/19 0253 12/13/19 0256  NA 137   < > 135   < > 133* 136 135 139 140  K 4.0   < > 5.5*   < > 6.3* 5.1 5.0 4.7 4.8  CL 101  --  98  --   --  101 100  --  105  CO2 24  --  25  --   --  22 21*  --  23  GLUCOSE 133*  --  252*  --   --  205* 210*  --  295*  BUN 12  --  12  --   --  19 33*  --  45*  CREATININE 0.95  --  1.09  --   --  1.47* 2.25*  --  1.37*  CALCIUM 8.7*  --  9.1  --   --  8.8* 9.1  --  9.1  MG 1.7  --   --   --   --   --   --   --   --    < > = values in this interval not displayed.   GFR: Estimated Creatinine Clearance: 160.7 mL/min (A) (by C-G formula based on SCr of 1.37 mg/dL (H)). Recent Labs  Lab 12/10/19 1557 12/10/19 1632 12/10/19 2114 12/11/19 0349 12/13/19 0256  PROCALCITON  --   --  <0.10  --   --   WBC 12.8*  --   --  20.1* 17.2*  LATICACIDVEN  --  2.0* 2.3* 2.8*  --     Liver Function Tests: Recent Labs  Lab 12/10/19 1726  AST 25  ALT 27  ALKPHOS 64  BILITOT 0.5  PROT 6.4*  ALBUMIN 3.4*   No results for input(s): LIPASE, AMYLASE in the last 168 hours. No results for input(s): AMMONIA in the last 168 hours.  ABG    Component Value Date/Time   PHART 7.350 12/13/2019 0253   PCO2ART 45.6 12/13/2019 0253   PO2ART 79.0 (L) 12/13/2019 0253   HCO3 25.1 12/13/2019 0253   TCO2 26 12/13/2019 0253   ACIDBASEDEF 1.0 12/13/2019 0253   O2SAT 95.0 12/13/2019 0253     Coagulation Profile: Recent Labs  Lab 12/10/19 1726  INR 1.0    Cardiac Enzymes: No results for input(s): CKTOTAL, CKMB, CKMBINDEX, TROPONINI in the last 168 hours.  HbA1C: Hgb A1c MFr Bld  Date/Time Value Ref Range Status  12/11/2019 03:49 AM 7.1 (H) 4.8 - 5.6 % Final    Comment:    (NOTE) Pre diabetes:          5.7%-6.4% Diabetes:              >6.4% Glycemic control for   <7.0% adults with diabetes   06/08/2019 01:50 PM 6.5 (H) 4.8 - 5.6 % Final    Comment:    (NOTE) Pre diabetes:          5.7%-6.4% Diabetes:               >6.4% Glycemic control for   <7.0% adults with diabetes     CBG: Recent Labs  Lab 12/13/19 0745 12/13/19 0855 12/13/19 1000 12/13/19 1052 12/13/19 1241  GLUCAP 290* 227* 224* 191* 192*    Review of Systems:   Marland KitchenMarland KitchenReview of Systems  Constitutional: Positive for diaphoresis and malaise/fatigue.  HENT: Negative.   Eyes: Negative.   Respiratory: Positive for cough, shortness of breath and wheezing.   Cardiovascular: Positive for chest pain and leg swelling.  Gastrointestinal: Negative.   Genitourinary: Negative.   Musculoskeletal: Negative.   Neurological: Positive for dizziness and headaches. Negative for seizures and loss of consciousness.  Endo/Heme/Allergies: Negative.   Psychiatric/Behavioral: The patient is nervous/anxious.    ROS completed prior to intubation  Past Medical History  He,  has a past medical history of ADHD (attention deficit hyperactivity disorder), Asthma, Chlamydia (11/25/2018), Emphysema of lung (HCC) (11/25/2018), Environmental allergies, and Hypothyroidism (11/25/2018).   Surgical History   History reviewed. No pertinent surgical history.   Social History   reports that he has never smoked. He has never used smokeless tobacco. He reports current drug use. Drug: Marijuana. He reports that he does not drink alcohol.   Family History   His family history includes Diabetes in his father;  Hypertension in his father.   Allergies No Known Allergies   Home Medications  Prior to Admission medications   Medication Sig Start Date End Date Taking? Authorizing Provider  albuterol (PROVENTIL HFA;VENTOLIN HFA) 108 (90 Base) MCG/ACT inhaler Inhale 2 puffs into the lungs every 6 (six) hours as needed for wheezing or shortness of breath. 11/28/18  Yes Purohit, Salli Quarry, MD  mometasone-formoterol (DULERA) 100-5 MCG/ACT AERO Inhale 2 puffs into the lungs 2 (two) times daily. 06/09/19  Yes Rai, Ripudeep K, MD  amLODipine (NORVASC) 10 MG tablet Take 1 tablet (10 mg  total) by mouth daily. 06/15/19   Almon Hercules, MD  hydrochlorothiazide (MICROZIDE) 12.5 MG capsule Take 1 capsule (12.5 mg total) by mouth daily. Patient not taking: Reported on 12/10/2019 06/15/19   Almon Hercules, MD  levothyroxine (SYNTHROID) 75 MCG tablet Take 1 tablet (75 mcg total) by mouth daily before breakfast. Patient not taking: Reported on 12/10/2019 06/09/19   Rai, Delene Ruffini, MD  predniSONE (STERAPRED UNI-PAK 21 TAB) 10 MG (21) TBPK tablet Per dose pack Patient not taking: Reported on 12/10/2019 09/20/19   Almon Hercules, MD     Critical care time: 65 mins    Burna Cash, MD  Internal Medicine PGY-3

## 2019-12-13 NOTE — Progress Notes (Signed)
SBP 170's. MD notified. Awaiting orders

## 2019-12-13 NOTE — Progress Notes (Signed)
eLink Physician-Brief Progress Note Patient Name: Benjamin Rosario DOB: 2001-03-07 MRN: [id number]   Date of Service  12/13/2019  HPI/Events of Note  Notified of glucose > 250 x 2 now  eICU Interventions  Follow protocol and proceed with Phase 2 insulin coverage     Intervention Category Major Interventions: Hyperglycemia - active titration of insulin therapy  Darl Pikes 12/13/2019, 12:24 AM

## 2019-12-13 NOTE — Progress Notes (Signed)
eLink Physician-Brief Progress Note Patient Name: Benjamin Rosario DOB: June 14, 2001 MRN: [id number]   Date of Service  12/13/2019  HPI/Events of Note  Notified of hypertension SBP 170s, HR 70s Patient appears to be adequately sedated  eICU Interventions  Ordered hydralazine 25 mg per tube q 6 prn     Intervention Category Major Interventions: Hypertension - evaluation and management  Rosalie Gums Correna Meacham 12/13/2019, 4:11 AM

## 2019-12-13 NOTE — Progress Notes (Signed)
Patient has weaned successfully, able to follow all commands.  Extubated to 10 L HFNC and patient has a very strong, productive cough, speaking and knows hospital, full name etc.  Doing great.

## 2019-12-14 DIAGNOSIS — Z6841 Body Mass Index (BMI) 40.0 and over, adult: Secondary | ICD-10-CM

## 2019-12-14 DIAGNOSIS — R778 Other specified abnormalities of plasma proteins: Secondary | ICD-10-CM

## 2019-12-14 LAB — BASIC METABOLIC PANEL
Anion gap: 13 (ref 5–15)
BUN: 32 mg/dL — ABNORMAL HIGH (ref 6–20)
CO2: 25 mmol/L (ref 22–32)
Calcium: 8.8 mg/dL — ABNORMAL LOW (ref 8.9–10.3)
Chloride: 105 mmol/L (ref 98–111)
Creatinine, Ser: 0.99 mg/dL (ref 0.61–1.24)
GFR calc Af Amer: >60 mL/min (ref >60)
GFR calc non Af Amer: >60 mL/min (ref >60)
Glucose, Bld: 178 mg/dL — ABNORMAL HIGH (ref 70–99)
Potassium: 3.9 mmol/L (ref 3.5–5.1)
Sodium: 143 mmol/L (ref 135–145)

## 2019-12-14 LAB — CBC
HCT: 46.4 % (ref 39.0–52.0)
Hemoglobin: 14.2 g/dL (ref 13.0–17.0)
MCH: 25 pg — ABNORMAL LOW (ref 26.0–34.0)
MCHC: 30.6 g/dL (ref 30.0–36.0)
MCV: 81.5 fL (ref 80.0–100.0)
Platelets: 419 10*3/uL — ABNORMAL HIGH (ref 150–400)
RBC: 5.69 MIL/uL (ref 4.22–5.81)
RDW: 17.9 % — ABNORMAL HIGH (ref 11.5–15.5)
WBC: 20.2 10*3/uL — ABNORMAL HIGH (ref 4.0–10.5)
nRBC: 0.1 % (ref 0.0–0.2)

## 2019-12-14 LAB — GLUCOSE, CAPILLARY
Glucose-Capillary: 172 mg/dL — ABNORMAL HIGH (ref 70–99)
Glucose-Capillary: 158 mg/dL — ABNORMAL HIGH (ref 70–99)
Glucose-Capillary: 125 mg/dL — ABNORMAL HIGH (ref 70–99)
Glucose-Capillary: 188 mg/dL — ABNORMAL HIGH (ref 70–99)
Glucose-Capillary: 128 mg/dL — ABNORMAL HIGH (ref 70–99)
Glucose-Capillary: 136 mg/dL — ABNORMAL HIGH (ref 70–99)

## 2019-12-14 MED ORDER — OXYCODONE HCL 5 MG PO TABS
5.0000 mg | ORAL_TABLET | ORAL | Status: DC | PRN
  Administered 2019-12-14 – 2019-12-15 (×2): 5 mg via ORAL
  Filled 2019-12-14 (×2): qty 1

## 2019-12-14 MED ORDER — PANTOPRAZOLE SODIUM 40 MG PO TBEC: 40.0000 mg | DELAYED_RELEASE_TABLET | Freq: Every day | ORAL | Status: DC

## 2019-12-14 MED ORDER — IPRATROPIUM-ALBUTEROL 0.5-2.5 (3) MG/3ML IN SOLN
3.0000 mL | Freq: Three times a day (TID) | RESPIRATORY_TRACT | Status: DC
  Administered 2019-12-15 – 2019-12-16 (×3): 3 mL via RESPIRATORY_TRACT
  Filled 2019-12-14 (×3): qty 3

## 2019-12-14 MED ORDER — LEVOTHYROXINE SODIUM 75 MCG PO TABS
75.0000 ug | ORAL_TABLET | Freq: Every day | ORAL | Status: DC
  Administered 2019-12-16: 75 ug via ORAL
  Filled 2019-12-14 (×2): qty 1

## 2019-12-14 MED ORDER — TRAMADOL HCL 50 MG PO TABS
50.0000 mg | ORAL_TABLET | Freq: Once | ORAL | Status: AC
  Administered 2019-12-14: 12:00:00 50 mg via ORAL
  Filled 2019-12-14: qty 1

## 2019-12-14 MED ORDER — CARVEDILOL 6.25 MG PO TABS
6.2500 mg | ORAL_TABLET | Freq: Two times a day (BID) | ORAL | Status: DC
  Administered 2019-12-14 – 2019-12-16 (×4): 6.25 mg via ORAL
  Filled 2019-12-14 (×4): qty 1

## 2019-12-14 MED ORDER — METOPROLOL TARTRATE 12.5 MG HALF TABLET: 25.0000 mg | ORAL_TABLET | Freq: Two times a day (BID) | ORAL | Status: DC

## 2019-12-14 MED ORDER — ACETAMINOPHEN 325 MG PO TABS
650.0000 mg | ORAL_TABLET | ORAL | Status: DC | PRN
  Administered 2019-12-14: 650 mg via ORAL
  Filled 2019-12-14: qty 2

## 2019-12-14 MED ORDER — AMLODIPINE BESYLATE 10 MG PO TABS
10.0000 mg | ORAL_TABLET | Freq: Every day | ORAL | Status: DC
  Administered 2019-12-15 – 2019-12-16 (×2): 10 mg via ORAL
  Filled 2019-12-14 (×2): qty 1

## 2019-12-14 NOTE — Progress Notes (Signed)
Pt still awake watching TV. RT asked pt if he was ready for cpap at this time, pt replied "no not yet" RT informed pt to call RN when he is ready to wear cpap.

## 2019-12-14 NOTE — Progress Notes (Signed)
Occupational Therapy Evaluation  PTA, pt lived independently with his girlfriend on a 3rd floor apt and worked as a Presenter, broadcasting. Pt seen on RA with brief desat to 86 after ambulation but quickly rebounded to low 90s. Required min A for LB ADL and min A +2 for mobility for safety/line management. Will follow acutely to facilitate safe DC home.     12/14/19 1300  OT Visit Information  Last OT Received On 12/14/19  Assistance Needed +2 (lines and safety)  PT/OT/SLP Co-Evaluation/Treatment Yes  Reason for Co-Treatment To address functional/ADL transfers;For patient/therapist safety  OT goals addressed during session ADL's and self-care  History of Present Illness Pt adm with acute respiratory failure due to bacterial PNA. Pt intubated 4/2-4/4. PMH - OSA/OHS, congenital pulmonary stenosis and abnormal right lung perfusion, chronic unilateral right paraseptal emphysema, HTN, DM2, covid PNA 1/21, morbid obesity  Precautions  Precautions Fall  Restrictions  Weight Bearing Restrictions No  Home Living  Family/patient expects to be discharged to: Private residence  Living Arrangements Spouse/significant other  Available Help at Discharge Friend(s);Available PRN/intermittently  Type of Home Apartment  Home Access Stairs to enter  Entrance Stairs-Number of Steps 3 flight  Entrance Stairs-Rails Right  Home Layout One level  Bathroom Biomedical scientist Yes  How Accessible Accessible via [place]  Home Equipment None  Prior Function  Level of Independence Independent  Comments works as Barrister's clerk No difficulties  Pain Assessment  Pain Assessment Faces  Faces Pain Scale 2  Pain Location neck (back of head)  Pain Descriptors / Indicators Sore  Pain Intervention(s) Limited activity within patient's tolerance  Cognition  Arousal/Alertness Awake/alert  Behavior During Therapy WFL for tasks  assessed/performed  Overall Cognitive Status Within Functional Limits for tasks assessed  Upper Extremity Assessment  Upper Extremity Assessment Generalized weakness (shaking with movement)  Lower Extremity Assessment  Lower Extremity Assessment Defer to PT evaluation  Cervical / Trunk Assessment  Cervical / Trunk Assessment Normal  ADL  Overall ADL's  Needs assistance/impaired  Grooming Set up;Sitting  Upper Body Bathing Set up;Sitting  Lower Body Bathing Minimal assistance;Sit to/from stand  Upper Body Dressing  Set up;Sitting  Lower Body Dressing Moderate assistance;Sit to/from Retail buyer Minimal assistance;+2 for safety/equipment  Toileting- Clothing Manipulation and Hygiene Minimal assistance  Functional mobility during ADLs Minimal assistance;+2 for safety/equipment;Rolling [place]  General ADL Comments difficulty reaching feet; unsteady; feels "weak". States girlfriend can assist at [place]- Assessment  Additional Comments Pt complained of vision being initially "blurry" when he "woke up". Appears back to baseline  Bed Mobility  Overal bed mobility Needs Assistance  Bed Mobility Supine to Sit  Supine to sit Min guard;HOB elevated  General bed mobility comments Assist for safety and lines. Incr time.  Transfers  Overall transfer level Needs assistance  Equipment used Rolling Blacketer (2 wheeled)  Transfers Sit to/from Stand  Sit to Stand +2 physical assistance;Min assist  General transfer comment Assist for stability and safety. Verbal cues for hand placement  Balance  Overall balance assessment Mild deficits observed, not formally tested  OT - End of Session  Activity Tolerance Patient tolerated treatment well  Patient left in chair;with call bell/phone within reach  Nurse Communication Mobility status  OT Assessment  OT Recommendation/Assessment Patient needs continued OT Services  OT Visit Diagnosis Unsteadiness on feet (R26.81);Muscle weakness  (generalized) (M62.81)  OT Problem List Decreased strength;Decreased activity tolerance;Decreased knowledge of use  of DME or AE;Obesity  OT Plan  OT Frequency (ACUTE ONLY) Min 2X/week  OT Treatment/Interventions (ACUTE ONLY) Self-care/ADL training;DME and/or AE instruction;Energy conservation;Therapeutic activities;Patient/family education;Therapeutic exercise  AM-PAC OT "6 Clicks" Daily Activity Outcome Measure (Version 2)  Help from another person eating meals? 4  Help from another person taking care of personal grooming? 3  Help from another person toileting, which includes using toliet, bedpan, or urinal? 3  Help from another person bathing (including washing, rinsing, drying)? 3  Help from another person to put on and taking off regular upper body clothing? 3  Help from another person to put on and taking off regular lower body clothing? 3  6 Click Score 19  OT Recommendation  Follow Up Recommendations No OT follow up;Supervision - Intermittent  OT Equipment Tub/shower seat (bariatric)  Individuals Consulted  Consulted and Agree with Results and Recommendations Patient  Acute Rehab OT Goals  Patient Stated Goal return home  OT Goal Formulation With patient/family  Time For Goal Achievement 12/28/19  Potential to Achieve Goals Good  OT Time Calculation  OT Start Time (ACUTE ONLY) 1050  OT Stop Time (ACUTE ONLY) 1120  OT Time Calculation (min) 30 min  OT General Charges  $OT Visit 1 Visit  OT Evaluation  $OT Eval Moderate Complexity 1 Mod  Written Expression  Dominant Hand Left  Luisa Dago, OT/L   Acute OT Clinical Specialist Acute Rehabilitation Services Pager [phone] Office [phone]

## 2019-12-14 NOTE — Progress Notes (Deleted)
Pt stated he could place self on cpap unit. RT added water to water chamber per pt request. RT will continue to monitor as needed.

## 2019-12-14 NOTE — Progress Notes (Signed)
Pt still awake, eating and talking on the phone. CPAP at bedside, RT will place pt on when he is ready to go to sleep.

## 2019-12-14 NOTE — Progress Notes (Signed)
eLink Physician-Brief Progress Note Patient Name: Benjamin Rosario DOB: Aug 16, 2001 MRN: [id number]   Date of Service  12/14/2019  HPI/Events of Note  Patient complaining of headache and weakness. Seen awake using his celphone.  eICU Interventions  Ordered Tylenol prn     Intervention Category Intermediate Interventions: Pain - evaluation and management  Darl Pikes 12/14/2019, 6:22 AM

## 2019-12-14 NOTE — Evaluation (Signed)
Physical Therapy Evaluation Patient Details Name: Benjamin Rosario MRN: [id number] DOB: 11-02-2000 Today's Date: 12/14/2019   History of Present Illness  Pt adm with acute respiratory failure due to bacterial PNA. Pt intubated 4/2-4/4. 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 Expect pt will make excellent progress toward returning mobility to baseline. Biggest barrier to returning to his apartment is that it is on the 3rd floor. May need to consider going home with mother if her residence is more accessible.     Follow Up Recommendations No PT follow up;Supervision - Intermittent    Equipment Recommendations  Other (comment)(To be determined)    Recommendations for Other Services       Precautions / Restrictions Precautions Precautions: Fall Restrictions Weight Bearing Restrictions: No      Mobility  Bed Mobility Overal bed mobility: Needs Assistance Bed Mobility: Supine to Sit     Supine to sit: Min guard;HOB elevated     General bed mobility comments: Assist for safety and lines. Incr time.  Transfers Overall transfer level: Needs assistance Equipment used: Rolling Schiltz (2 wheeled) Transfers: Sit to/from Stand Sit to Stand: +2 physical assistance;Min assist         General transfer comment: Assist for stability and safety. Verbal cues for hand placement  Ambulation/Gait Ambulation/Gait assistance: +2 physical assistance;Min assist;Min guard Gait Distance (Feet): 100 Feet Assistive device: Rolling Charlot (2 wheeled) Gait Pattern/deviations: Step-through pattern;Decreased stride length;Wide base of support Gait velocity: decr Gait velocity interpretation: <1.31 ft/sec, indicative of household ambulator General Gait Details: Assist for safety and  support. Pt initially slightly unsteady requiring min assist. Progressed to min guard as stability improved.   Stairs            Wheelchair Mobility    Modified Rankin (Stroke Patients Only)       Balance Overall balance assessment: Mild deficits observed, not formally tested                                           Pertinent Vitals/Pain Pain Assessment: Faces Faces Pain Scale: Hurts a little bit Pain Location: neck Pain Descriptors / Indicators: Sore Pain Intervention(s): Monitored during session;Repositioned    Home Living Family/patient expects to be discharged to:: Private residence Living Arrangements: Spouse/significant other Available Help at Discharge: Friend(s);Available PRN/intermittently Type of Home: Apartment Home Access: Stairs to enter Entrance Stairs-Rails: Right Entrance Stairs-Number of Steps: 3 flight Home Layout: One level Home Equipment: None      Prior Function Level of Independence: Independent         Comments: works as Development worker, [employer]   Dominant Hand: Left    Extremity/Trunk Assessment   Upper Extremity Assessment Upper Extremity Assessment: Defer to OT evaluation    Lower Extremity Assessment Lower Extremity Assessment: Generalized weakness       Communication   Communication: No difficulties  Cognition Arousal/Alertness: Awake/alert Behavior During Therapy: WFL for tasks assessed/performed Overall Cognitive Status: Within Functional Limits for tasks assessed  General Comments General comments (skin integrity, edema, etc.): Amb on RA with SpO2 dropping briefly to 85% but returning to above 90 quickly.     Exercises     Assessment/Plan    PT Assessment Patient needs continued PT services  PT Problem List Decreased strength;Decreased activity tolerance;Decreased balance;Decreased mobility;Obesity       PT Treatment  Interventions DME instruction;Gait training;Stair training;Functional mobility training;Therapeutic activities;Therapeutic exercise;Patient/family education    PT Goals (Current goals can be found in the Care Plan section)  Acute Rehab PT Goals Patient Stated Goal: return home PT Goal Formulation: With patient Time For Goal Achievement: 12/21/19 Potential to Achieve Goals: Good    Frequency Min 3X/week   Barriers to discharge Inaccessible home environment lives in 3rd floor apt without elevator    Co-evaluation               AM-PAC PT "6 Clicks" Mobility  Outcome Measure Help needed turning from your back to your side while in a flat bed without using bedrails?: A Little Help needed moving from lying on your back to sitting on the side of a flat bed without using bedrails?: A Little Help needed moving to and from a bed to a chair (including a wheelchair)?: A Little Help needed standing up from a chair using your arms (e.g., wheelchair or bedside chair)?: A Little Help needed to walk in hospital room?: A Little Help needed climbing 3-5 steps with a railing? : A Little 6 Click Score: 18    End of Session   Activity Tolerance: Patient tolerated treatment well Patient left: in chair;with call bell/phone within reach Nurse Communication: Mobility status PT Visit Diagnosis: Unsteadiness on feet (R26.81);Muscle weakness (generalized) (M62.81)    Time: 1050-1120 PT Time Calculation (min) (ACUTE ONLY): 30 min   Charges:   PT Evaluation $PT Eval Moderate Complexity: 1 Mod          [facility] PT Acute Rehabilitation Services Pager [phone] Office [phone]   Angelina Ok [facility] 12/14/2019, 1:00 PM

## 2019-12-14 NOTE — Progress Notes (Signed)
ICU to medical service transfer:  [facility] will take over patient 12/15/2019. Benjamin Rosario is a 20 year old male with obesity, OSA, congenital pulmonary artery abnormality, HTN, DM, hypothyroidism, and history of COVID-19 infection in January 2021 who presented with shortness of breath, chest pain and hypoxia, he required ICU admission and intubated.  He did not require pressors.  He received steroids and also antibiotics for HCAP. He had some AKI likely secondary to bank and contrast which resolved. He clinically improved and he is currently extubated.   -[facility] will take over patient care for/6/21

## 2019-12-14 NOTE — Evaluation (Signed)
Clinical/Bedside Swallow Evaluation Patient Details  Name: Monique Gift MRN: [id number] Date of Birth: Mar 04, 2001  Today's Date: 12/14/2019 Time: SLP Start Time (ACUTE ONLY): 1119 SLP Stop Time (ACUTE ONLY): 1128 SLP Time Calculation (min) (ACUTE ONLY): 9 min  Past Medical History:  Past Medical History:  Diagnosis Date  . ADHD (attention deficit hyperactivity disorder)   . Asthma   . Chlamydia 11/25/2018  . Emphysema of lung ([place]) 11/25/2018  . Environmental allergies   . Hypothyroidism 11/25/2018   Past Surgical History: History reviewed. No pertinent surgical history. HPI:  19 year old with PMHx significant for congenital pulmonary stenosis, prior diagnoses of pneumonia, GERD, recent COVID-19 infection 3 months ago, HTN, prediabetes, hypothyroidism, morbid obesity and asthma who presented to the hospital with shortness of breath and chest pain. Found to have respiratroy failure and pneumonia. Intubated 4/2-4/4, placed on 10 L HFNC. Per chart, mom reported in the last 2 years, he has gained about 150 pounds. BSE 06/11/19 ot [facility] (admitted with hypoxic respiratory failure). No s/s during [employer] test and reviewed reflux precautions (denied dysphagia except when gulping drinks, reporting pressure in throat which clears).   Assessment / Plan / Recommendation Clinical Impression  Pt demonstrated a lower risk for aspiration given strong volitional cough, relatively clear vocal quality/intensity, intubation not exceeding two days and intact cognition. Swallow initiation during cup and straw sips appeared within normal range without cough or throat clear. He reports mild odonophagia since extubation. He does have a historyr of pneumonia which doctor's suspect is due to his congenital pulmonary stenosis. Recommend he continue thin liquids, regular texture, straws allowed and pills with liquid. No follow up needed at this time.   SLP Visit Diagnosis: Dysphagia, unspecified (R13.10)    Aspiration  Risk  Mild aspiration risk    Diet Recommendation Regular;Thin liquid   Liquid Administration via: Straw;Cup Medication Administration: Whole meds with liquid Compensations: Small sips/bites;Slow rate Postural Changes: Seated upright at 90 degrees    Other  Recommendations Oral Care Recommendations: Oral care BID   Follow up Recommendations None      Frequency and Duration            Prognosis        Swallow Study   General HPI: 19 year old with PMHx significant for congenital pulmonary stenosis, prior diagnoses of pneumonia, GERD, recent COVID-19 infection 3 months ago, HTN, prediabetes, hypothyroidism, morbid obesity and asthma who presented to the hospital with shortness of breath and chest pain. Found to have respiratroy failure and pneumonia. Intubated 4/2-4/4, placed on 10 L HFNC. Per chart, mom reported in the last 2 years, he has gained about 150 pounds. BSE 06/11/19 ot [facility] (admitted with hypoxic respiratory failure). No s/s during [employer] test and reviewed reflux precautions (denied dysphagia except when gulping drinks, reporting pressure in throat which clears). Type of Study: Bedside Swallow Evaluation Previous Swallow Assessment: (none) Diet Prior to this Study: Regular;Thin liquids Temperature Spikes Noted: No Respiratory Status: Other (comment)(HFNC 6) History of Recent Intubation: Yes Length of Intubations (days): 2 days Date extubated: 12/13/19 Behavior/Cognition: Alert;Cooperative;Pleasant mood Oral Cavity Assessment: Other (comment)(candidias on tongue) Oral Care Completed by SLP: No Oral Cavity - Dentition: Adequate natural dentition Vision: Functional for self-feeding Self-Feeding Abilities: Able to feed self Patient Positioning: Upright in chair Baseline Vocal Quality: Normal Volitional Cough: Strong Volitional Swallow: Able to elicit    Oral/Motor/Sensory Function Overall Oral Motor/Sensory Function: Within functional limits   Ice Chips Ice chips:  Not tested  Thin Liquid Thin Liquid: Within functional limits Presentation: Cup;Straw    Nectar Thick Nectar Thick Liquid: Not tested   Honey Thick Honey Thick Liquid: Not tested   Puree Puree: Not tested   Solid     Solid: Within functional limits      Royce Macadamia 12/14/2019,11:50 AM  Breck Coons Lonell Face.Ed Nurse, children's [phone] Office [phone]

## 2019-12-14 NOTE — Consult Note (Addendum)
NAME:  Benjamin Rosario, MRN:  [id number], DOB:  [birth year], LOS: 4 ADMISSION DATE:  12/10/2019, CONSULTATION DATE:  12/11/2019 REFERRING MD:  Heide Spark MD (IMRS), CHIEF COMPLAINT:  SOB   Brief History   19 yr old M w/ PMHx HTN, pre diabetes, Hypothyroidism, OSA, OHS, pulmonary stenosis, unilateral paraseptal emphysema and COVID Pneumonia in 09/2019 s/p convalescent plasma presents on 04/01 with SOB w/ productive cough and was started on NIPPV after needing 15L NRB. PCCM called to evaluate due to increased work of breathing on NIPPV. Endotracheally intubated for acute on chronic respiratory failure with hypoxia and hypercarbia  History of present illness   ( History obtained from review of the EMR and acct of other providers including brief conversation with patient prior to intubation)  19 yr old M w/ PMHx HTN, pre-diabetes, Hypothyroidism (on Levothyroxine 75 mcg), OSA ( ? Compliance with CPAP), OHS, pulmonary stenosis, unilateral paraseptal emphysema and COVID Pneumonia in 09/2019 s/p convalescent plasma presents on 04/01 with SOB w/ productive cough.  Per documentation by IMTS pt c/o left frontal headache 3/10 and non radiating left sided chest pain 7/10 when his symptoms started on 04/01 in AM.  He continued to have progressive worsening of his dyspnea w/ diaphoresis but no nausea. He had lightheadedness but no syncopal event.  His chest pain worsened with any exertion and improved with rest.  On evaluation in ED pt was started on 15L NRB and was noted to have tachypnea.  He was transitioned to NIPPV and admitted by Internal Medicine. PCCM called to evaluate when pt was noted to have increased work of breathing on NIPPV.  On my evaluation pt sitting at edge of bed with increased work of breathing and occasional nodding. Rousable but looked weak and was visibly uncomfortable.  NIPPV mask fit was optimal with minimal leak. His repeat COVID test was negative. Decision made to endotracheally intubate for  acute on chronic respiratory failure with hypoxia and hypercarbia.   Past Medical History  .Marland Kitchen Active Ambulatory Problems    Diagnosis Date Noted  . Mixed hyperlipidemia 02/26/2011  . Obesity 02/26/2011  . Pre-diabetes 02/26/2011  . Other specified acquired hypothyroidism 02/26/2011  . MDD (major depressive disorder), single episode, moderate (HCC) 06/06/2013  . ADHD (attention deficit hyperactivity disorder), combined type 06/07/2013  . Concussion with no loss of consciousness 07/27/2015  . Hypothyroidism 11/25/2018  . Chlamydia 11/25/2018  . SOB (shortness of breath) 11/25/2018  . Chest pain 11/25/2018  . Emphysema lung (HCC) 11/25/2018  . Acute respiratory failure with hypoxemia (HCC) 06/07/2019  . Community acquired pneumonia 06/07/2019  . Acute respiratory failure with hypoxia (HCC) 06/10/2019  . Hypokalemia 06/10/2019  . Morbid obesity with BMI of 50.0-59.9, adult (HCC) 06/10/2019  . Chronic respiratory failure with hypoxia (HCC)   . Pneumonia due to COVID-19 virus 09/19/2019   Resolved Ambulatory Problems    Diagnosis Date Noted  . GAD (generalized anxiety disorder) 06/09/2013   Past Medical History:  Diagnosis Date  . ADHD (attention deficit hyperactivity disorder)   . Asthma   . Emphysema of lung (HCC) 11/25/2018  . Environmental allergies      Significant Hospital Events   04/02 increasing respiratory difficulty--> initiation of NIPPV--> increasing requirments--> failure of NIPPV 04/02>>Endotracheal intubation  Consults:  04/02>>PCCM  Procedures:  04/02>>Endotracheal Intubation 4/4 >> Extubation   Significant Diagnostic Tests:  CTA chest on 12/10/2019 ( compared to previous imaging on 09/19/2019) FINDINGS: Cardiovascular: Thoracic aorta shows a normal branching pattern. No aneurysmal dilatation or dissection  is seen. No significant cardiac enlargement is noted. 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 The opacification of the pulmonary artery is somewhat limited secondary to the patient's body habitus and contrast timing bolus despite 2 attempts for optimum imaging. This is similar to that seen in January of 2021. No large central pulmonary embolus is seen. No pericardial effusion is noted.  Mediastinum/Nodes: Thoracic inlet is within normal limits. Scattered small mediastinal lymph nodes are noted. These are likely reactive in nature. The esophagus as visualized is within normal limits.  Lungs/Pleura: Right lung demonstrates significant emphysematous changes and volume loss consistent with the absent pulmonary artery. These changes are stable from the prior exam. The left lung is well aerated and demonstrates patchy infiltrates throughout the left lung. This is consistent with that seen on recent chest x-ray consistent with multifocal pneumonia. Additionally there may be some sequelae from the patient's known COVID-19 infection. No sizable effusion is noted. Focal pleural thickening is again noted in the left apex laterally stable dating back to 2020 and likely of a benign etiology. Upper Abdomen: Visualized upper abdomen shows no acute abnormality. Musculoskeletal: Bony structures are within normal limits.  LA 2.0 D- dimer 0.57 ABG 7.37/46/127/26 Trop peaking at 127 from 66  Micro Data:  09/2019>> + SARSCOV2 12/10/2019>> negative SARSCOV2  12/10/2019>> negative for Influenza A and B 04/01>>Blood cx x 2>> negative at 3 days 04/02>>Tracheal Aspirate >> rare GPC, negative culture  Antimicrobials:  04/01>> Ceftriaxone 04/01>> Azithromycin Post intubation increased coverage: 04/02> 4/3 Vancomycin 04/02>> Cefepime  Objective   Blood pressure (!) 178/106, pulse 82, temperature 98.8 F (37.1 C), temperature source Oral, resp. rate (!) 27, height 6\' 1"  (1.854 m), weight (!) 210.5 kg, SpO2 100 %.        Intake/Output Summary (Last 24 hours) at 12/14/2019  1228 Last data filed at 12/14/2019 0800 Gross per 24 hour  Intake 3882.49 ml  Output 4400 ml  Net -517.51 ml   Filed Weights   12/12/19 0431 12/13/19 0354 12/14/19 0345  Weight: (!) 207.2 kg (!) 210.5 kg (!) 210.5 kg    Physical exam: General: Morbidly obese male, eating in bed, no acute distress Lungs: Rhonchorous breath sounds on the left Cardiovascular: RRR, nl S1 and S2  Abdomen: Abdomen remains soft, NTND, hypoactive bowel sounds Extremities: Warm and well perfused with, bilateral nonpitting edema  Neuro: Alert oriented x3, answering questions appropriately, able to move all 4 extremities   Assessment & Plan:  # Acute on chronic respiratory failure with combination of hypoxia and hypercarbia 2/2 HAP # Obesity Hypoventilation and Obstructive sleep apnea - Repeat CXR yesterday without changes and persistent left mid/lower opacities - Extubated yesterday and doing well on 6L HFNC, will plan to wean as able  - Tracheal and blood cultures negative to date - Last day of cefepime today, total of 5 days  - Incentive spirometer   - Transfer out of ICU, signed out to Dr. Myrtie Hawk with IMTS  - PT/OT   # AKI with previous normal renal function  # HTN  - Likely contrast-induced + vancomycin use - Renal function now back to baseline  - Avoid nephrotoxins - BP 140-160s; will continue amlodipine 10 mg and metoprolol to 25 mg BID  - PO Hydralazine 25 mg q6h PRN  - consider resuming home HCTZ tomorrow   # H/o Paraseptal Emphysema on right Lucency of R hemithorax is most likely 2/2 h/o congenital Pulmonary stenosis. Decreased  blood flow may contribute to etiology. In addition pt has a h/o smoking, heavy marijuana use per outpatient pulm visit and previous evaluation. No PFTs were completed. He was evaluated by [facility]; per their assessment "pulmonary valve with a subtle structural abnormality and there is some mild residual turbulence across this valve." There was no flow obstruction and only a  mild gradient at the time therefore he did not require surgical intervention.  - Bronchodilators Q 6H  - Stop IV steroids   # Type II NSTEMI:  No complaints of chest pain today. Troponin 66->127 without EKG changes.   # T2DM: A1c 7.1 with random BG > 200 - Remains hyperglycemic this AM, expect this to improve not that he is off steroids  - Continue Lantus 30 units + resistant SSI  - Can adjust regimen as needed  # H/o Hypothyroidism: TSH 2.5 on admission  - Continue levothyroxine 75 mcg    Best practice:  Diet: CM diet  Pain/Anxiety/Delirium protocol (if indicated): none, off sedation  Sedation: N/A VAP protocol (if indicated): N/A, extubated  DVT prophylaxis: SCDs and Lovenox GI prophylaxis: PPI  Glucose control: Lantus and SSI  Mobility: ad lib  Code Status:  Full Family Communication: Mom is NOK Jeb Levering( she is a nurse) [phone], updated today  Disposition: med surg  Labs   CBC: Recent Labs  Lab 12/10/19 1557 12/10/19 1821 12/11/19 0349 12/11/19 0446 12/11/19 0755 12/11/19 0930 12/13/19 0253 12/13/19 0256 12/14/19 0330  WBC 12.8*  --  20.1*  --   --   --   --  17.2* 20.2*  NEUTROABS 8.9*  --   --   --   --   --   --   --   --   HGB 14.3   < > 15.7   < > 16.0 15.6 15.0 13.8 14.2  HCT 46.7   < > 51.8   < > 47.0 46.0 44.0 45.4 46.4  MCV 81.8  --  82.2  --   --   --   --  81.5 81.5  PLT 383  --  489*  --   --   --   --  408* 419*   < > = values in this interval not displayed.    Basic Metabolic Panel: Recent Labs  Lab 12/10/19 1557 12/10/19 1821 12/11/19 0349 12/11/19 0446 12/11/19 1218 12/12/19 0301 12/13/19 0253 12/13/19 0256 12/14/19 0330  NA 137   < > 135   < > 136 135 139 140 143  K 4.0   < > 5.5*   < > 5.1 5.0 4.7 4.8 3.9  CL 101   < > 98  --  101 100  --  105 105  CO2 24   < > 25  --  22 21*  --  23 25  GLUCOSE 133*   < > 252*  --  205* 210*  --  295* 178*  BUN 12   < > 12  --  19 33*  --  45* 32*  CREATININE 0.95   < > 1.09  --   1.47* 2.25*  --  1.37* 0.99  CALCIUM 8.7*   < > 9.1  --  8.8* 9.1  --  9.1 8.8*  MG 1.7  --   --   --   --   --   --   --   --    < > = values in this interval not displayed.   GFR: Estimated  Creatinine Clearance: 222.4 mL/min (by C-G formula based on SCr of 0.99 mg/dL). Recent Labs  Lab 12/10/19 1557 12/10/19 1632 12/10/19 2114 12/11/19 0349 12/13/19 0256 12/14/19 0330  PROCALCITON  --   --  <0.10  --   --   --   WBC 12.8*  --   --  20.1* 17.2* 20.2*  LATICACIDVEN  --  2.0* 2.3* 2.8*  --   --     Liver Function Tests: Recent Labs  Lab 12/10/19 1726  AST 25  ALT 27  ALKPHOS 64  BILITOT 0.5  PROT 6.4*  ALBUMIN 3.4*   No results for input(s): LIPASE, AMYLASE in the last 168 hours. No results for input(s): AMMONIA in the last 168 hours.  ABG    Component Value Date/Time   PHART 7.350 12/13/2019 0253   PCO2ART 45.6 12/13/2019 0253   PO2ART 79.0 (L) 12/13/2019 0253   HCO3 25.1 12/13/2019 0253   TCO2 26 12/13/2019 0253   ACIDBASEDEF 1.0 12/13/2019 0253   O2SAT 95.0 12/13/2019 0253     Coagulation Profile: Recent Labs  Lab 12/10/19 1726  INR 1.0    Cardiac Enzymes: No results for input(s): CKTOTAL, CKMB, CKMBINDEX, TROPONINI in the last 168 hours.  HbA1C: Hgb A1c MFr Bld  Date/Time Value Ref Range Status  12/11/2019 03:49 AM 7.1 (H) 4.8 - 5.6 % Final    Comment:    (NOTE) Pre diabetes:          5.7%-6.4% Diabetes:              >6.4% Glycemic control for   <7.0% adults with diabetes   06/08/2019 01:50 PM 6.5 (H) 4.8 - 5.6 % Final    Comment:    (NOTE) Pre diabetes:          5.7%-6.4% Diabetes:              >6.4% Glycemic control for   <7.0% adults with diabetes     CBG: Recent Labs  Lab 12/13/19 2345 12/14/19 0332 12/14/19 0551 12/14/19 0728 12/14/19 1141  GLUCAP 180* 172* 158* 125* 188*    Review of Systems:     Past Medical History  He,  has a past medical history of ADHD (attention deficit hyperactivity disorder), Asthma,  Chlamydia (11/25/2018), Emphysema of lung (HCC) (11/25/2018), Environmental allergies, and Hypothyroidism (11/25/2018).   Surgical History   History reviewed. No pertinent surgical history.   Social History   reports that he has never smoked. He has never used smokeless tobacco. He reports current drug use. Drug: Marijuana. He reports that he does not drink alcohol.   Family History   His family history includes Diabetes in his father; Hypertension in his father.   Allergies No Known Allergies   Home Medications  Prior to Admission medications   Medication Sig Start Date End Date Taking? Authorizing Provider  albuterol (PROVENTIL HFA;VENTOLIN HFA) 108 (90 Base) MCG/ACT inhaler Inhale 2 puffs into the lungs every 6 (six) hours as needed for wheezing or shortness of breath. 11/28/18  Yes Purohit, Salli Quarry, MD  mometasone-formoterol (DULERA) 100-5 MCG/ACT AERO Inhale 2 puffs into the lungs 2 (two) times daily. 06/09/19  Yes Rai, Ripudeep K, MD  amLODipine (NORVASC) 10 MG tablet Take 1 tablet (10 mg total) by mouth daily. 06/15/19   Almon Hercules, MD  hydrochlorothiazide (MICROZIDE) 12.5 MG capsule Take 1 capsule (12.5 mg total) by mouth daily. Patient not taking: Reported on 12/10/2019 06/15/19   Almon Hercules, MD  levothyroxine (SYNTHROID)  75 MCG tablet Take 1 tablet (75 mcg total) by mouth daily before breakfast. Patient not taking: Reported on 12/10/2019 06/09/19   Rai, Delene Ruffiniipudeep K, MD  predniSONE (STERAPRED UNI-PAK 21 TAB) 10 MG (21) TBPK tablet Per dose pack Patient not taking: Reported on 12/10/2019 09/20/19   Almon HerculesGonfa, Taye T, MD     Critical care time: 65 mins    Burna CashIdalys Santos-Sanchez, MD  Internal Medicine PGY-3    Pulmonary critical care attending:  This is a 19 year old gentleman with a past medical history of hypertension, hypothyroidism, OSA/OHS, history of pulmonary stenosis, history of COVID-19 pneumonia in January 2021.  Patient was admitted for concern of acute on chronic hypoxemic  hypercarbic respiratory failure.  Patient was extubated yesterday.  Tolerated extubation well.  Patient remains hypertensive.  Due to AKI some of his previous home medications for blood pressure were held.  Patient diagnosed with left-sided pneumonia.  Cultures at this point remain negative but CT scan with upper and lower lobe opacities.  Patient remains in the intensive care unit  BP (!) 146/89   Pulse 99   Temp 98.8 F (37.1 C) (Oral)   Resp 19   Ht 6\' 1"  (1.854 m)   Wt (!) 210.5 kg   SpO2 100%   BMI 61.23 kg/m   General: Young male resting in bed, appears comfortable HEENT: Tracking appropriately, large neck Heart: Regular rate rhythm S1-S2 Lungs: Some rhonchi in the left compared to the right, clears with coughing. Abdomen: Obese, soft  Labs: Reviewed CT imaging of the chest: Reviewed with paraseptal emphysema on the right, left-sided infiltrates present.  Assessment: Acute on chronic hypoxemic hypercarbic respiratory failure secondary to pneumonia, cultures at this point negative however responding to antimicrobial therapy. Elevated troponin likely secondary to demand ischemia Acute renal failure, AKI Hypertension Hypothyroidism Type 2 diabetes with hyperglycemia  Plan: Complete 5 days cefepime today. Taper off steroids Up in chair out of bed today Avoid nephrotoxic agents P.o. hydralazine and continuation of amlodipine for blood pressure control Lantus plus SSI changed to resistant scale Stable for transfer out of the intensive care unit.  Pickup planned for IM teaching service tomorrow 12/15/2019.  Josephine IgoBradley L Vinia Jemmott, DO [facility] 12/14/2019 3:44 PM

## 2019-12-14 NOTE — Progress Notes (Signed)
Pt complains of headache and feeling "dizzy and weak." Full neuro assessment done and neuro assessment remains unchanged from previous assessments and WNL. CBG checked which was 158. BP elevated but normal for pt. Pt remains in bed resting with personal laptop near by. Call bell in reach with bed alarm on.  [facility] notified, awaiting PRN medicine for headache. Will continue to monitor.

## 2019-12-14 NOTE — Progress Notes (Signed)
Report called to [facility] RN, tx w/ Mohammed Kindle, recvd by RN w/ vss.

## 2019-12-15 DIAGNOSIS — J438 Other emphysema: Secondary | ICD-10-CM

## 2019-12-15 DIAGNOSIS — E1165 Type 2 diabetes mellitus with hyperglycemia: Secondary | ICD-10-CM

## 2019-12-15 DIAGNOSIS — N179 Acute kidney failure, unspecified: Secondary | ICD-10-CM

## 2019-12-15 LAB — CBC
HCT: 48.2 % (ref 39.0–52.0)
Hemoglobin: 14.8 g/dL (ref 13.0–17.0)
MCH: 24.7 pg — ABNORMAL LOW (ref 26.0–34.0)
MCHC: 30.7 g/dL (ref 30.0–36.0)
MCV: 80.6 fL (ref 80.0–100.0)
Platelets: 408 10*3/uL — ABNORMAL HIGH (ref 150–400)
RBC: 5.98 MIL/uL — ABNORMAL HIGH (ref 4.22–5.81)
RDW: 17.9 % — ABNORMAL HIGH (ref 11.5–15.5)
WBC: 16.8 10*3/uL — ABNORMAL HIGH (ref 4.0–10.5)
nRBC: 0 % (ref 0.0–0.2)

## 2019-12-15 LAB — BASIC METABOLIC PANEL
Anion gap: 10 (ref 5–15)
BUN: 28 mg/dL — ABNORMAL HIGH (ref 6–20)
CO2: 26 mmol/L (ref 22–32)
Calcium: 8.6 mg/dL — ABNORMAL LOW (ref 8.9–10.3)
Chloride: 104 mmol/L (ref 98–111)
Creatinine, Ser: 0.9 mg/dL (ref 0.61–1.24)
GFR calc Af Amer: >60 mL/min (ref >60)
GFR calc non Af Amer: >60 mL/min (ref >60)
Glucose, Bld: 95 mg/dL (ref 70–99)
Potassium: 3.9 mmol/L (ref 3.5–5.1)
Sodium: 140 mmol/L (ref 135–145)

## 2019-12-15 LAB — CULTURE, BLOOD (ROUTINE X 2)
Culture: NO GROWTH
Culture: NO GROWTH
Special Requests: ADEQUATE
Special Requests: ADEQUATE

## 2019-12-15 LAB — GLUCOSE, CAPILLARY
Glucose-Capillary: 101 mg/dL — ABNORMAL HIGH (ref 70–99)
Glucose-Capillary: 101 mg/dL — ABNORMAL HIGH (ref 70–99)
Glucose-Capillary: 119 mg/dL — ABNORMAL HIGH (ref 70–99)
Glucose-Capillary: 77 mg/dL (ref 70–99)
Glucose-Capillary: 89 mg/dL (ref 70–99)

## 2019-12-15 MED ORDER — INSULIN ASPART 100 UNIT/ML ~~LOC~~ SOLN: 4.0000 [IU] | Freq: Three times a day (TID) | SUBCUTANEOUS | Status: DC

## 2019-12-15 MED ORDER — INSULIN GLARGINE 100 UNIT/ML ~~LOC~~ SOLN
20.0000 [IU] | Freq: Every day | SUBCUTANEOUS | Status: DC
  Filled 2019-12-15: qty 0.2

## 2019-12-15 MED ORDER — LOSARTAN POTASSIUM 25 MG PO TABS: 25.0000 mg | ORAL_TABLET | Freq: Every day | ORAL | Status: DC

## 2019-12-15 MED ORDER — INSULIN GLARGINE 100 UNIT/ML ~~LOC~~ SOLN
10.0000 [IU] | Freq: Every day | SUBCUTANEOUS | Status: DC
  Administered 2019-12-15: 10 [IU] via SUBCUTANEOUS
  Filled 2019-12-15 (×2): qty 0.1

## 2019-12-15 MED ORDER — INSULIN ASPART 100 UNIT/ML ~~LOC~~ SOLN: 0.0000 [IU] | Freq: Three times a day (TID) | SUBCUTANEOUS | Status: DC

## 2019-12-15 MED ORDER — HYDROCHLOROTHIAZIDE 12.5 MG PO CAPS
12.5000 mg | ORAL_CAPSULE | Freq: Every day | ORAL | Status: DC
  Administered 2019-12-15 – 2019-12-16 (×2): 12.5 mg via ORAL
  Filled 2019-12-15 (×2): qty 1

## 2019-12-15 NOTE — Progress Notes (Signed)
Transfer Note: Benjamin Rosario is a 19 yo M w/ a PMHx notable for congenital pulmonary stenosis, Covid pneumonia 2019, hypertension, hypothyroidism, morbid obesity, prediabetes and asthma who initially presented to the emergency department for worsening shortness of breath and productive cough of yellowish phlegm.  He was found to be in acute hypoxic hypercapnic respiratory failure secondary to probably left-sided pneumonia with negative repeat Covid testing.  The patient decompensated despite utilization of noninvasive ventilation and required ICU transfer for intubation.  He completed 5 days of antibiotic therapy prior to release from the ICU, was extubated on 4/4 and was completing a tapering course of stress dose corticosteroids on 4/5.  The patient was up ambulating without assistance and determined to be safe for transfer to the floor on 12/14/2019.  His stay was complicated by an acute renal injury and hyperglycemia.  Subjective:   Pt was seen at the bedside today.  He denies concerns other than requesting information for dietary adjustments.  The patient appears motivated to lose weight.  He endorses eating very large meals but denies snacking.  He understands that his condition was likely at least partially due to to his weight and as such he would like to lose weight.  He denies pain or dyspnea.  He endorses being able to ambulate the hallway with minimal shortness of breath which is improving rapidly.  Objective: CBC Latest Ref Rng & Units 12/14/2019 12/13/2019 12/13/2019  WBC 4.0 - 10.5 K/uL 20.2(H) 17.2(H) -  Hemoglobin 13.0 - 17.0 g/dL 32.9 92.4 26.8  Hematocrit 39.0 - 52.0 % 46.4 45.4 44.0  Platelets 150 - 400 K/uL 419(H) 408(H) -   BMP Latest Ref Rng & Units 12/14/2019 12/13/2019 12/13/2019  Glucose 70 - 99 mg/dL 341(D) 622(W) -  BUN 6 - 20 mg/dL 97(L) 89(Q) -  Creatinine 0.61 - 1.24 mg/dL 1.19 4.17(E) -  Sodium 135 - 145 mmol/L 143 140 139  Potassium 3.5 - 5.1 mmol/L 3.9 4.8 4.7  Chloride 98  - 111 mmol/L 105 105 -  CO2 22 - 32 mmol/L 25 23 -  Calcium 8.9 - 10.3 mg/dL 0.8(X) 9.1 -   Vital signs in last 24 hours: Vitals:   12/14/19 1825 12/14/19 1847 12/15/19 0013 12/15/19 0425  BP: (!) 169/104 (!) 144/63 (!) 158/91 (!) 150/75  Pulse: 87 90 79 81  Resp: 20 20 19 19   Temp:  97.9 F (36.6 C) 97.7 F (36.5 C) 97.7 F (36.5 C)  TempSrc:  Oral Oral Oral  SpO2: 100% 95% 97% 97%  Weight:    (!) 212.1 kg  Height:       General: A/O x4, in no acute distress, afebrile, nondiaphoretic HEENT: PEERL, EMO intact Cardio: RRR, no mrg's  Pulmonary: CTA bilaterally, no wheezing or crackles  Abdomen: Bowel sounds normal, soft, nontender  MSK: BLE nontender, nonedematous Psych: Appropriate affect, not depressed in appearance, engages well  Assessment/Plan:  Principal Problem:   Acute respiratory failure with hypoxia (HCC) Active Problems:   Hypothyroidism   Community acquired pneumonia   Morbid obesity with BMI of 50.0-59.9, adult (HCC)   Elevated troponin  Assessment: Benjamin Rosario is a 19 yo M w/ a PMHx notable for congenital pulmonary stenosis, Covid pneumonia 2019, hypertension, hypothyroidism, morbid obesity, prediabetes and asthma who initially presented to the emergency department for worsening shortness of breath and productive cough of yellowish phlegm.  He was found to have probably left-sided pneumonia requiring ICU admission and intubation.  He recovered quickly and completed a 5-day course  of antibiotics by 4/5 and was released to the floor that same day.  The patient would likely be stable to discharge today or tomorrow with continued improvement and management of his diabetes.  Patient had a brief desaturation event while ambulating with OT on 4/5.  Plan: CAP with acute respiratory failure and hypoxia: Etiology not determined as cultures were negative.  Patient completed a 5-day course of broad-spectrum antibiotics.  He denies ongoing symptoms and has marked  improvement in his white blood cell count with recent high-dose steroid use. -Resolving -Continue PT/OT, appreciate their recommendations and assistance -We will need outpatient follow-up  Mobid obesity: Patient has a history of morbid obesity with BMI of 62.  He request nutrition consult. -Nutritional consult placed  Diabetes with elevated glucose: Likely type II but warrants outpatient evaluation if not yet completed.  Patient with a history of prediabetes in the setting of underlying morbid obesity and now high-dose stress steroids during his admission.  CBGs show marked improvement for initiation of insulin and cessation of steroids.  We will slowly taper the insulin today and he may be able to discontinue this prior to discharge as his hemoglobin A1c was 7.1 on admission. -Recommend Metformin on discharge -Lantus decreased to 10 units daily -Decrease SSI insulin to moderate scale -Continue to monitor for 24 hours  Acute renal injury: Resolved.  The patient's serum creatinine initially peaked at 2.25 but now down to less than 1 x 2 days.  Likely secondary to sepsis. -Recommend repeat BMP in 1 to 2 weeks as an outpatient  HTN: The patient's blood pressures remain elevated over the past 24/48 hours.  He will continue to benefit from appropriate adjustment of antihypertensives as an outpatient.  As such we will continue his home amlodipine 10 mg daily and as needed hydralazine while restarting his hydrochlorothiazide.  Consideration was given to an ACE/ARB but given his hyperkalemia on admission we will avoid this at this time.  He has a history of both hypo and hyperkalemia. -Continue amlodipine, restart hydrochlorothiazide, consider ACE ARB as an outpatient with BMP 1 to 2 weeks -Hydralazine as needed  Hypothyroidism: No obvious sequela of hypo-/hyperthyroidism.  Continue home Synthroid  FEN/GI -Diet: Carb mod -Fluids: n/a DVT prophylaxis: Enoxaparin CODE:  Full Lines/tubes: Dispo: Anticipated discharge in approximately 0-1 day(s).   Kathi Ludwig, MD [facility] Internal Medicine, PGY-3  Please see Attending A/P and/or Addendum for final recommendations.

## 2019-12-15 NOTE — Progress Notes (Signed)
Nutrition Follow-up  DOCUMENTATION CODES:   Morbid obesity  INTERVENTION:   -Continue Carb Modified diet -Provided "General Healthful Nutrition Therapy" handout from [facility] Care Manual; attached to AVS/ discharge insructions  NUTRITION DIAGNOSIS:   Inadequate oral intake related to inability to eat as evidenced by NPO status.  Resolved; on carb modified diet  GOAL:   Patient will meet greater than or equal to 90% of their needs  Progressing  MONITOR:   PO intake, Labs, Weight trends, Skin, I & O's  REASON FOR ASSESSMENT:   Consult Diet education  ASSESSMENT:   19 yo male admitted with SOB and chest pain. Required intubation on the early morning of 4/2. PMH includes congenital pulmonary stenosis, COVID-19, ADHD, hypothyroidism, emphysema, asthma.  4/4- extubated 4/5- s/p BSE- advanced to regular diet with thin liquids  Reviewed I/O's: 0 ml x 24 hours and -2.8 L since admission  UOP: 1.3 L x 24 hours  Attempted to speak with pt x 2, however, pt either working with physical therapy or sleeping soundly and did not arouse for education.   Obesity is a complex, chronic medical condition that is optimally managed by a multidisciplinary care team. Weight loss is not an ideal goal for an acute inpatient hospitalization. However, if further work-up for obesity is warranted, consider outpatient referral to outpatient bariatric service and/or [facility].   Per MD notes, possible discharge home tomorrow.   Lab Results  Component Value Date   HGBA1C 7.1 (H) 12/11/2019   PTA DM medications are none.   Labs reviewed: CBGS: 89-136 (inpatient orders for glycemic control are 3-9 units insulin aspart every 4 hours and 30 units insulin glargine q HS).   Diet Order:   Diet Order            Diet Carb Modified Fluid consistency: Thin; Room service appropriate? Yes  Diet effective now              EDUCATION NEEDS:   Not  appropriate for education at this time  Skin:  Skin Assessment: Reviewed RN Assessment  Last BM:  12/14/19  Height:   Ht Readings from Last 1 Encounters:  12/11/19 6\' 1"  (1.854 m)    Weight:   Wt Readings from Last 1 Encounters:  12/15/19 (!) 212.1 kg    Ideal Body Weight:  83.6 kg  BMI:  Body mass index is 61.68 kg/m.  Estimated Nutritional Needs:   Kcal:  1850-2050  Protein:  115-130 grams  Fluid:  1.8-2.0 L    02/14/20, RD, LDN, CDCES Registered Dietitian II Certified Diabetes Care and Education Specialist Please refer to [facility] for RD and/or RD on-call/weekend/after hours pager

## 2019-12-15 NOTE — Progress Notes (Signed)
Physical Therapy Note  SATURATION QUALIFICATIONS: (This note is used to comply with regulatory documentation for home oxygen)  Patient Saturations on Room Air at Rest = 98%  Patient Saturations on Room Air while Ambulating = 86%  Patient Saturations on 2 Liters of oxygen while Ambulating = 98%  Please briefly explain why patient needs home oxygen: Patient requires supplemental oxygen to maintain oxygen saturations at acceptable, safe levels with physical activity.  Van Clines, [place]  Acute Rehabilitation Services Pager [phone] Office [phone]

## 2019-12-15 NOTE — Progress Notes (Signed)
Physical Therapy Treatment Patient Details Name: Benjamin Rosario MRN: [id number] DOB: 2001/04/07 Today's Date: 12/15/2019    History of Present Illness Pt adm with acute respiratory failure due to bacterial PNA. Pt intubated 4/2-4/4. PMH - OSA/OHS, congenital pulmonary stenosis and abnormal right lung perfusion, chronic unilateral right paraseptal emphysema, HTN, DM2, covid PNA 1/21, morbid obesity    PT Comments    Continuing work on functional mobility and activity tolerance;  Walked in the hallway today without assistive device with little difficulty, cues to sefl-monitor for activity tolerance; Able to initiate stair negotiation assessment (lives in 3rd floor walk up apartment); Overall managed 1 flight of steps today well -- steady with use of rail; noting DOE post flight  On Room Air, and O2 sats had fallen to 86%; came back quickly with supplemental O2 to >93%; We discussed taking stairs slowly, and stopping for rest breaks when he feels winded.   Follow Up Recommendations  No PT follow up;Supervision - Intermittent     Equipment Recommendations  Other (comment)(Could use supplemental O2 for going up stairs)    Recommendations for Other Services       Precautions / Restrictions Precautions Precautions: Other (comment) Precaution Comments: DOE and desat with stairs Restrictions Weight Bearing Restrictions: No    Mobility  Bed Mobility Overal bed mobility: Independent                Transfers Overall transfer level: Needs assistance Equipment used: None Transfers: Sit to/from Stand Sit to Stand: Supervision         General transfer comment: no gross difficulty noted  Ambulation/Gait Ambulation/Gait assistance: Supervision;+2 safety/equipment Gait Distance (Feet): 120 Feet Assistive device: None Gait Pattern/deviations: Step-through pattern;Decreased stride length;Wide base of support Gait velocity: decr   General Gait Details: Much improved from yesterday;  Overall steady with above gait deviations; no assistive device needed; cues to self monitor for activity tolerance; reported feeling "winded" after stairs; O2 sats decr as low as 86% with walk back to room on room air   Stairs Stairs: Yes Stairs assistance: Min guard Stair Management: One rail Right;Alternating pattern;Forwards Number of Stairs: 12 General stair comments: DOE with stair negotiation on Room Water quality scientist [place] (Stroke Patients Only)       Balance                                            Cognition Arousal/Alertness: Awake/alert Behavior During Therapy: WFL for tasks assessed/performed Overall Cognitive Status: Within Functional Limits for tasks assessed                                        Exercises      General Comments General comments (skin integrity, edema, etc.): See other note of this date for O2 sats      Pertinent Vitals/Pain Pain Assessment: No/denies pain    Home Living                      Prior Function            PT Goals (current goals can now be found in the care plan section) Acute Rehab PT Goals Patient Stated Goal: return home PT Goal Formulation: With patient Time For Goal Achievement:  12/21/19 Potential to Achieve Goals: Good Progress towards PT goals: Progressing toward goals(Very nice progress)    Frequency    Min 3X/week      PT Plan Current plan remains appropriate    Co-evaluation              AM-PAC PT "6 Clicks" Mobility   Outcome Measure  Help needed turning from your back to your side while in a flat bed without using bedrails?: None Help needed moving from lying on your back to sitting on the side of a flat bed without using bedrails?: None Help needed moving to and from a bed to a chair (including a wheelchair)?: None Help needed standing up from a chair using your arms (e.g., wheelchair or bedside chair)?: None Help needed  to walk in hospital room?: None Help needed climbing 3-5 steps with a railing? : A Little 6 Click Score: 23    End of Session Equipment Utilized During Treatment: Oxygen Activity Tolerance: Patient tolerated treatment well Patient left: in chair;with call bell/phone within reach Nurse Communication: Mobility status PT Visit Diagnosis: Unsteadiness on feet (R26.81);Muscle weakness (generalized) (M62.81)     Time: 0936-1000 PT Time Calculation (min) (ACUTE ONLY): 24 min  Charges:  $Gait Training: 23-37 mins                     Van Clines, [place]  Acute Rehabilitation Services Pager [phone] Office [phone]    Benjamin Rosario 12/15/2019, 10:36 AM

## 2019-12-15 NOTE — Discharge Instructions (Signed)
General, Healthful Nutrition Therapy   If you are interested in a following a general, healthful diet or if your registered dietitian nutritionist (RDN) or doctor has recommended it to you, this guide can help provide you with the basic knowledge you'll need. The general healthful diet can be tailored to your personal preferences. There are several benefits to following a general, healthful diet: Depending on your food choices, it could mean less calories, less salt, less added sugars, and less saturated fat and trans fat than many other diets. When you focus on eating more whole grains, legumes, fruits, vegetables, nuts, and seeds, you may improve how much fiber, vitamins, and minerals you eat. It can lower your risk of conditions like diabetes, heart disease, hypertension, stroke, and cancer.  Tips Eat at least 5 servings of fruits and vegetables every day. Don't focus only on green vegetables. There are special health benefits to eating blue-purple, yellow, orange, and red vegetables. Eat more legumes (like beans and lentils) and more whole grains. Try meatless alternatives. In place of meat, you can get your protein from eating eggs, fish, poultry, beans, peas, soy-based foods, and nuts/nut butters Low-fat or fat-free dairy products are also good sources of protein. Keep your salt intake to a minimum (less than 2300 milligrams per day). Avoid adding salt, soy sauce or fish sauce to your food when cooking. Eat freshly prepared meals at home. Processed foods and restaurant foods contain more salt. Fresh fruits and vegetables are the best choices for snacks. When shopping, choose the products with lower sodium content. Limit your daily sugar intake. Sugar can be found in honey, syrups, jelly, fruit juice, and fruit juice concentrate. Limit sugar-sweetened beverages like soda pop and fruit juice, sugary snacks, and candy It's best to avoid products with added sugar, but if you do eat  them, read labels carefully so you know how much sugar is in each portion. It is better to eat unsaturated fats than saturated fats. Avoid trans fats as much as possible. Unsaturated fat is found in fish, avocado, nuts, and oils like sunflower, canola, and olive oils. Saturated fat is found in fatty meat, butter, ice cream, palm and coconut oil, cream, cheese, and lard. Trans fats are found in many processed foods, margarines, fried foods, fast food items, convenience foods like frozen pizza and snack foods, and sweets including pies, cookies, and other pastries. Check nutrition labels. When cooking, use vegetable oil instead of animal oil. Boil, steam, or bake your food instead of frying. If you eat meat, remove the fatty part before cooking.  Foods Recommended Include a variety of the following whole foods. Choose a healthful balance of foods from each category at your meals. Be sure the meals don't exceed your recommended calorie limit so you can achieve and/or maintain a healthy weight. Food Group Foods Recommended Grains Choose whole grains for at least half of grain selections, including whole wheat, barley, rye, buckwheat, corn, teff, quinoa, millet, amaranth, brown and wild rice, sorghum, and oats Focus on intact cooked whole grains Choose grain products, such as bread, rolls, prepared breakfast cereals, crackers, and pasta made from whole grains that are low in added sugars, saturated fat, and sodium  Protein Foods Fresh or frozen red meat, including lean, trimmed cuts of beef, pork, or lamb a few times per week or less; avoid processed meats, such as bacon, sausage, and ham Fresh or frozen poultry, including skinless chicken or [place], avoid processed meats that are higher in sodium Fresh, frozen, or  canned seafood, including fish, shrimp, lobster, clams, and scallops at least twice per week. Focus on fatty fish, such as salmon, herring, and sardines, as a rich source of  omega-3 fatty acids, and limit those which have a greater risk for contamination, including king mackerel, shark, and tilefish Eggs Nuts and seeds, such as peanuts, almonds, pistachios, and sunflower seeds (unsalted varieties) Nut and seed butters, such as peanut butter, almond butter, and sunflower seed butter, (reduced-sodium varieties) Soy foods, such as tofu, tempeh, or soy nuts Meat alternatives, such as veggie burgers, and sausages based on plant protein (reduced-sodium varieties) Unsalted legumes, such as dried beans, lentils, or peas at least a few times per week in place of other protein sources  Dairy Low-fat or fat-free milk, yogurt (low in added sugars), cottage cheese, and cheeses Frozen desserts made from low-fat milk that are low in added sugars (no more than 5 grams added sugars per serving) Fortified soymilk  Vegetables  A variety of fresh, frozen, and canned (unsalted) whole vegetables, including dark-green, red and orange vegetables, legumes (beans and peas), and starchy vegetables; low-sodium vegetable juices  Fruits A variety of fresh, frozen, canned and dried, whole unsweetened fruits canned fruit packed in water or fruit juice without added sugar) 100% fruit juice (limited to one serving per day)  Oils andFats Use in moderation, up to 5 servings per day: Unsaturated vegetable oils, including olive, peanut, and canola oils Margarines and spreads, which list liquid vegetable oil as the first ingredient and do not contain trans fats (partially hydrogenated oil) Salad dressing and mayonnaise made from unsaturated vegetable oils  Beverages Coffee, tea (unsweetened), water, 100% fruit juice (limited to one serving per day) Avoid sweetened beverages, including soda, sweetened tea, sports drinks, energy drinks, and coffee drinks Other Prepared foods, including soups, casseroles, salads, baked goods, and snacks made from recommended ingredients, with low levels of  added saturated fat, added sugars, or salt  Foods Not Recommended The following foods should be included occasionally, if at all. Food Group Foods Not Recommended  Grains  Sweetened, low-fiber breakfast cereals (less than 2 grams of fiber per serving) Packaged (high sugar, refined ingredients) baked goods Snack crackers and chips made of refined ingredients, cheese crackers, butter crackers Breads made with refined ingredients and saturated fats, such as biscuits, frozen waffles, sweet breads, doughnuts, pastries, packaged baking mixes, pancakes, cakes, and cookies  Protein Foods  Marbled or fatty red meats (beef, pork, lamb), such as ribs Processed red meats, such as bacon, sausage, and ham Poultry (chicken and [place]) with skin Fried meats, poultry, or fish Deli meats, such as pastrami, bologna, or salami (made of meat or poultry) Fried eggs Salted legumes, nuts, seeds, or nut/seed butters Meat alternatives with high levels of sodium or saturated fat  Dairy  Whole milk, cream, cheeses made from whole milk, sour cream Yogurt or ice cream made from whole milk or with added sugar Cream cheese made from whole milk  Vegetables  Canned or frozen vegetables with salt, fresh vegetables prepared with salt Fried vegetables Vegetables in cream sauce or cheese sauce Tomato or pasta sauce with high levels of salt or sugar  Fruits  Fruits packed in syrup or made with added sugar Copyright Academy of Nutrition and Dietetics. This handout may be duplicated for client education. Page 4/5 Oils Solid shortening or partially hydrogenated oils Solid margarine made with hydrogenated or partially hydrogenated oils Margarine that contains trans fats; butter  Beverages  Sweetened drinks, including sweetened coffee or tea  drinks, soda, energy drinks, and sports drinks  Alcohol (for adults >28 years of age) If you choose to drink, women should have no more than one drink per day and men should  have no more than two per day (One drink is measured as 5 ounces wine; 12 ounces beer, 1.5 ounces spirits.) Other Sugary and/or fatty desserts, candy, and other sweets; salt and seasonings that contain salt Fried foods  Copyright Academy of Nutrition and Dietetics. This handout may be duplicated for client education.

## 2019-12-16 ENCOUNTER — Telehealth: Payer: Self-pay

## 2019-12-16 DIAGNOSIS — Q256 Stenosis of pulmonary artery: Secondary | ICD-10-CM

## 2019-12-16 DIAGNOSIS — Z9981 Dependence on supplemental oxygen: Secondary | ICD-10-CM

## 2019-12-16 DIAGNOSIS — Z79890 Hormone replacement therapy (postmenopausal): Secondary | ICD-10-CM

## 2019-12-16 DIAGNOSIS — E039 Hypothyroidism, unspecified: Secondary | ICD-10-CM

## 2019-12-16 DIAGNOSIS — I10 Essential (primary) hypertension: Secondary | ICD-10-CM

## 2019-12-16 DIAGNOSIS — Z8701 Personal history of pneumonia (recurrent): Secondary | ICD-10-CM

## 2019-12-16 DIAGNOSIS — E119 Type 2 diabetes mellitus without complications: Secondary | ICD-10-CM

## 2019-12-16 DIAGNOSIS — Z8616 Personal history of COVID-19: Secondary | ICD-10-CM

## 2019-12-16 DIAGNOSIS — J45909 Unspecified asthma, uncomplicated: Secondary | ICD-10-CM

## 2019-12-16 DIAGNOSIS — Z79899 Other long term (current) drug therapy: Secondary | ICD-10-CM

## 2019-12-16 DIAGNOSIS — Z6841 Body Mass Index (BMI) 40.0 and over, adult: Secondary | ICD-10-CM

## 2019-12-16 LAB — CBC
HCT: 47.8 % (ref 39.0–52.0)
Hemoglobin: 14.6 g/dL (ref 13.0–17.0)
MCH: 24.6 pg — ABNORMAL LOW (ref 26.0–34.0)
MCHC: 30.5 g/dL (ref 30.0–36.0)
MCV: 80.5 fL (ref 80.0–100.0)
Platelets: 400 10*3/uL (ref 150–400)
RBC: 5.94 MIL/uL — ABNORMAL HIGH (ref 4.22–5.81)
RDW: 17 % — ABNORMAL HIGH (ref 11.5–15.5)
WBC: 10.5 10*3/uL (ref 4.0–10.5)
nRBC: 0 % (ref 0.0–0.2)

## 2019-12-16 LAB — GLUCOSE, CAPILLARY
Glucose-Capillary: 91 mg/dL (ref 70–99)
Glucose-Capillary: 76 mg/dL (ref 70–99)

## 2019-12-16 MED ORDER — HYDROCHLOROTHIAZIDE 12.5 MG PO CAPS: 12.5000 mg | ORAL_CAPSULE | Freq: Every day | ORAL | 1 refills | Status: DC

## 2019-12-16 MED ORDER — CARVEDILOL 6.25 MG PO TABS: 6.2500 mg | ORAL_TABLET | Freq: Two times a day (BID) | ORAL | 1 refills | Status: AC

## 2019-12-16 MED ORDER — IPRATROPIUM-ALBUTEROL 0.5-2.5 (3) MG/3ML IN SOLN: 3.0000 mL | Freq: Two times a day (BID) | RESPIRATORY_TRACT | Status: DC

## 2019-12-16 MED ORDER — LEVOTHYROXINE SODIUM 75 MCG PO TABS: 75.0000 ug | ORAL_TABLET | Freq: Every day | ORAL | 3 refills | Status: AC

## 2019-12-16 MED ORDER — METFORMIN HCL 500 MG PO TABS: 500.0000 mg | ORAL_TABLET | Freq: Two times a day (BID) | ORAL | 1 refills | Status: DC

## 2019-12-16 MED FILL — HYDROCHLOROTHIAZIDE 12.5 MG: 12.5 | 30 days supply | Qty: 30 | Fill #0

## 2019-12-16 MED FILL — metFORMIN HCL 500 MG TABS: 500 | 30 days supply | Qty: 60 | Fill #0

## 2019-12-16 MED FILL — LEVOTHYROXINE 75 MCG TABLET: 75 | 30 days supply | Qty: 30 | Fill #0

## 2019-12-16 MED FILL — CARVEDILOL 6.25 MG TABLET: 6.25 | 30 days supply | Qty: 60 | Fill #0

## 2019-12-16 NOTE — Telephone Encounter (Signed)
Hospital fu visit per dr Mcarthur Rossetti, appt 12/23/2019.

## 2019-12-16 NOTE — Progress Notes (Signed)
Subjective:   Pt was seen at the bedside today. He states he is ready to get out of the hospital. Discussed following up with a PCP - patient would like to follow up with internal medicine clinic vs [place]. Patient does not express any acute concerns today.    Objective: CBC Latest Ref Rng & Units 12/15/2019 12/14/2019 12/13/2019  WBC 4.0 - 10.5 K/uL 16.8(H) 20.2(H) 17.2(H)  Hemoglobin 13.0 - 17.0 g/dL 75.9 16.3 84.6  Hematocrit 39.0 - 52.0 % 48.2 46.4 45.4  Platelets 150 - 400 K/uL 408(H) 419(H) 408(H)   BMP Latest Ref Rng & Units 12/15/2019 12/14/2019 12/13/2019  Glucose 70 - 99 mg/dL 95 659(D) 357(S)  BUN 6 - 20 mg/dL 17(B) 93(J) 03(E)  Creatinine 0.61 - 1.24 mg/dL 0.92 3.30 0.76(A)  Sodium 135 - 145 mmol/L 140 143 140  Potassium 3.5 - 5.1 mmol/L 3.9 3.9 4.8  Chloride 98 - 111 mmol/L 104 105 105  CO2 22 - 32 mmol/L 26 25 23   Calcium 8.9 - 10.3 mg/dL ) 2.6(J) 9.1   Vital signs in last 24 hours: Vitals:   12/15/19 1953 12/15/19 2321 12/16/19 0117 12/16/19 0620  BP:   (!) 150/83 (!) 154/92  Pulse:  89 86 86  Resp:  16 17 18   Temp:   97.9 F (36.6 C) 97.7 F (36.5 C)  TempSrc:   Oral Oral  SpO2: 94% 97% 92% 92%  Weight:    (!) 209 kg  Height:       Physical Exam General: Resting in bed comfortably HEENT: NCAT CV: Distant heart sounds due to body habitus, normal S1-S2, no murmurs rubs or gallops appreciated PULM: Distant lung sounds due to body habitus but clear to auscultation bilaterally ABD: Soft and nontender NEURO: Alert and oriented, no focal deficits  Assessment/Plan:  Principal Problem:   Acute respiratory failure with hypoxia (HCC) Active Problems:   Hypothyroidism   Community acquired pneumonia   Morbid obesity with BMI of 50.0-59.9, adult (HCC)   Elevated troponin  In summary, Holzhauer is a 19 yo M w/ a PMHx notable for congenital pulmonary stenosis, Covid pneumonia 2019, hypertension, hypothyroidism, morbid obesity, prediabetes and asthma who initially  presented to the emergency department for worsening shortness of breath and productive cough of yellowish phlegm. Found to have left-sided pneumonia requiring ICU admission and intubation.  He recovered quickly and completed a 5-day course of antibiotics by 4/5 and was released to the floor.   Patient had a brief desaturation event while ambulating with OT on 4/5 while on room air.  #[place] Obesity Hypoventilation Syndorme #CAP with acute respiratory failure and hypoxia: BMI of 62 likely contributing to hypoxia. Etiology not determined as cultures were negative.  Patient completed a 5-day course of broad-spectrum antibiotics.  He denies ongoing symptoms and has marked improvement in his white blood cell count with recent high-dose steroid use. Pt ambulated with PT and had desaturations 86% on room air. Increased to 93% on 2L O2. -Hypoxia on exertion, will order DME supplemental O2 -PT/OT no follow up   #T2DM: Likely type II but warrants outpatient evaluation if not yet completed.  Patient with a history of prediabetes in the setting of underlying morbid obesity and now high-dose stress steroids during his admission.  CBGs show marked improvement for initiation of insulin and cessation of steroids.  We will slowly taper the insulin today and he may be able to discontinue this prior to discharge as his hemoglobin A1c was 7.1 on admission. -Metformin  500 mg BID upon d/c -Lantus decreased to 10 units daily -SSI insulin  #AKI: Resolved. The patient's serum creatinine initially peaked at 2.25 but now down to less than 1 x 2 days.  Likely secondary to sepsis. -Recommend repeat BMP in 1 to 2 weeks as an outpatient  HTN:: The patient's blood pressures remain elevated over the past 24/48 hours.  He will continue to benefit from appropriate adjustment of antihypertensives as an outpatient.  As such we will continue his home amlodipine 10 mg daily and as needed hydralazine while restarting his  hydrochlorothiazide.  Consideration was given to an ACE/ARB but given his hyperkalemia on admission we will avoid this at this time.  He has a history of both hypo and hyperkalemia. -Continue amlodipine, restart hydrochlorothiazide, consider ACE ARB as an outpatient with BMP 1 to 2 weeks -Hydralazine as needed  Hypothyroidism: No obvious sequela of hypo-/hyperthyroidism.  Continue home Synthroid  FEN/GI -Diet: Carb mod -Fluids: n/a  #DVT prophylaxis -Lovenox subq  #CODE STATUS: FULL  #Dispo: Anticipated d/c home today Prior to Admission Living Arrangement: Home Anticipated Discharge Location: Home Barriers to Discharge: None  Earlene Plater, MD Internal Medicine, PGY1 Pager: [phone]  12/16/2019,6:41 AM

## 2019-12-16 NOTE — TOC Initial Note (Addendum)
Transition of Care [facility]) - Initial/Assessment Note    Patient Details  Name: Benjamin Rosario MRN: [id number] Date of Birth: 01-Oct-2000  Transition of Care [facility]) CM/SW Contact:    Benjamin Plan, RN Phone Number: 12/16/2019, 1:17 PM  Clinical Narrative:                  Patient from home with girl friend. Clarified with Benjamin Rosario OT patient does not need HHOT. Patient already has home oxygen through Benjamin Rosario but needs portable oxygen tank to get home.   Spoke with Benjamin Rosario with Benjamin Rosario. Patient owes $600 plus and will need to set up a payment Rosario prior to Benjamin providing home portable tank. Benjamin will call patient.   Per Benjamin : Benjamin called patient he said call mom, they did she will not answer or return call, but they will go ahead and give oxygen tank   Expected Discharge Rosario: Home/Self Care Barriers to Discharge: Other (comment)(arranging portable oxygen tank, patient owes Benjamin Rosario over $600)   Patient Goals and CMS Choice Patient states their goals for this hospitalization and ongoing recovery are:: to go home CMS Medicare.gov Compare Post Acute Care list provided to:: Patient Choice offered to / list presented to : Patient  Expected Discharge Rosario and Services Expected Discharge Rosario: Home/Self Care   Discharge Planning Services: CM Consult Post Acute Care Choice: NA Living arrangements for the past 2 months: Apartment Expected Discharge Date: 12/16/19                 DME Agency: Benjamin Rosario Date DME Agency Contacted: 12/16/19 Time DME Agency Contacted: 0900 Representative spoke with at DME Agency: Benjamin Rosario HH Arranged: NA          Prior Living Arrangements/Services Living arrangements for the past 2 months: Apartment Lives with:: Significant Other Patient language and need for interpreter reviewed:: Yes Do you feel safe going back to the place where you live?: Yes      Need for Family Participation in Patient Care: Yes (Comment) Care giver support system  in place?: Yes (comment) Current home services: DME Criminal Activity/Legal Involvement Pertinent to Current Situation/Hospitalization: No - Comment as needed  Activities of Daily Living Home Assistive Devices/Equipment: None ADL Screening (condition at time of admission) Is the patient deaf or have difficulty hearing?: No Does the patient have difficulty seeing, even when wearing glasses/contacts?: No Does the patient have difficulty concentrating, remembering, or making decisions?: No Does the patient have difficulty dressing or bathing?: No Does the patient have difficulty walking or climbing stairs?: Yes  Permission Sought/Granted   Permission granted to share information with : No              Emotional Assessment Appearance:: Appears stated age Attitude/Demeanor/Rapport: Engaged Affect (typically observed): Accepting Orientation: : Oriented to Self, Oriented to Place, Oriented to  Time, Oriented to Situation Alcohol / Substance Use: Not Applicable Psych Involvement: No (comment)  Admission diagnosis:  SOB (shortness of breath) [R06.02] Elevated troponin [R77.8] Acute respiratory failure with hypoxia (HCC) [J96.01] Multifocal pneumonia [J18.9] Patient Active Problem List   Diagnosis Date Noted  . Elevated troponin 12/10/2019  . Pneumonia due to COVID-19 virus 09/19/2019  . Chronic respiratory failure with hypoxia (HCC)   . Acute respiratory failure with hypoxia (HCC) 06/10/2019  . Hypokalemia 06/10/2019  . Morbid obesity with BMI of 50.0-59.9, adult (HCC) 06/10/2019  . Acute respiratory failure with hypoxemia (HCC) 06/07/2019  . Community acquired pneumonia 06/07/2019  . Hypothyroidism 11/25/2018  .  Chlamydia 11/25/2018  . SOB (shortness of breath) 11/25/2018  . Chest pain 11/25/2018  . Emphysema lung ([place]) 11/25/2018  . Concussion with no loss of consciousness 07/27/2015  . ADHD (attention deficit hyperactivity disorder), combined type 06/07/2013  . MDD (major  depressive disorder), single episode, moderate ([place]) 06/06/2013  . Mixed hyperlipidemia 02/26/2011  . Obesity 02/26/2011  . Pre-diabetes 02/26/2011  . Other specified acquired hypothyroidism 02/26/2011   PCP:  Benjamin Limbo, MD Pharmacy:   [id number] - [place], [place] [address] [place] [address] Phone: [phone] Fax: [place], [place] - [address] [place] [address] Phone: [phone] Fax: [phone]     Social Determinants of Rosario (SDOH) Interventions    Readmission Risk Interventions No flowsheet data found.

## 2019-12-16 NOTE — Progress Notes (Signed)
OT Cancellation Note  Patient Details Name: Benjamin Rosario MRN: [id number] DOB: 01/27/2001   Cancelled Treatment:     Pt ambulating and completing ADL independently. Appears close to baseline. No further OT needed at this time.   Thornell Mule, OT/L   Acute OT Clinical Specialist Acute Rehabilitation Services Pager [phone] Office [phone]  12/16/2019, 2:48 PM

## 2019-12-16 NOTE — Progress Notes (Signed)
Pt given discharge summary and discharged home via family member. Oxygen tank delivered to pt's room.

## 2019-12-17 NOTE — Discharge Summary (Signed)
Name: Benjamin Rosario MRN: [id number] DOB: [birth year] 18 y.o. PCP: Soundra Pilon, FNP  Date of Admission: 12/10/2019  3:38 PM Date of Discharge: 12/16/2019 Attending Physician: Reymundo Poll, MD  Discharge Diagnosis: 1. CAP with acute hypoxic respiratory failure 2. T2DM 3. HTN  Discharge Medications: Allergies as of 12/16/2019   No Known Allergies     Medication List    STOP taking these medications   predniSONE 10 MG (21) Tbpk tablet Commonly known as: STERAPRED UNI-PAK 21 TAB     TAKE these medications   albuterol 108 (90 Base) MCG/ACT inhaler Commonly known as: VENTOLIN HFA Inhale 2 puffs into the lungs every 6 (six) hours as needed for wheezing or shortness of breath.   amLODipine 10 MG tablet Commonly known as: NORVASC Take 1 tablet (10 mg total) by mouth daily. Notes to patient: 12/17/19   carvedilol 6.25 MG tablet Commonly known as: COREG Take 1 tablet (6.25 mg total) by mouth 2 (two) times daily with a meal. Notes to patient: 12/16/19   hydrochlorothiazide 12.5 MG capsule Commonly known as: MICROZIDE Take 1 capsule (12.5 mg total) by mouth daily. Notes to patient: 12/17/19   levothyroxine 75 MCG tablet Commonly known as: SYNTHROID Take 1 tablet (75 mcg total) by mouth daily before breakfast. Notes to patient: 12/17/19   metFORMIN 500 MG tablet Commonly known as: Glucophage Take 1 tablet (500 mg total) by mouth 2 (two) times daily with a meal. Notes to patient: Continue home schedule   mometasone-formoterol 100-5 MCG/ACT Aero Commonly known as: DULERA Inhale 2 puffs into the lungs 2 (two) times daily. Notes to patient: Continue home schedule      Disposition and follow-up:   [name] was [facility] from [facility] in Stable condition.  At the hospital follow up visit please address:  1. CAP with acute hypoxic respiratory failure: Presented on 4/1 and was admitted to ICU after intubation.  Received 5 days of broad spectrum  antibiotics and steroids. Extubated successfully on 4/5 then discharged with supplemental O2. -Likely has obesity hypoventilation syndrome, would benefit from sleep study  2. T2DM: A1c elevated to 7.1.  Never been worked up for T1DM, and given his age may be worthwhile.  However still suspect type II given his weight. -Metformin 500 mg twice daily -f/u with PCP  3. HTN: Takes amlodipine 10 mg daily and HCTZ 12.5 mg daily -Consider starting ACE or ARB in the outpatient setting. May get BMP   4.  Labs / imaging needed at time of follow-up: BMP in 1-2 weeks  5.  Pending labs/ test needing follow-up: None  Follow-up Appointments: Follow-up Information    [facility]. Schedule an appointment as soon as possible for a visit on 12/23/2019.   Why: at 10:15AM Contact information: [address] [place] [address] [phone]         Hospital Course by problem list:  In summary, Benjamin Rosario is a 19 yo M w/ a PMHx notable forcongenital pulmonary stenosis, Covid pneumonia 2019, hypertension, hypothyroidism, morbid obesity, prediabetes and asthma who initially presented to the emergency department for worsening shortness of breath and productive cough of yellowish phlegm. Found to have left-sided pneumonia requiring ICU admission and intubation. He recovered quickly and completed a 5-day course of antibiotics and steroids by 4/5and was released to the floor.   1. CAP with acute hypoxic respiratory failure: Patient presented on 4/1 with acute hypoxic respiratory failure and had imaging findings consistent with multifocal  pneumonia.  He was initiated on broad-spectrum antibiotics and steroids and was intubated, then subsequently extubated on 4/4 then transitioned to the floor after completing 5 days of antibiotics.  She is saturating well on room air, but desaturated upon ambulation with PT. -Discharged with 2 L supplemental O2 -Likely has obesity  hypoventilation syndrome with a BMI >60.  Would benefit from sleep study  2. T2DM: A1c elevated to 7.1.  Given his young age, type 1 diabetes should be ruled out but with his weight I suspect type 2 diabetes. -Prescribed Metformin 50mg  BID -f/u with PCP  3. HTN: Patient was hypertensive during hospitalization. Originally on amlodipine 10 mg daily and HCTZ 12.5 mg daily. Encouraged to f/u with PCP.  Discharge Vitals:   BP (!) 144/58 (BP Location: Left Arm)   Pulse 86   Temp 98.4 F (36.9 C) (Oral)   Resp 18   Ht 6\' 1"  (1.854 m)   Wt (!) 209 kg   SpO2 91%   BMI 60.80 kg/m   Pertinent Labs, Studies, and Procedures:  CBC Latest Ref Rng & Units 12/16/2019 12/15/2019 12/14/2019  WBC 4.0 - 10.5 K/uL 10.5 16.8(H) 20.2(H)  Hemoglobin 13.0 - 17.0 g/dL 14.6 14.8 14.2  Hematocrit 39.0 - 52.0 % 47.8 48.2 46.4  Platelets 150 - 400 K/uL 400 408(H) 419(H)   BMP Latest Ref Rng & Units 12/15/2019 12/14/2019 12/13/2019  Glucose 70 - 99 mg/dL 95 178(H) 295(H)  BUN 6 - 20 mg/dL 28(H) 32(H) 45(H)  Creatinine 0.61 - 1.24 mg/dL 0.90 0.99 1.37(H)  Sodium 135 - 145 mmol/L 140 143 140  Potassium 3.5 - 5.1 mmol/L 3.9 3.9 4.8  Chloride 98 - 111 mmol/L 104 105 105  CO2 22 - 32 mmol/L 26 25 23   Calcium 8.9 - 10.3 mg/dL 8.6(L) 8.8(L) 9.1   Hgb A1c = 7.1  CT Angio Chest: IMPRESSION: 1. Evaluation for pulmonary emboli is limited as detailed above. Given these limitations, no PE was identified. 2. 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 3. 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 4. Scattered pulmonary opacities are again noted and are consistent with multifocal pneumonia. Recurrent pulmonary infection can be seen in patients with congenital interruption of a pulmonary artery.  CXR: IMPRESSION: Scarring right base. No edema or consolidation. Stable  cardiac enlargement. No adenopathy.  Discharge Instructions: Discharge Instructions    Call MD for:  difficulty breathing, headache or visual disturbances   Complete by: As directed    Call MD for:  extreme fatigue   Complete by: As directed    Call MD for:  hives   Complete by: As directed    Call MD for:  persistant dizziness or light-headedness   Complete by: As directed    Call MD for:  persistant nausea and vomiting   Complete by: As directed    Call MD for:  severe uncontrolled pain   Complete by: As directed    Call MD for:  temperature >100.4   Complete by: As directed    Diet - low sodium heart healthy   Complete by: As directed    Discharge instructions   Complete by: As directed    Thank you for letting us take care of you during your hospitalization.  Below is a summary of what was treated:  1.  Pneumonia -We treated you with IV antibiotics for your pneumonia.  Your respiratory status has  improved dramatically, but you still need a little supplemental oxygen 1 year walking around.  -When you get a chance to follow-up with your primary doctor, ask about sleep study.  They may be able to update your CPAP machine to help you breathe at night. -We are prescribing you 2 L of supplemental oxygen to use while you are walking  2.  Diabetes -Your blood sugars were elevated during your hospitalization.  This could be a side effect of the steroids he received, but we want these to be under control. -We prescribed a medication called Metformin that you should take twice a day. -Please follow-up your blood sugars with your primary care provider as soon as possible  3.  Hypertension -Start taking carvedilol 6.25 mg twice daily.  You can continue taking your home blood pressure medications as well.  You can follow this up with your primary care provider.  4.  Follow-up -Please schedule a follow-up appointment with your primary care provider soon as possible so they can follow  everything up that was done in the hospital.   Increase activity slowly   Complete by: As directed    Increase activity slowly   Complete by: As directed      Signed: Kirt Boys, MD Internal Medicine, PGY1 Pager: [phone]  12/17/2019,5:52 PM

## 2019-12-18 NOTE — Telephone Encounter (Signed)
Transition Care Management Follow-up Telephone Call  Date of discharge and from where: 12/16/19  How have you been since you were released from the hospital? Doing better  Any questions or concerns? No   Items Reviewed:  Did the pt receive and understand the discharge instructions provided? Yes   Medications obtained and verified? Yes   Any new allergies since your discharge? No   Dietary orders reviewed? Yes, states "low Na diet, plans to see nutritionist"  Do you have support at home? Yes   Functional Questionnaire: (I = Independent and D = Dependent) ADLs: I  Bathing/Dressing- I  Meal Prep- I  Eating- I  Maintaining continence- I  Transferring/Ambulation- I  Managing Meds- I  Follow up appointments reviewed:   PCP Hospital f/u appt confirmed? Yes  Scheduled to see [facility] on 12/23/19 @ 1015.  Specialist Hospital f/u appt confirmed? No  Scheduled to see N/A on.  Are transportation arrangements needed? No   If their condition worsens, is the pt aware to call PCP or go to the Emergency Dept.? YES  Was the patient provided with contact information for the PCP's office or ED? Yes  Was to pt encouraged to call back with questions or concerns? Yes

## 2019-12-23 ENCOUNTER — Ambulatory Visit (INDEPENDENT_AMBULATORY_CARE_PROVIDER_SITE_OTHER): Payer: 59 | Admitting: Internal Medicine

## 2019-12-23 ENCOUNTER — Encounter: Payer: Self-pay | Admitting: Internal Medicine

## 2019-12-23 VITALS — BP 151/89 | HR 91 | Temp 98.2°F | Ht 73.0 in | Wt >= 6400 oz

## 2019-12-23 DIAGNOSIS — Z794 Long term (current) use of insulin: Secondary | ICD-10-CM

## 2019-12-23 DIAGNOSIS — Z6841 Body Mass Index (BMI) 40.0 and over, adult: Secondary | ICD-10-CM

## 2019-12-23 DIAGNOSIS — J9601 Acute respiratory failure with hypoxia: Secondary | ICD-10-CM | POA: Diagnosis not present

## 2019-12-23 DIAGNOSIS — Z8701 Personal history of pneumonia (recurrent): Secondary | ICD-10-CM

## 2019-12-23 DIAGNOSIS — Z8709 Personal history of other diseases of the respiratory system: Secondary | ICD-10-CM

## 2019-12-23 DIAGNOSIS — Z7984 Long term (current) use of oral hypoglycemic drugs: Secondary | ICD-10-CM

## 2019-12-23 DIAGNOSIS — E119 Type 2 diabetes mellitus without complications: Secondary | ICD-10-CM | POA: Diagnosis not present

## 2019-12-23 DIAGNOSIS — I10 Essential (primary) hypertension: Secondary | ICD-10-CM | POA: Insufficient documentation

## 2019-12-23 DIAGNOSIS — Z79899 Other long term (current) drug therapy: Secondary | ICD-10-CM

## 2019-12-23 MED ORDER — LISINOPRIL 5 MG PO TABS: 5.0000 mg | ORAL_TABLET | Freq: Every day | ORAL | 0 refills | Status: AC

## 2019-12-23 NOTE — Assessment & Plan Note (Signed)
Pt endorses taking amlodipine 10mg  daily, carvedilol 6.25mg  BID, and HCTZ 12.5mg  daily. Denies medication side effects, chest pain, shortness of breath, leg swelling, or dizziness/lightheadedness. BP today remains elevated. BMP at discharge on 4/6 with Cr 0.9, K 3.9, and Na 140.  - start lisinopril 5mg  daily - continue amlodipine, carvedilol, and HCTZ as above - f/u in 1 month for BP check and BMP

## 2019-12-23 NOTE — Progress Notes (Signed)
   CC: hospital follow-up and to establish care  HPI:  [name] is a 19 y.o. F with significant PMH of morbid obesity, diabetes, hypertension, and recent hospitalization for CAP complicated by acute hypoxic respiratory failure requiring intubation and ICU stay, who presents for 1 week hospital follow-up. Please see problem-based charting for additional information.  Past Medical History:  Diagnosis Date  . ADHD (attention deficit hyperactivity disorder)   . Asthma   . Chlamydia 11/25/2018  . Emphysema of lung (HCC) 11/25/2018  . Environmental allergies   . Hypothyroidism 11/25/2018   Family History  Problem Relation Age of Onset  . Hypertension Father   . Diabetes Father    Social History   Tobacco Use  . Smoking status: Never Smoker  . Smokeless tobacco: Never Used  Substance Use Topics  . Alcohol use: No    Alcohol/week: 0.0 standard drinks  . Drug use: Yes    Types: Marijuana   Review of Systems:   Review of Systems  Constitutional: Positive for weight loss. Negative for chills, fever and malaise/fatigue.  Respiratory: Positive for shortness of breath (improving dyspnea on exertion). Negative for cough and wheezing.   Cardiovascular: Negative for chest pain, palpitations, orthopnea and leg swelling.  Gastrointestinal: Negative for abdominal pain, diarrhea, nausea and vomiting.   Physical Exam:  Vitals:   12/23/19 1045  BP: (!) 151/89  Pulse: 91  SpO2: 98%  Weight: (!) 443 lb 4.8 oz (201.1 kg)   Physical Exam Vitals and nursing note reviewed.  Constitutional:      General: He is not in acute distress.    Appearance: He is obese. He is not ill-appearing.  Cardiovascular:     Rate and Rhythm: Normal rate and regular rhythm.     Heart sounds: Normal heart sounds.  Pulmonary:     Effort: Pulmonary effort is normal. No respiratory distress.     Breath sounds: Normal breath sounds.     Comments: Breathing comfortably on room air. No shortness of breath on  ambulation back to exam room. Musculoskeletal:     Comments: Trace LE pitting edema.  Skin:    General: Skin is warm and dry.  Neurological:     Mental Status: He is alert.    Assessment & Plan:   See Encounters Tab for problem based charting.  Patient discussed with Dr. Mikey Bussing

## 2019-12-23 NOTE — Assessment & Plan Note (Signed)
Pt was hospitalized on 4/1 for acute hypoxic respiratory failure secondary to multifocal pneumonia requiring intubation and broad spectrum antibiotics/steriods for 5 days. Was extubated on 4/4 and discharged on 4/7. On discharge, he saturated well on room air but required 2L supplemental O2 with ambulation. On chart review, hospital ABGs showed hypercarbia (pCO2 87) and hypoxia (pO2 56), which improved as pt was on the ventilator. Does not appear that he had any awake ABGs confirming chronic hypercarbia and no bicarb elevation noted on BMPs.  Pt states he has been feeling much improved since discharge. Is able to walk up 2 flights of stairs before stopping for shortness of breath and utilizing supplemental oxygen to go up the 3rd flight before entering his home. Does not have a pulse oximeter at home to check his O2 saturation. Is wearing 2L [place] to sleep instead of his home CPAP, because the mask he has is uncomfortable. Of note, pt was scheduled to get a sleep study in the fall however was not able to due to repeat hospital admissions. He paid out of pocket to get a CPAP machine through a medical supply company and is unaware of his home settings. Endorsed only occasional CPAP use before this most recent hospitalization. Discussed the importance of completing a sleep study so that pt's OSA vs ?OHS can be treated with appropriate therapies if pt requires CPAP vs BiPAP.  - pt to follow-up with CPAP company today regarding getting new machine and a more comfortable mask - instructed pt to call pulmonologist to get sleep study scheduled

## 2019-12-23 NOTE — Patient Instructions (Addendum)
Benjamin Rosario,  It was nice meeting you today! I am glad you are feeling better since leaving the hospital. Continue to great work with walking, exercise, and weight loss.  For your blood pressure - please begin lisinopril 5mg  daily. Continue taking amlodipine and HCTZ daily. Follow-up with your primary provider in approximately 1 month for a BP check and blood work.  For your breathing - please call pulmonology to check on the status and scheduling of your sleep study. Continue to work with your CPAP company to trial better fitting masks.   For your diabetes - please begin to increase your metformin dosing per the handout given to you today. You may start taking 500mg  with breakfast and 1000mg  (or 2 tablets) with dinner.  Follow-up with our clinic in 1 month.  Thank you for letting be a part of your care!

## 2019-12-23 NOTE — Assessment & Plan Note (Addendum)
Pt's BMI 58 today. He is attempted to make lifestyle modifications with increasing exercise activity and walking since hospital discharge. Pt is encouraged by 17lb weight loss since hospital discharge 1 week ago. Discussed that weight discrepancy may also be attributed to hospital bed scales, but pt does note that he feels lighter.  - encouraged continued exercise modifications in addition to dietary changes for weight loss - given pt's morbid obesity and comorbid conditions (diabetes, HTN, and OSA), could also consider bariatric surgery referral in the future   Wt Readings from Last 3 Encounters:  12/23/19 (!) 443 lb 4.8 oz (201.1 kg)  12/16/19 (!) 460 lb 12.8 oz (209 kg)  09/20/19 (!) 448 lb 10.2 oz (203.5 kg)

## 2019-12-23 NOTE — Assessment & Plan Note (Addendum)
Pt with history of prediabetes, however A1c on hospital admission now elevated to 7.0 indicating diabetes diagnosis. Started on metformin 500mg  BID at discharge. Was previously on metformin which caused GI side effects, however pt states he is tolerating it well at this time. Denies nausea, vomiting, abdominal pain, or diarrhea.   Could consider work-up with GAD antibodies for type I diabetes given pt's age, though suspect pt's diabetes is type II given weight and only minimal elevation in A1c requiring no insulin. Will defer additional work-up to new PCP.  - pt given instructions for metformin titration, with end goal dose of 1,000mg  BID - can repeat A1c in 3 months

## 2019-12-25 NOTE — Progress Notes (Signed)
Internal Medicine Clinic Attending  Case discussed with Dr. Jones at the time of the visit.  We reviewed the resident's history and exam and pertinent patient test results.  I agree with the assessment, diagnosis, and plan of care documented in the resident's note.  

## 2019-12-31 ENCOUNTER — Ambulatory Visit (INDEPENDENT_AMBULATORY_CARE_PROVIDER_SITE_OTHER): Payer: 59 | Admitting: Pulmonary Disease

## 2019-12-31 ENCOUNTER — Other Ambulatory Visit: Payer: Self-pay

## 2019-12-31 ENCOUNTER — Encounter: Payer: Self-pay | Admitting: *Deleted

## 2019-12-31 ENCOUNTER — Encounter: Payer: Self-pay | Admitting: Pulmonary Disease

## 2019-12-31 ENCOUNTER — Other Ambulatory Visit: Payer: Self-pay | Admitting: *Deleted

## 2019-12-31 VITALS — BP 120/86 | HR 96 | Temp 97.0°F | Ht 73.0 in | Wt >= 6400 oz

## 2019-12-31 DIAGNOSIS — J430 Unilateral pulmonary emphysema [MacLeod's syndrome]: Secondary | ICD-10-CM | POA: Diagnosis not present

## 2019-12-31 DIAGNOSIS — Z6841 Body Mass Index (BMI) 40.0 and over, adult: Secondary | ICD-10-CM

## 2019-12-31 DIAGNOSIS — J1282 Pneumonia due to coronavirus disease 2019: Secondary | ICD-10-CM

## 2019-12-31 DIAGNOSIS — J9611 Chronic respiratory failure with hypoxia: Secondary | ICD-10-CM | POA: Diagnosis not present

## 2019-12-31 DIAGNOSIS — I272 Pulmonary hypertension, unspecified: Secondary | ICD-10-CM

## 2019-12-31 DIAGNOSIS — Z9189 Other specified personal risk factors, not elsewhere classified: Secondary | ICD-10-CM

## 2019-12-31 DIAGNOSIS — U071 COVID-19: Secondary | ICD-10-CM

## 2019-12-31 NOTE — Assessment & Plan Note (Signed)
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

## 2019-12-31 NOTE — Assessment & Plan Note (Signed)
Walk today in office, patient completed 2 laps, oxygen levels briefly went to 87%, then O2 levels corrected  Plan: We will bring patient back in 2 weeks for 6-minute walk to further evaluate oxygen needs Recommended continue CPAP use Recommended continue with inhaler use 4 to 6-week follow-up with our office

## 2019-12-31 NOTE — Progress Notes (Signed)
  ID: Benjamin Rosario, male    DOB: July 13, 2001, 18 y.o.   MRN: [id number]  Chief Complaint  Patient presents with  . Follow-up    Needs a new mask, very uncomfortable.  oxygen 3L at HS.    Referring provider: Albertha Ghee, MD  HPI:  19 year old male former smoker initially referred to our office in September/2020.  Patient was suspected obesity hypoventilation syndrome/obstructive sleep apnea.  Patient with significant emphysema on CT imaging.  PMH: Hypothyroidism, ADHD, Meningitis, hyper tension, morbid obesity, prediabetes, suspected asthma, congenital pulmonary stenosis Smoker/ Smoking History: Former smoker Maintenance:   Pt of: Dr. Craige Cotta  12/31/2019  - Visit   19 year old male former smoker presenting to our office today as a hospital follow-up.  Patient was last seen in our office as a consult in September/2020 with Dr. Craige Cotta.  At that time there was a high suspicion that patient may have obstructive sleep apnea.  A home sleep study was ordered.  Also high-resolution CT chest as well as pulmonary function test.  Home sleep study and pulmonary function test still need to be completed.  Patient sent back to our office after most recent hospitalization in April/2021 given patient's high risk nature for obstructive sleep apnea as well as obesity hypoventilation syndrome.  Since last being seen in September/2020 patient's work-up has been complicated given recurrent admissions.  He has been admitted to the hospital 4 times since then.  Also there have been difficulties with completing home sleep studies given patient's insurance coverage.  Hospital Course by problem list:  In summary,Benjamin Rosario is a 20 yo M w/ a PMHx notable forcongenital pulmonary stenosis, Covid pneumonia 2019, hypertension, hypothyroidism, morbid obesity, prediabetes and asthma who initially presented to the emergency department for worsening shortness of breath and productive cough of yellowish phlegm.Found to  haveleft-sided pneumonia requiring ICU admission and intubation. He recovered quickly and completed a 5-day course of antibiotics and steroids by 4/5and was released to the floor.  1. CAP with acute hypoxic respiratory failure: Patient presented on 4/1 with acute hypoxic respiratory failure and had imaging findings consistent with multifocal pneumonia.  He was initiated on broad-spectrum antibiotics and steroids and was intubated, then subsequently extubated on 4/4 then transitioned to the floor after completing 5 days of antibiotics.  She is saturating well on room air, but desaturated upon ambulation with PT. -Discharged with 2 L supplemental O2 -Likely has obesity hypoventilation syndrome with a BMI >60.  Would benefit from sleep study  2. T2DM: A1c elevated to 7.1.  Given his young age, type 1 diabetes should be ruled out but with his weight I suspect type 2 diabetes. -Prescribed Metformin  BID -f/u with PCP  3. HTN: Patient was hypertensive during hospitalization. Originally on amlodipine 10 mg daily and HCTZ 12.5 mg daily. Encouraged to f/u with PCP.  Patient has completed follow-up with primary care.  Patient is presenting today for further evaluation.  We will discuss his recent discharge.  Patient reports that he did receive his CPAP this is managed through [employer].  We do not have a compliance download on this.  We will request a copy today.  Patient reports that he received this without a sleep study.  He reports compliance to using the CPAP.  He does dislike using a fullface mask.  He is interested in finding other options available.  Questionaires / Pulmonary Flowsheets:   MMRC: mMRC Dyspnea Scale mMRC Score  12/31/2019 1    Epworth:  Results of the Epworth flowsheet  06/04/2019  Sitting and reading 2  Watching TV 3  Sitting, inactive in a public place (e.g. a theatre or a meeting) 1  As a passenger in a car for an hour without a break 0  Lying down to rest in the  afternoon when circumstances permit 2  Sitting and talking to someone 0  Sitting quietly after a lunch without alcohol 0  In a car, while stopped for a few minutes in traffic 0  Total score 8    Tests:   Pulmonary tests:  ABG 04/13/19 >> pH 7.35, PCO2 50.1, PO2 37.5  Chest imaging:  CT chest 11/25/18 >> paraseptal emphysema with early bullous changes and apical thickening, asymmetric peribronchovascular nodularity  06/11/2019-CT chest high-res-extensive heterogeneous airspace disease in the left lower lobe consistent with infection, there is moderate paraseptal and centrilobular emphysema predominantly involving the right lung and right upper lobe, unusually advancer patient age with significant volume loss of right lung, extensive bronchial wall thickening throughout right lung, 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, this consultation findings has been reported in the literature and may represent with recurrent infection and hemoptysis, emphysema, there are scattered small pulmonary nodules  12/10/2019-CTA chest-right lung demonstrate significant emphysematous changes and volume loss consistent with the absent pulmonary artery, these changes are stable from prior exam, left lung is well aerated and demonstrates patchy infiltrates throughout the left lung, this is consistent with that seen on a recent chest x-ray consistent with multifocal pneumonia, additionally there may be some sequela from patient's known COVID-19 infection Benjamin Rosario, somewhat limited exam for PE as described above  12/11/2019-echocardiogram-technically difficult study, vigorous LV systolic function, mild LVH, RV not well visualized but function appears to be reduced  FENO:  No results found for: NITRICOXIDE  PFT: No flowsheet data found.  WALK:  SIX MIN WALK 12/31/2019  Supplimental Oxygen during Test? (L/min) No  Tech Comments: Pt. walked at a good pace throughout walk without having to stop  for rest breaks.  He briefly dropped to 87% on RA at the end of the walk.  Took a few minutes to return to 98%.  Tolerated walk well.    Imaging: CT ANGIO CHEST PE W OR WO CONTRAST  Result Date: 12/10/2019 CLINICAL DATA:  Shortness of breath and chest pain, history of prior COVID-19 positivity EXAM: CT ANGIOGRAPHY CHEST WITH CONTRAST TECHNIQUE: Multidetector CT imaging of the chest was performed using the standard protocol during bolus administration of intravenous contrast. Multiplanar CT image reconstructions and MIPs were obtained to evaluate the vascular anatomy. CONTRAST:  24mL OMNIPAQUE IOHEXOL 350 MG/ML SOLN COMPARISON:  09/19/2019, chest x-ray from earlier in the same day. FINDINGS: Cardiovascular: Thoracic aorta shows a normal branching pattern. No aneurysmal dilatation or dissection is seen. No significant cardiac enlargement is noted. 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 The opacification of the pulmonary artery is somewhat limited secondary to the patient's body habitus and contrast timing bolus despite 2 attempts for optimum imaging. This is similar to that seen in January of 2021. No large central pulmonary embolus is seen. No pericardial effusion is noted. Mediastinum/Nodes: Thoracic inlet is within normal limits. Scattered small mediastinal lymph nodes are noted. These are likely reactive in nature. The esophagus as visualized is within normal limits. Lungs/Pleura: Right lung demonstrates significant emphysematous changes and volume loss consistent with the absent pulmonary artery. These changes are stable from the prior exam. The left lung is well  aerated and demonstrates patchy infiltrates throughout the left lung. This is consistent with that seen on recent chest x-ray consistent with multifocal pneumonia. Additionally there may be some sequelae from the patient's known COVID-19 infection. No sizable effusion is noted. Focal  pleural thickening is again noted in the left apex laterally stable dating back to 2020 and likely of a benign etiology. Upper Abdomen: Visualized upper abdomen shows no acute abnormality. Musculoskeletal: Bony structures are within normal limits. Review of the MIP images confirms the above findings. IMPRESSION: Somewhat limited evaluation for pulmonary embolism as described above. No large central embolus is seen. 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 Chronic changes in the right lung with volume loss and scarring as well as emphysematous changes consistent with the congenital abnormality of the right pulmonary artery. New multifocal infiltrate throughout the left lung similar to that seen on prior plain film examination. This likely represents a combination of sequelae from the prior COVID-19 infection as well as acute superimposed infiltrate given the acute history. Electronically Signed   By: Benjamin Rosario M.D.   On: 12/10/2019 22:23   DG CHEST PORT 1 VIEW  Result Date: 12/13/2019 CLINICAL DATA:  Follow-up exam.  Intubated patient. EXAM: PORTABLE CHEST 1 VIEW COMPARISON:  12/12/2019 and earlier exams. FINDINGS: Endotracheal tube and nasal/orogastric tube are stable and well positioned. Bilateral interstitial prominence. Hazy airspace opacities in the left mid to lower lung. These findings are stable. No new lung abnormalities. IMPRESSION: 1. No significant change from the previous day's study. 2. Persistent hazy left mid to lower lung opacities. 3. Stable well-positioned support apparatus. Electronically Signed   By: Lajean Manes M.D.   On: 12/13/2019 05:26   DG Chest Port 1 View  Result Date: 12/12/2019 CLINICAL DATA:  Check endotracheal tube placement EXAM: PORTABLE CHEST 1 VIEW COMPARISON:  12/11/2019 FINDINGS: Endotracheal tube is noted in satisfactory position 5 cm above the carina. Gastric catheter extends into the stomach. Cardiac shadow is stable.  Persistent but improved left sided parenchymal opacities are again noted. Right lung remains clear. No other focal abnormality is noted. IMPRESSION: Tubes and lines as described above. Improved aeration in the left lung. Electronically Signed   By: Benjamin Rosario M.D.   On: 12/12/2019 20:32   DG CHEST PORT 1 VIEW  Result Date: 12/11/2019 CLINICAL DATA:  Post intubation.  Orogastric tube placement EXAM: PORTABLE CHEST 1 VIEW COMPARISON:  Yesterday FINDINGS: Endotracheal tube tip is halfway between the clavicular heads and carina. The orogastric tube reaches the stomach. Cardiomegaly. Generalized left-sided airspace disease. Mild reticulation in the subpleural right lung where there is emphysematous spaces by CT. IMPRESSION: Stable hardware positioning and primarily left-sided pneumonia. Electronically Signed   By: Monte Fantasia M.D.   On: 12/11/2019 04:10   DG Chest Port 1 View  Result Date: 12/10/2019 CLINICAL DATA:  Shortness of breath. EXAM: PORTABLE CHEST 1 VIEW COMPARISON:  Single-view of the chest 09/19/2019. FINDINGS: There is extensive airspace disease in left mid and lower lung zones most consistent with pneumonia. The right lung appears clear. Cardiomegaly. No pneumothorax or pleural effusion. IMPRESSION: Airspace disease in the left mid and lower lung zones most consistent with pneumonia. Cardiomegaly. Electronically Signed   By: Inge Rise M.D.   On: 12/10/2019 16:46   ECHOCARDIOGRAM COMPLETE  Result Date: 12/11/2019    ECHOCARDIOGRAM REPORT   Patient Name:   Benjamin Rosario Date of Exam: 12/11/2019 Medical Rec #:  [id number]  Height:       73.0 in Accession #:    [id number]    Weight:       487.2 lb Date of Birth:  2000/12/23     BSA:          3.141 m Patient Age:    18 years      BP:           115/67 mmHg Patient Gender: M             HR:           95 bpm. Exam Location:  Inpatient Procedure: 2D Echo, Cardiac Doppler and Color Doppler Indications:    Dyspnea  History:        Patient has  prior history of Echocardiogram examinations, most                 recent 11/25/2018. Signs/Symptoms:Shortness of Breath and Chest                 Pain; Risk Factors:Hypertension and Diabetes. Covid pneumonia,                 Morbid obesity, resp. failure.  Sonographer:    Lavenia Atlas Referring Phys: [id number] NATHAN PICKERING  Sonographer Comments: Patient is morbidly obese. Image acquisition challenging due to patient body habitus and Image acquisition challenging due to respiratory motion. IMPRESSIONS  1. Technically difficult; vigorous LV systolic function; mild LVH; RV not well visualized but function appears to be reduced.  2. Left ventricular ejection fraction, by estimation, is >75%. The left ventricle has hyperdynamic function. The left ventricle has no regional wall motion abnormalities. There is mild left ventricular hypertrophy. Left ventricular diastolic parameters were normal.  3. Right ventricular systolic function is moderately reduced. The right ventricular size is normal.  4. The mitral valve is normal in structure. No evidence of mitral valve regurgitation. No evidence of mitral stenosis.  5. The aortic valve is normal in structure. Aortic valve regurgitation is not visualized. No aortic stenosis is present. FINDINGS  Left Ventricle: Left ventricular ejection fraction, by estimation, is >75%. The left ventricle has hyperdynamic function. The left ventricle has no regional wall motion abnormalities. The left ventricular internal cavity size was normal in size. There is mild left ventricular hypertrophy. Left ventricular diastolic parameters were normal. Right Ventricle: The right ventricular size is normal. Right ventricular systolic function is moderately reduced. Left Atrium: Left atrial size was normal in size. Right Atrium: Right atrial size was normal in size. Pericardium: There is no evidence of pericardial effusion. Mitral Valve: The mitral valve is normal in structure. Normal mobility of  the mitral valve leaflets. No evidence of mitral valve regurgitation. No evidence of mitral valve stenosis. Tricuspid Valve: The tricuspid valve is normal in structure. Tricuspid valve regurgitation is trivial. No evidence of tricuspid stenosis. Aortic Valve: The aortic valve is normal in structure. Aortic valve regurgitation is not visualized. No aortic stenosis is present. Pulmonic Valve: The pulmonic valve was not well visualized. Pulmonic valve regurgitation is not visualized. No evidence of pulmonic stenosis. Aorta: The aortic root is normal in size and structure. Venous: The inferior vena cava was not well visualized.  Additional Comments: Technically difficult; vigorous LV systolic function; mild LVH; RV not well visualized but function appears to be reduced.  LEFT VENTRICLE PLAX 2D LVIDd:         4.30 cm  Diastology LVIDs:         2.90 cm  LV e'  lateral:   9.90 cm/s LV PW:         1.40 cm  LV E/e' lateral: 8.2 LV IVS:        1.40 cm  LV e' medial:    7.29 cm/s LVOT diam:     2.30 cm  LV E/e' medial:  11.2 LV SV:         89 LV SV Index:   28 LVOT Area:     4.15 cm  RIGHT VENTRICLE RV Basal diam:  3.80 cm RV S prime:     11.00 cm/s TAPSE (M-mode): 2.4 cm LEFT ATRIUM             Index       RIGHT ATRIUM           Index Rosario diam:        3.20 cm 1.02 cm/m  RA Area:     19.10 cm Rosario Vol (A2C):   23.1 ml 7.35 ml/m  RA Volume:   59.20 ml  18.85 ml/m Rosario Vol (A4C):   48.9 ml 15.57 ml/m Rosario Biplane Vol: 35.0 ml 11.14 ml/m  AORTIC VALVE LVOT Vmax:   108.00 cm/s LVOT Vmean:  80.300 cm/s LVOT VTI:    0.214 m  AORTA Ao Root diam: 2.90 cm MITRAL VALVE MV Area (PHT): 7.66 cm    SHUNTS MV Decel Time: 99 msec     Systemic VTI:  0.21 m MV E velocity: 81.40 cm/s  Systemic Diam: 2.30 cm MV A velocity: 67.70 cm/s MV E/A ratio:  1.20 Olga Millers MD Electronically signed by Olga Millers MD Signature Date/Time: 12/11/2019/9:02:50 AM    Final    [place] EKG SITE RITE  Result Date: 12/11/2019 If Site Rite image not attached,  placement could not be confirmed due to current cardiac rhythm.   Lab Results:  CBC    Component Value Date/Time   WBC 10.5 12/16/2019 0953   RBC 5.94 (H) 12/16/2019 0953   HGB 14.6 12/16/2019 0953   HCT 47.8 12/16/2019 0953   PLT 400 12/16/2019 0953   MCV 80.5 12/16/2019 0953   MCH 24.6 (L) 12/16/2019 0953   MCHC 30.5 12/16/2019 0953   RDW 17.0 (H) 12/16/2019 0953   LYMPHSABS 2.8 12/10/2019 1557   MONOABS 0.7 12/10/2019 1557   EOSABS 0.3 12/10/2019 1557   BASOSABS 0.1 12/10/2019 1557    BMET    Component Value Date/Time   NA 140 12/15/2019 0707   K 3.9 12/15/2019 0707   CL 104 12/15/2019 0707   CO2 26 12/15/2019 0707   GLUCOSE 95 12/15/2019 0707   BUN 28 (H) 12/15/2019 0707   CREATININE 0.90 12/15/2019 0707   CALCIUM 8.6 (L) 12/15/2019 0707   GFRNONAA >60 12/15/2019 0707   GFRAA >60 12/15/2019 0707    BNP    Component Value Date/Time   BNP 108.2 (H) 12/11/2019 0349    ProBNP No results found for: PROBNP  Specialty Problems      Pulmonary Problems   Emphysema lung (HCC)   SOB (shortness of breath)   Acute respiratory failure with hypoxemia (HCC)   Community acquired pneumonia   Acute respiratory failure with hypoxia (HCC)   Chronic respiratory failure with hypoxia (HCC)   Pneumonia due to COVID-19 virus      No Known Allergies  Immunization History  Administered Date(s) Administered  . Influenza,inj,Quad PF,6+ Mos 06/06/2013, 12/13/2019    Past Medical History:  Diagnosis Date  . ADHD (attention deficit hyperactivity disorder)   .  Asthma   . Chlamydia 11/25/2018  . Emphysema of lung (HCC) 11/25/2018  . Environmental allergies   . Hypothyroidism 11/25/2018    Tobacco History: Social History   Tobacco Use  Smoking Status Never Smoker  Smokeless Tobacco Never Used   Counseling given: Not Answered   Continue to not smoke  Outpatient Encounter Medications as of 12/31/2019  Medication Sig  . albuterol (PROVENTIL HFA;VENTOLIN HFA) 108  (90 Base) MCG/ACT inhaler Inhale 2 puffs into the lungs every 6 (six) hours as needed for wheezing or shortness of breath.  Marland Kitchen amLODipine (NORVASC) 10 MG tablet Take 1 tablet (10 mg total) by mouth daily.  . carvedilol (COREG) 6.25 MG tablet Take 1 tablet (6.25 mg total) by mouth 2 (two) times daily with a meal.  . hydrochlorothiazide (MICROZIDE) 12.5 MG capsule Take 1 capsule (12.5 mg total) by mouth daily.  Marland Kitchen levothyroxine (SYNTHROID) 75 MCG tablet Take 1 tablet (75 mcg total) by mouth daily before breakfast.  . lisinopril (ZESTRIL) 5 MG tablet Take 1 tablet (5 mg total) by mouth daily.  . metFORMIN (GLUCOPHAGE) 500 MG tablet Take 1 tablet (500 mg total) by mouth 2 (two) times daily with a meal.  . mometasone-formoterol (DULERA) 100-5 MCG/ACT AERO Inhale 2 puffs into the lungs 2 (two) times daily.   No facility-administered encounter medications on file as of 12/31/2019.     Review of Systems  Review of Systems  Constitutional: Positive for fatigue. Negative for activity change, chills, fever and unexpected weight change.  HENT: Negative for postnasal drip, rhinorrhea, sinus pressure, sinus pain and sore throat.   Eyes: Negative.   Respiratory: Positive for shortness of breath. Negative for cough and wheezing.   Cardiovascular: Negative for chest pain and palpitations.  Gastrointestinal: Negative for constipation, diarrhea, nausea and vomiting.  Endocrine: Negative.   Genitourinary: Negative.   Musculoskeletal: Negative.   Skin: Negative.   Neurological: Negative for dizziness and headaches.  Psychiatric/Behavioral: Negative.  Negative for dysphoric mood. The patient is not nervous/anxious.   All other systems reviewed and are negative.    Physical Exam  BP 120/86 (BP Location: Left Arm, Cuff Size: Large)   Pulse 96   Temp (!) 97 F (36.1 C) (Temporal)   Ht  (1.854 m)   Wt (!) 449 lb 3.2 oz (203.8 kg)   SpO2 90%   BMI 59.26 kg/m   Wt Readings from Last 5 Encounters:   12/31/19 (!) 449 lb 3.2 oz (203.8 kg)  12/23/19 (!) 443 lb 4.8 oz (201.1 kg)  12/16/19 (!) 460 lb 12.8 oz (209 kg)  09/20/19 (!) 448 lb 10.2 oz (203.5 kg)  06/10/19 (!) 438 lb 4.4 oz (198.8 kg)    BMI Readings from Last 5 Encounters:  12/31/19 59.26 kg/m  12/23/19 58.49 kg/m  12/16/19 60.80 kg/m  09/20/19 59.19 kg/m  06/10/19 57.82 kg/m     Physical Exam Vitals and nursing note reviewed.  Constitutional:      General: He is not in acute distress.    Appearance: Normal appearance. He is obese.  HENT:     Head: Normocephalic and atraumatic.     Right Ear: Hearing, tympanic membrane, ear canal and external ear normal. There is no impacted cerumen.     Left Ear: Hearing, tympanic membrane, ear canal and external ear normal. There is no impacted cerumen.     Nose: Nose normal. No mucosal edema or rhinorrhea.     Right Turbinates: Not enlarged.     Left Turbinates:  Not enlarged.     Mouth/Throat:     Mouth: Mucous membranes are dry.     Pharynx: Oropharynx is clear. No oropharyngeal exudate.  Eyes:     Pupils: Pupils are equal, round, and reactive to light.  Cardiovascular:     Rate and Rhythm: Normal rate and regular rhythm.     Pulses: Normal pulses.     Heart sounds: Normal heart sounds. No murmur.  Pulmonary:     Effort: Pulmonary effort is normal.     Breath sounds: Normal breath sounds. No decreased breath sounds, wheezing or rales.  Musculoskeletal:     Cervical back: Normal range of motion.     Right lower leg: No edema.     Left lower leg: No edema.  Lymphadenopathy:     Cervical: No cervical adenopathy.  Skin:    General: Skin is warm and dry.     Capillary Refill: Capillary refill takes less than 2 seconds.     Findings: No erythema or rash.  Neurological:     General: No focal deficit present.     Mental Status: He is alert and oriented to person, place, and time.     Motor: No weakness.     Coordination: Coordination normal.     Gait: Gait is  intact. Gait normal.  Psychiatric:        Mood and Affect: Mood normal.        Behavior: Behavior normal. Behavior is cooperative.        Thought Content: Thought content normal.        Judgment: Judgment normal.       Assessment & Plan:   Chronic respiratory failure with hypoxia (HCC) Walk today in office, patient completed 2 laps, oxygen levels briefly went to 87%, then O2 levels corrected  Plan: We will bring patient back in 2 weeks for 6-minute walk to further evaluate oxygen needs Recommended continue CPAP use Recommended continue with inhaler use 4 to 6-week follow-up with our office  Emphysema lung (HCC) Alpha-1 levels in October/2020 are normal Limited smoking history  Plan: Pulmonary function testing ordered Continue Dulera 2-week 6-minute walk   Pneumonia due to COVID-19 virus History of COVID-19 infection Potential scarring from COVID-19 infection  Plan: 2 weeks -  6-minute walk Pulmonary function testing ordered  At risk for obstructive sleep apnea High risk for obstructive sleep apnea Also high risk for obesity hypoventilation syndrome Patient currently managed on CPAP We will request a download Never has had formal sleep study  Plan: Split-night sleep study ordered Continue CPAP  Morbid obesity with BMI of 50.0-59.9, adult (HCC) Plan: Continue to work on increasing your overall physical activity Work to reduce BMI Continue to follow-up with primary care   Pulmonary HTN (HCC) Suspected pulmonary hypertension RV dysfunction on April/2021 echo  Plan: Split-night sleep study ordered Pulmonary function test ordered 6-minute walk to be completed in 2 weeks Close follow-up with our office    Return in about 6 weeks (around 02/11/2020), or if symptoms worsen or fail to improve, for Follow up with Dr. Craige CottaSood, Follow up with Elisha HeadlandBrian Kalmen Lollar FNP-C.   Coral CeoBrian P Sewell Pitner, NP 12/31/2019   This appointment required 42 minutes of patient care (this includes  precharting, chart review, review of results, face-to-face care, etc.).

## 2019-12-31 NOTE — Assessment & Plan Note (Signed)
History of COVID-19 infection Potential scarring from COVID-19 infection  Plan: 2 weeks -  6-minute walk Pulmonary function testing ordered

## 2019-12-31 NOTE — Patient Instructions (Addendum)
You were seen today by Coral Ceo, NP  for:   It was a pleasure seeing you today in office.  I am glad you are doing better since getting discharged from the hospital.  I do agree with her previous work-up from September/2020 that you are high risk of obstructive sleep apnea.  We will order a split-night sleep study to be completed in the sleep lab to further evaluate.  This will also be helpful to work on mask fit as sleep techs will be present.  Continue to use your CPAP every night to the best of your ability.  Keep close follow-up with our office.  You also need to complete a pulmonary function test.  Take care and stay safe,  Benjamin Rosario  1. At risk for obstructive sleep apnea  - Split night study; Future  We recommend that you continue using your CPAP daily >>>Keep up the hard work using your device >>> Goal should be wearing this for the entire night that you are sleeping, at least 4 to 6 hours  Remember:  . Do not drive or operate heavy machinery if tired or drowsy.  . Please notify the supply company and office if you are unable to use your device regularly due to missing supplies or machine being broken.  . Work on maintaining a healthy weight and following your recommended nutrition plan  . Maintain proper daily exercise and movement  . Maintaining proper use of your device can also help improve management of other chronic illnesses such as: Blood pressure, blood sugars, and weight management.   BiPAP/ CPAP Cleaning:  >>>Clean weekly, with Dawn soap, and bottle brush.  Set up to air dry. >>> Wipe mask out daily with wet wipe or towelette    2. Chronic respiratory failure with hypoxia (HCC)  Walk today in office  Continue oxygen therapy as prescribed  >>>maintain oxygen saturations greater than 88 percent  >>>if unable to maintain oxygen saturations please contact the office  >>>do not smoke with oxygen  >>>can use nasal saline gel or nasal saline rinses to moisturize nose  if oxygen causes dryness  3. Unilateral emphysema (HCC)  We will order a breathing test to further evaluate your lung functioning  4. Morbid obesity with BMI of 50.0-59.9, adult (HCC)  Continue to work with primary care on healthy weight loss  Work to reduce your BMI  If you ever interested in a referral to healthy weight and wellness we are more than happy to coordinate this for you   We recommend today:  Orders Placed This Encounter  Procedures  . Split night study    Standing Status:   Future    Standing Expiration Date:   12/31/2020    Scheduling Instructions:     Schedule ASAP    Order Specific Question:   Where should this test be performed:    Answer:   [facility] - Pulmonary   Orders Placed This Encounter  Procedures  . Split night study   No orders of the defined types were placed in this encounter.   Follow Up:    Return in about 6 weeks (around 02/11/2020), or if symptoms worsen or fail to improve, for Follow up with Dr. Craige Cotta, Follow up with Elisha Headland FNP-C.  Patient needs pulmonary function test at next available slot      Please do your part to reduce the spread of COVID-19:      Reduce your risk of any infection  and COVID19 by  using the similar precautions used for avoiding the common cold or flu:  Marland Kitchen Wash your hands often with soap and warm water for at least 20 seconds.  If soap and water are not readily available, use an alcohol-based hand sanitizer with at least 60% alcohol.  . If coughing or sneezing, cover your mouth and nose by coughing or sneezing into the elbow areas of your shirt or coat, into a tissue or into your sleeve (not your hands). Langley Gauss A MASK when in public  . Avoid shaking hands with others and consider head nods or verbal greetings only. . Avoid touching your eyes, nose, or mouth with unwashed hands.  . Avoid close contact with people who are sick. . Avoid places or events with large numbers of people in one location, like concerts or  sporting events. . If you have some symptoms but not all symptoms, continue to monitor at home and seek medical attention if your symptoms worsen. . If you are having a medical emergency, call 911.   [place] / e-Visit: [email or web address]         [facility]: [facility]: [phone]                   [facility]: [phone]     It is flu season:   >>> Best ways to protect herself from the flu: Receive the yearly flu vaccine, practice good hand hygiene washing with soap and also using hand sanitizer when available, eat a nutritious meals, get adequate rest, hydrate appropriately   Please contact the office if your symptoms worsen or you have concerns that you are not improving.   Thank you for choosing [facility] for your healthcare, and for allowing [place] to partner with you on your healthcare journey. I am thankful to be able to provide care to you today.   Wyn Quaker FNP-C

## 2019-12-31 NOTE — Assessment & Plan Note (Signed)
Alpha-1 levels in October/2020 are normal Limited smoking history  Plan: Pulmonary function testing ordered Continue Dulera 2-week 6-minute walk

## 2019-12-31 NOTE — Assessment & Plan Note (Signed)
Plan: Continue to work on increasing your overall physical activity Work to reduce BMI Continue to follow-up with primary care

## 2019-12-31 NOTE — Assessment & Plan Note (Signed)
High risk for obstructive sleep apnea Also high risk for obesity hypoventilation syndrome Patient currently managed on CPAP We will request a download Never has had formal sleep study  Plan: Split-night sleep study ordered Continue CPAP

## 2020-01-01 NOTE — Progress Notes (Signed)
Reviewed and agree with assessment/plan.   Shonte Soderlund, MD [facility] 09/05/2016, 12:24 PM Pager:  [phone]  

## 2020-01-14 ENCOUNTER — Ambulatory Visit: Payer: 59

## 2020-01-20 ENCOUNTER — Encounter: Payer: Self-pay | Admitting: Internal Medicine

## 2020-01-20 ENCOUNTER — Ambulatory Visit: Payer: 59 | Admitting: Internal Medicine

## 2020-01-20 ENCOUNTER — Other Ambulatory Visit: Payer: Self-pay

## 2020-01-20 DIAGNOSIS — I10 Essential (primary) hypertension: Secondary | ICD-10-CM

## 2020-01-20 NOTE — Progress Notes (Signed)
I connected with  Benjamin Rosario on 01/20/20 by a video enabled telemedicine application and verified that I am speaking with the correct person using two identifiers.   I discussed the limitations of evaluation and management by telemedicine. The patient expressed understanding and agreed to proceed.  Patient was scheduled for a telehealth visit and should have been seen in person. I will have him rescheduled for BP check and BMP.  He has upcoming appointment scheduled for sleep study.  Thurmon Fair, MD PGY1

## 2020-01-28 ENCOUNTER — Inpatient Hospital Stay (HOSPITAL_COMMUNITY): Admission: RE | Admit: 2020-01-28 | Payer: 59 | Source: Ambulatory Visit

## 2020-01-30 ENCOUNTER — Ambulatory Visit (HOSPITAL_BASED_OUTPATIENT_CLINIC_OR_DEPARTMENT_OTHER): Payer: 59 | Admitting: Pulmonary Disease

## 2020-02-06 ENCOUNTER — Other Ambulatory Visit (HOSPITAL_COMMUNITY)
Admission: RE | Admit: 2020-02-06 | Discharge: 2020-02-06 | Disposition: A | Discharge status: Home / Self Care | Payer: No Typology Code available for payment source | Source: Ambulatory Visit | Attending: Pulmonary Disease | Admitting: Pulmonary Disease

## 2020-02-06 DIAGNOSIS — Z20822 Contact with and (suspected) exposure to covid-19: Secondary | ICD-10-CM | POA: Insufficient documentation

## 2020-02-06 DIAGNOSIS — Z01812 Encounter for preprocedural laboratory examination: Secondary | ICD-10-CM | POA: Insufficient documentation

## 2020-02-07 ENCOUNTER — Other Ambulatory Visit: Payer: Self-pay

## 2020-02-07 ENCOUNTER — Ambulatory Visit (HOSPITAL_BASED_OUTPATIENT_CLINIC_OR_DEPARTMENT_OTHER): Payer: No Typology Code available for payment source | Attending: Pulmonary Disease | Admitting: Pulmonary Disease

## 2020-02-07 DIAGNOSIS — G4733 Obstructive sleep apnea (adult) (pediatric): Secondary | ICD-10-CM | POA: Insufficient documentation

## 2020-02-07 DIAGNOSIS — R0902 Hypoxemia: Secondary | ICD-10-CM | POA: Insufficient documentation

## 2020-02-07 DIAGNOSIS — Z9189 Other specified personal risk factors, not elsewhere classified: Secondary | ICD-10-CM

## 2020-02-07 LAB — SARS CORONAVIRUS 2 (TAT 6-24 HRS): SARS Coronavirus 2: NEGATIVE

## 2020-02-11 ENCOUNTER — Ambulatory Visit: Payer: 59 | Admitting: Pulmonary Disease

## 2020-02-11 NOTE — Progress Notes (Deleted)
@Patient  ID: Benjamin Rosario, male    DOB: [birth year], 19 y.o.   MRN: [id number]  No chief complaint on file.   Referring provider: Madalyn Rob, MD  HPI:  19 year old male former smoker initially referred to our office in September/2020.  Patient was suspected obesity hypoventilation syndrome/obstructive sleep apnea.  Patient with significant emphysema on CT imaging.  PMH: Hypothyroidism, ADHD, Meningitis, hyper tension, morbid obesity, prediabetes, suspected asthma, congenital pulmonary stenosis Smoker/ Smoking History: Former smoker Maintenance:   Pt of: Dr. Halford Rosario  02/11/2020  - Visit     Questionaires / Pulmonary Flowsheets:   ACT:  No flowsheet data found.  MMRC: mMRC Dyspnea Scale mMRC Score  12/31/2019 1    Epworth:  Results of the Epworth flowsheet 06/04/2019  Sitting and reading 2  Watching TV 3  Sitting, inactive in a public place (e.g. a theatre or a meeting) 1  As a passenger in a car for an hour without a break 0  Lying down to rest in the afternoon when circumstances permit 2  Sitting and talking to someone 0  Sitting quietly after a lunch without alcohol 0  In a car, while stopped for a few minutes in traffic 0  Total score 8    Tests:   Pulmonary tests:  ABG 04/13/19 >> pH 7.35, PCO2 50.1, PO2 37.5  Chest imaging:  CT chest 11/25/18 >> paraseptal emphysema with early bullous changes and apical thickening, asymmetric peribronchovascular nodularity  06/11/2019-CT chest high-res-extensive heterogeneous airspace disease in the left lower lobe consistent with infection, there is moderate paraseptal and centrilobular emphysema predominantly involving the right lung and right upper lobe, unusually advancer patient age with significant volume loss of right lung, extensive bronchial wall thickening throughout right lung, 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, this consultation findings has been reported in the literature and may  represent with recurrent infection and hemoptysis, emphysema, there are scattered small pulmonary nodules  12/10/2019-CTA chest-right lung demonstrate significant emphysematous changes and volume loss consistent with the absent pulmonary artery, these changes are stable from prior exam, left lung is well aerated and demonstrates patchy infiltrates throughout the left lung, this is consistent with that seen on a recent chest x-ray consistent with multifocal pneumonia, additionally there may be some sequela from patient's known COVID-19 infection Benjamin Rosario, somewhat limited exam for PE as described above  12/11/2019-echocardiogram-technically difficult study, vigorous LV systolic function, mild LVH, RV not well visualized but function appears to be reduced   FENO:  No results found for: NITRICOXIDE  PFT: No flowsheet data found.  WALK:  SIX MIN WALK 12/31/2019  Supplimental Oxygen during Test? (L/min) No  Tech Comments: Pt. walked at a good pace throughout walk without having to stop for rest breaks.  He briefly dropped to 87% on RA at the end of the walk.  Took a few minutes to return to 98%.  Tolerated walk well.    Imaging: No results found.  Lab Results:  CBC    Component Value Date/Time   WBC 10.5 12/16/2019 0953   RBC 5.94 (H) 12/16/2019 0953   HGB 14.6 12/16/2019 0953   HCT 47.8 12/16/2019 0953   PLT 400 12/16/2019 0953   MCV 80.5 12/16/2019 0953   MCH 24.6 (L) 12/16/2019 0953   MCHC 30.5 12/16/2019 0953   RDW 17.0 (H) 12/16/2019 0953   LYMPHSABS 2.8 12/10/2019 1557   MONOABS 0.7 12/10/2019 1557   EOSABS 0.3 12/10/2019 1557   BASOSABS 0.1 12/10/2019 1557  BMET    Component Value Date/Time   NA 140 12/15/2019 0707   K 3.9 12/15/2019 0707   CL 104 12/15/2019 0707   CO2 26 12/15/2019 0707   GLUCOSE 95 12/15/2019 0707   BUN 28 (H) 12/15/2019 0707   CREATININE 0.90 12/15/2019 0707   CALCIUM 8.6 (L) 12/15/2019 0707   GFRNONAA >60 12/15/2019 0707   GFRAA >60 12/15/2019  0707    BNP    Component Value Date/Time   BNP 108.2 (H) 12/11/2019 0349    ProBNP No results found for: PROBNP  Specialty Problems      Pulmonary Problems   Emphysema lung (HCC)   SOB (shortness of breath)   Acute respiratory failure with hypoxemia (HCC)   Community acquired pneumonia   Acute respiratory failure with hypoxia (HCC)   Chronic respiratory failure with hypoxia (HCC)   Pneumonia due to COVID-19 virus      No Known Allergies  Immunization History  Administered Date(s) Administered  . Influenza,inj,Quad PF,6+ Mos 06/06/2013, 12/13/2019    Past Medical History:  Diagnosis Date  . ADHD (attention deficit hyperactivity disorder)   . Asthma   . Chlamydia 11/25/2018  . Emphysema of lung (HCC) 11/25/2018  . Environmental allergies   . Hypothyroidism 11/25/2018    Tobacco History: Social History   Tobacco Use  Smoking Status Never Smoker  Smokeless Tobacco Never Used   Counseling given: Not Answered   Continue to not smoke  Outpatient Encounter Medications as of 02/11/2020  Medication Sig  . albuterol (PROVENTIL HFA;VENTOLIN HFA) 108 (90 Base) MCG/ACT inhaler Inhale 2 puffs into the lungs every 6 (six) hours as needed for wheezing or shortness of breath.  Marland Kitchen amLODipine (NORVASC) 10 MG tablet Take 1 tablet (10 mg total) by mouth daily.  . carvedilol (COREG) 6.25 MG tablet Take 1 tablet (6.25 mg total) by mouth 2 (two) times daily with a meal.  . hydrochlorothiazide (MICROZIDE) 12.5 MG capsule Take 1 capsule (12.5 mg total) by mouth daily.  Marland Kitchen levothyroxine (SYNTHROID) 75 MCG tablet Take 1 tablet (75 mcg total) by mouth daily before breakfast.  . lisinopril (ZESTRIL) 5 MG tablet Take 1 tablet (5 mg total) by mouth daily.  . metFORMIN (GLUCOPHAGE) 500 MG tablet Take 1 tablet (500 mg total) by mouth 2 (two) times daily with a meal.  . mometasone-formoterol (DULERA) 100-5 MCG/ACT AERO Inhale 2 puffs into the lungs 2 (two) times daily.   No  facility-administered encounter medications on file as of 02/11/2020.     Review of Systems  Review of Systems   Physical Exam  There were no vitals taken for this visit.  Wt Readings from Last 5 Encounters:  02/07/20 (!) 452 lb (205 kg)  12/31/19 (!) 449 lb 3.2 oz (203.8 kg)  12/23/19 (!) 443 lb 4.8 oz (201.1 kg)  12/16/19 (!) 460 lb 12.8 oz (209 kg)  09/20/19 (!) 448 lb 10.2 oz (203.5 kg)    BMI Readings from Last 5 Encounters:  02/07/20 59.63 kg/m  12/31/19 59.26 kg/m  12/23/19 58.49 kg/m  12/16/19 60.80 kg/m  09/20/19 59.19 kg/m     Physical Exam    Assessment & Plan:   No problem-specific Assessment & Plan notes found for this encounter.    No follow-ups on file.   Coral Ceo, NP 02/11/2020   This appointment required *** minutes of patient care (this includes precharting, chart review, review of results, face-to-face care, etc.).

## 2020-02-15 ENCOUNTER — Telehealth: Payer: Self-pay | Admitting: Pulmonary Disease

## 2020-02-15 DIAGNOSIS — Z9189 Other specified personal risk factors, not elsewhere classified: Secondary | ICD-10-CM

## 2020-02-15 DIAGNOSIS — G4733 Obstructive sleep apnea (adult) (pediatric): Secondary | ICD-10-CM

## 2020-02-15 NOTE — Telephone Encounter (Signed)
PSG 02/07/20 >> AHI 93.6, SpO2 low 65%.  Needed 2 liters oxygen with CPAP.    Please let him know his sleep study showed severe obstructive sleep apnea and low oxygen at night.  Please place order to start him on auto CPAP range 10 to 20 cm H2O and he will need to use 2 liters oxygen at night with CPAP.  Please schedule him for an ROV with me or NP to discuss testing findings in more detail.

## 2020-02-15 NOTE — Procedures (Signed)
    Patient Name: Benjamin Rosario, [place] Date: 02/07/2020 Gender: Male D.O.B: Oct 15, 2000 Age (years): 57 Referring Provider: Lauraine Rinne NP Height (inches): 25 Interpreting Physician: Chesley Mires MD, ABSM Weight (lbs): 452 RPSGT: Baxter Flattery BMI: 63 MRN: [id number] Neck Size: 21.50  CLINICAL INFORMATION Sleep Study Type: Split Night CPAP  Indication for sleep study: Snoring, Witnessed Apneas  Epworth Sleepiness Score: 11  SLEEP STUDY TECHNIQUE As per the AASM Manual for the Scoring of Sleep and Associated Events v2.3 (April 2016) with a hypopnea requiring 4% desaturations.  The channels recorded and monitored were frontal, central and occipital EEG, electrooculogram (EOG), submentalis EMG (chin), nasal and oral airflow, thoracic and abdominal wall motion, anterior tibialis EMG, snore microphone, electrocardiogram, and pulse oximetry. Continuous positive airway pressure (CPAP) was initiated when the patient met split night criteria and was titrated according to treat sleep-disordered breathing.  MEDICATIONS Medications self-administered by patient taken the night of the study : N/A  RESPIRATORY PARAMETERS Diagnostic  Total AHI (/hr): 93.6 RDI (/hr): 93.6 OA Index (/hr): 9.3 CA Index (/hr): 0.0 REM AHI (/hr): N/A NREM AHI (/hr): 93.6 Supine AHI (/hr): 71.6 Non-supine AHI (/hr): 161.9 Min O2 Sat (%): 65.0 Mean O2 (%): 81.3 Time below 88% (min): 112.8   Titration  Optimal Pressure (cm): 19 AHI at Optimal Pressure (/hr): 0.0 Min O2 at Optimal Pressure (%): 92.0 Supine % at Optimal (%): 100 Sleep % at Optimal (%): 89   SLEEP ARCHITECTURE The recording time for the entire night was 391.7 minutes.  During a baseline period of 211.4 minutes, the patient slept for 129.5 minutes in REM and nonREM, yielding a sleep efficiency of 61.3%%. Sleep onset after lights out was 73.2 minutes with a REM latency of N/A minutes. The patient spent 5.8%% of the night in stage N1 sleep, 94.2%%  in stage N2 sleep, 0.0%% in stage N3 and 0% in REM.  During the titration period of 178.9 minutes, the patient slept for 149.0 minutes in REM and nonREM, yielding a sleep efficiency of 83.3%%. Sleep onset after CPAP initiation was 1.9 minutes with a REM latency of 67.5 minutes. The patient spent 15.1%% of the night in stage N1 sleep, 55.7%% in stage N2 sleep, 0.0%% in stage N3 and 29.2% in REM.  CARDIAC DATA The 2 lead EKG demonstrated sinus rhythm. The mean heart rate was 100.0 beats per minute. Other EKG findings include: PVCs.  LEG MOVEMENT DATA The total Periodic Limb Movements of Sleep (PLMS) were 0. The PLMS index was 0.0 .  IMPRESSIONS - Severe obstructive sleep apnea occurred during the diagnostic portion of the study (AHI = 93.6/hour). - He required pressure settings between 15 and 19 cm H2O on CPAP to control his obstructive sleep apnea. - He had persistent hypoxemia while on CPAP in the absence of other respiratory events and this last for more than 5 minutes.  He had 2 liters supplemental oxygen applied with improvement in his SpO2.  DIAGNOSIS - Obstructive Sleep Apnea (327.23 [G47.33 ICD-10])  RECOMMENDATIONS - Trial of auto CPAP therapy with range of 10 to 20 cm H2O with 2 liters oxygen at night with CPAP. - He was fitted with a Large size Fisher&Paykel Full Face Mask F&P Vitera (new) mask and heated humidification.  [Electronically signed] 02/15/2020 03:45 PM  Chesley Mires MD, [place], American Board of Sleep Medicine   NPI: [id number]

## 2020-02-15 NOTE — Telephone Encounter (Signed)
02/15/2020  Please see message from Dr. Craige Cotta listed below regarding patient's sleep study.  We will route to triage to ensure patient gets urgent contact as well as follow-up.  Patient no showed to last week's office visit.  Elisha Headland, FNP

## 2020-02-15 NOTE — Telephone Encounter (Signed)
I called pt but there was no answer. No VM to leave message. Will hold in triage

## 2020-02-16 NOTE — Telephone Encounter (Signed)
Spoke with patient and his mother. They verbalized understanding of results. Patient stated that he already has a CPAP machine from a company called [employer] as well as a concentrator. I advised him that I could place an order to them for them to adjust the cpap pressure as well as adding a piece for O2. Both the patient and his mother verbalized understanding.   Patient's mother has requested a copy of the sleep study results to be mailed to her. Results have been printed to his mother in [place].   Orders have been placed.   Nothing further needed at time of call.

## 2020-02-27 ENCOUNTER — Emergency Department (HOSPITAL_COMMUNITY)
Admission: EM | Admit: 2020-02-27 | Discharge: 2020-02-27 | Disposition: A | Discharge status: Home / Self Care | Payer: No Typology Code available for payment source | Attending: Emergency Medicine | Admitting: Emergency Medicine

## 2020-02-27 ENCOUNTER — Other Ambulatory Visit: Payer: Self-pay

## 2020-02-27 ENCOUNTER — Encounter (HOSPITAL_COMMUNITY): Payer: Self-pay

## 2020-02-27 DIAGNOSIS — H9202 Otalgia, left ear: Secondary | ICD-10-CM | POA: Diagnosis not present

## 2020-02-27 DIAGNOSIS — I10 Essential (primary) hypertension: Secondary | ICD-10-CM | POA: Insufficient documentation

## 2020-02-27 DIAGNOSIS — Z7984 Long term (current) use of oral hypoglycemic drugs: Secondary | ICD-10-CM | POA: Insufficient documentation

## 2020-02-27 DIAGNOSIS — Z79899 Other long term (current) drug therapy: Secondary | ICD-10-CM | POA: Insufficient documentation

## 2020-02-27 DIAGNOSIS — J45909 Unspecified asthma, uncomplicated: Secondary | ICD-10-CM | POA: Insufficient documentation

## 2020-02-27 DIAGNOSIS — E119 Type 2 diabetes mellitus without complications: Secondary | ICD-10-CM | POA: Insufficient documentation

## 2020-02-27 DIAGNOSIS — H60312 Diffuse otitis externa, left ear: Secondary | ICD-10-CM | POA: Insufficient documentation

## 2020-02-27 DIAGNOSIS — E039 Hypothyroidism, unspecified: Secondary | ICD-10-CM | POA: Diagnosis not present

## 2020-02-27 DIAGNOSIS — F1290 Cannabis use, unspecified, uncomplicated: Secondary | ICD-10-CM | POA: Diagnosis not present

## 2020-02-27 MED ORDER — NEOMYCIN-POLYMYXIN-HC 3.5-10000-1 OT SUSP
4.0000 [drp] | Freq: Three times a day (TID) | OTIC | 0 refills | Status: AC
Start: 2020-02-27 — End: 2020-03-05

## 2020-02-27 NOTE — Discharge Instructions (Addendum)
I have prescribed antibiotic drops to help with your ear infection. Please apply 4 drops to your left ear three times a day for the next 7 days.   You may continue taking ibuprofen or tylenol for pain control.

## 2020-02-27 NOTE — ED Triage Notes (Addendum)
Pt reports L sided otalgia x 2-3 days. States that it started as an itch and has progressed to pain. Denies blood or drainage. Pain radiates down to his jaw. States that he is unable to sleep. A&Ox4.

## 2020-02-27 NOTE — ED Provider Notes (Signed)
[facility]-EMERGENCY DEPT Provider Note   CSN: [id number] Arrival date & time: 02/27/20  0600     History Chief Complaint  Patient presents with  . Otalgia    L    Benjamin Rosario is a 19 y.o. male.  19 y.o male with no PMH presents to the ED with a chief complaint of left ear pain x 2 days. Patient reports a feeling of "water in my left ear", pain is sharp to the left ear with radiation to his jaw. Exacerbated with "trying to pop his ear". He also endorses decreased in hearing.  Stating he can hear himself talk.  He has taken ibuprofen with mild improvement in his symptoms.  He also attempted to use a Q-tip on his left ear.  No fever, sinus congestion, or sore throat.   The history is provided by the patient.       Past Medical History:  Diagnosis Date  . ADHD (attention deficit hyperactivity disorder)   . Asthma   . Chlamydia 11/25/2018  . Emphysema of lung (HCC) 11/25/2018  . Environmental allergies   . Hypothyroidism 11/25/2018    Patient Active Problem List   Diagnosis Date Noted  . At risk for obstructive sleep apnea 12/31/2019  . Pulmonary HTN (HCC) 12/31/2019  . Essential hypertension 12/23/2019  . Elevated troponin 12/10/2019  . Pneumonia due to COVID-19 virus 09/19/2019  . Chronic respiratory failure with hypoxia (HCC)   . Acute respiratory failure with hypoxia (HCC) 06/10/2019  . Hypokalemia 06/10/2019  . Morbid obesity with BMI of 50.0-59.9, adult (HCC) 06/10/2019  . Acute respiratory failure with hypoxemia (HCC) 06/07/2019  . Community acquired pneumonia 06/07/2019  . Hypothyroidism 11/25/2018  . Chlamydia 11/25/2018  . SOB (shortness of breath) 11/25/2018  . Chest pain 11/25/2018  . Emphysema lung (HCC) 11/25/2018  . Concussion with no loss of consciousness 07/27/2015  . ADHD (attention deficit hyperactivity disorder), combined type 06/07/2013  . MDD (major depressive disorder), single episode, moderate (HCC) 06/06/2013  . Mixed  hyperlipidemia 02/26/2011  . Obesity 02/26/2011  . Type 2 diabetes mellitus (HCC) 02/26/2011  . Other specified acquired hypothyroidism 02/26/2011    History reviewed. No pertinent surgical history.     Family History  Problem Relation Age of Onset  . Hypertension Father   . Diabetes Father     Social History   Tobacco Use  . Smoking status: Never Smoker  . Smokeless tobacco: Never Used  Vaping Use  . Vaping Use: Never used  Substance Use Topics  . Alcohol use: No    Alcohol/week: 0.0 standard drinks  . Drug use: Yes    Types: Marijuana    Home Medications Prior to Admission medications   Medication Sig Start Date End Date Taking? Authorizing Provider  albuterol (PROVENTIL HFA;VENTOLIN HFA) 108 (90 Base) MCG/ACT inhaler Inhale 2 puffs into the lungs every 6 (six) hours as needed for wheezing or shortness of breath. 11/28/18   Purohit, Salli Quarry, MD  amLODipine (NORVASC) 10 MG tablet Take 1 tablet (10 mg total) by mouth daily. 06/15/19   Almon Hercules, MD  carvedilol (COREG) 6.25 MG tablet Take 1 tablet (6.25 mg total) by mouth 2 (two) times daily with a meal. 12/16/19   Kirt Boys, MD  hydrochlorothiazide (MICROZIDE) 12.5 MG capsule Take 1 capsule (12.5 mg total) by mouth daily. 12/16/19   Kirt Boys, MD  levothyroxine (SYNTHROID) 75 MCG tablet Take 1 tablet (75 mcg total) by mouth daily before breakfast. 12/16/19  Earlene Plater, MD  lisinopril (ZESTRIL) 5 MG tablet Take 1 tablet (5 mg total) by mouth daily. 12/23/19 01/22/20  Ladona Horns, MD  metFORMIN (GLUCOPHAGE) 500 MG tablet Take 1 tablet (500 mg total) by mouth 2 (two) times daily with a meal. 12/16/19 12/15/20  Earlene Plater, MD  mometasone-formoterol [facility]) 100-5 MCG/ACT AERO Inhale 2 puffs into the lungs 2 (two) times daily. 06/09/19   Rai, Vernelle Emerald, MD  neomycin-polymyxin-hydrocortisone (CORTISPORIN) 3.5-10000-1 OTIC suspension Place 4 drops into the left ear 3 (three) times daily for 7 days. 02/27/20  03/05/20  Janeece Fitting, PA-C    Allergies    Patient has no known allergies.  Review of Systems   Review of Systems  Constitutional: Negative for chills and fever.  HENT: Positive for ear pain. Negative for ear discharge, sinus pressure, sneezing, sore throat and tinnitus.     Physical Exam Updated Vital Signs BP 135/88 (BP Location: Left Arm)   Pulse 96   Temp 98.8 F (37.1 C) (Oral)   Resp 18   SpO2 95%   Physical Exam Vitals and nursing note reviewed.  Constitutional:      Appearance: Normal appearance.  HENT:     Head: Normocephalic and atraumatic.     Right Ear: Decreased hearing noted. Tenderness present. No drainage. No middle ear effusion. There is no impacted cerumen. No foreign body. No mastoid tenderness. Tympanic membrane is injected and erythematous. Tympanic membrane is not scarred, perforated, retracted or bulging.     Left Ear: Tympanic membrane normal.     Ears:     Comments: Left ear canal is injected, with scattered drainage, pain with palpation of the tragus. No pain along the mastoid region.     Nose: Nose normal.     Mouth/Throat:     Mouth: Mucous membranes are moist.     Pharynx: No oropharyngeal exudate or posterior oropharyngeal erythema.  Eyes:     Pupils: Pupils are equal, round, and reactive to light.  Cardiovascular:     Rate and Rhythm: Normal rate.  Pulmonary:     Effort: Pulmonary effort is normal.     Breath sounds: No wheezing or rales.  Abdominal:     General: Abdomen is flat.     Tenderness: There is no abdominal tenderness.  Musculoskeletal:     Cervical back: Normal range of motion and neck supple.  Skin:    General: Skin is warm and dry.  Neurological:     Mental Status: He is alert and oriented to person, place, and time.     ED Results / Procedures / Treatments   Labs (all labs ordered are listed, but only abnormal results are displayed) Labs Reviewed - No data to display  EKG None  Radiology No results  found.  Procedures Procedures (including critical care time)  Medications Ordered in ED Medications - No data to display  ED Course  I have reviewed the triage vital signs and the nursing notes.  Pertinent labs & imaging results that were available during my care of the patient were reviewed by me and considered in my medical decision making (see chart for details).    MDM Rules/Calculators/A&P   Patient with no PMH presents to ED with a chief complaint of bilateral ear pain, left worse than right for the past 2 days.  Has tried over-the-counter medication such as ibuprofen without improvement in symptoms.  There is pain with palpation of the tragus, ear canal appears red and with scattered drainage present.  He reports no fever, no shortness of breath, no URI symptoms. TM is erythematous but no bulging noted.   Suspicion for left otitis externa, will treat with otic drops for the next 3 days. No prior hx of diabetes reported per patient. No pan along the mastoid region, lower suspicion for mastoiditis. Oropharynx without exudates or erythema. Vitals are within normal limits. Patient is stable for discharge.    Portions of this note were generated with Scientist, clinical (histocompatibility and immunogenetics). Dictation errors may occur despite best attempts at proofreading.  Final Clinical Impression(s) / ED Diagnoses Final diagnoses:  Left ear pain  Acute diffuse otitis externa of left ear    Rx / DC Orders ED Discharge Orders         Ordered    neomycin-polymyxin-hydrocortisone (CORTISPORIN) 3.5-10000-1 OTIC suspension  3 times daily     Discontinue  Reprint     02/27/20 0924           Claude Manges, PA-C 02/27/20 [id number]    Rolan Bucco, MD 02/27/20 1322

## 2020-04-12 ENCOUNTER — Encounter: Payer: No Typology Code available for payment source | Admitting: Internal Medicine

## 2020-04-12 NOTE — Progress Notes (Deleted)
   CC: ***  HPI:[name] is a 19 y.o. male who presents for evaluation of ***. Please see individual problem based A/P for details.  ***: ***  Plan: ***  ***: ***  Plan:  ***  ***: ***  Plan:  ***  Depression, PHQ-9: Based on the patients    Office Visit from 12/23/2019 in [facility]  PHQ-9 Total Score 0     score we have ***.  Past Medical History:  Diagnosis Date  . ADHD (attention deficit hyperactivity disorder)   . Asthma   . Chlamydia 11/25/2018  . Emphysema of lung (HCC) 11/25/2018  . Environmental allergies   . Hypothyroidism 11/25/2018   Review of Systems:  *** ROS negative except as per HPI.  Physical Exam: There were no vitals filed for this visit. *** Assessment & Plan:   See Encounters Tab for problem based charting.  Patient {GC/GE:3044014::"discussed with","seen with"} Dr. {NAMES:[id number]::"Butcher","Granfortuna","E. Hoffman","Guilloud","Mullen","Narendra","Raines","Vincent"}

## 2020-04-21 ENCOUNTER — Other Ambulatory Visit: Payer: Self-pay | Admitting: Internal Medicine

## 2020-04-27 ENCOUNTER — Encounter: Payer: No Typology Code available for payment source | Admitting: Internal Medicine

## 2020-05-22 ENCOUNTER — Other Ambulatory Visit: Payer: Self-pay

## 2020-05-22 ENCOUNTER — Encounter (HOSPITAL_COMMUNITY): Payer: Self-pay | Admitting: Emergency Medicine

## 2020-05-22 ENCOUNTER — Inpatient Hospital Stay (HOSPITAL_COMMUNITY)
Admission: EM | Admit: 2020-05-22 | Discharge: 2020-05-26 | DRG: 286 | Disposition: A | Discharge status: Home / Self Care | Payer: No Typology Code available for payment source | Attending: Family Medicine | Admitting: Family Medicine

## 2020-05-22 DIAGNOSIS — J9621 Acute and chronic respiratory failure with hypoxia: Secondary | ICD-10-CM | POA: Diagnosis present

## 2020-05-22 DIAGNOSIS — Q336 Congenital hypoplasia and dysplasia of lung: Secondary | ICD-10-CM

## 2020-05-22 DIAGNOSIS — I10 Essential (primary) hypertension: Secondary | ICD-10-CM

## 2020-05-22 DIAGNOSIS — I110 Hypertensive heart disease with heart failure: Secondary | ICD-10-CM | POA: Diagnosis not present

## 2020-05-22 DIAGNOSIS — I248 Other forms of acute ischemic heart disease: Secondary | ICD-10-CM | POA: Diagnosis present

## 2020-05-22 DIAGNOSIS — R0602 Shortness of breath: Secondary | ICD-10-CM

## 2020-05-22 DIAGNOSIS — E662 Morbid (severe) obesity with alveolar hypoventilation: Secondary | ICD-10-CM | POA: Diagnosis present

## 2020-05-22 DIAGNOSIS — E119 Type 2 diabetes mellitus without complications: Secondary | ICD-10-CM | POA: Diagnosis present

## 2020-05-22 DIAGNOSIS — Z6841 Body Mass Index (BMI) 40.0 and over, adult: Secondary | ICD-10-CM

## 2020-05-22 DIAGNOSIS — I2721 Secondary pulmonary arterial hypertension: Secondary | ICD-10-CM | POA: Diagnosis present

## 2020-05-22 DIAGNOSIS — R778 Other specified abnormalities of plasma proteins: Secondary | ICD-10-CM

## 2020-05-22 DIAGNOSIS — R0902 Hypoxemia: Secondary | ICD-10-CM

## 2020-05-22 DIAGNOSIS — Q2579 Other congenital malformations of pulmonary artery: Secondary | ICD-10-CM

## 2020-05-22 DIAGNOSIS — R079 Chest pain, unspecified: Secondary | ICD-10-CM

## 2020-05-22 DIAGNOSIS — Z833 Family history of diabetes mellitus: Secondary | ICD-10-CM

## 2020-05-22 DIAGNOSIS — Z79899 Other long term (current) drug therapy: Secondary | ICD-10-CM

## 2020-05-22 DIAGNOSIS — I50811 Acute right heart failure: Secondary | ICD-10-CM | POA: Diagnosis present

## 2020-05-22 DIAGNOSIS — Z20822 Contact with and (suspected) exposure to covid-19: Secondary | ICD-10-CM | POA: Diagnosis present

## 2020-05-22 DIAGNOSIS — Z9981 Dependence on supplemental oxygen: Secondary | ICD-10-CM

## 2020-05-22 DIAGNOSIS — F902 Attention-deficit hyperactivity disorder, combined type: Secondary | ICD-10-CM | POA: Diagnosis present

## 2020-05-22 DIAGNOSIS — E039 Hypothyroidism, unspecified: Secondary | ICD-10-CM | POA: Diagnosis present

## 2020-05-22 DIAGNOSIS — J432 Centrilobular emphysema: Secondary | ICD-10-CM | POA: Diagnosis present

## 2020-05-22 DIAGNOSIS — I50810 Right heart failure, unspecified: Secondary | ICD-10-CM

## 2020-05-22 DIAGNOSIS — Z8249 Family history of ischemic heart disease and other diseases of the circulatory system: Secondary | ICD-10-CM

## 2020-05-22 DIAGNOSIS — J9601 Acute respiratory failure with hypoxia: Secondary | ICD-10-CM

## 2020-05-22 DIAGNOSIS — Z79890 Hormone replacement therapy (postmenopausal): Secondary | ICD-10-CM

## 2020-05-22 DIAGNOSIS — I509 Heart failure, unspecified: Secondary | ICD-10-CM

## 2020-05-22 DIAGNOSIS — I272 Pulmonary hypertension, unspecified: Secondary | ICD-10-CM

## 2020-05-22 DIAGNOSIS — G4733 Obstructive sleep apnea (adult) (pediatric): Secondary | ICD-10-CM

## 2020-05-22 DIAGNOSIS — Z7984 Long term (current) use of oral hypoglycemic drugs: Secondary | ICD-10-CM

## 2020-05-22 DIAGNOSIS — I5033 Acute on chronic diastolic (congestive) heart failure: Secondary | ICD-10-CM | POA: Diagnosis present

## 2020-05-22 DIAGNOSIS — Z7951 Long term (current) use of inhaled steroids: Secondary | ICD-10-CM

## 2020-05-22 HISTORY — DX: Sleep apnea, unspecified: G47.30

## 2020-05-22 HISTORY — DX: Type 2 diabetes mellitus without complications: E11.9

## 2020-05-22 NOTE — ED Notes (Signed)
Pt came in complaining of [facility], when checking in this NT checked his O2 sat and it was 68 on RA. This NT gave the pt oxygen at 6L and he came up to 91.

## 2020-05-22 NOTE — ED Triage Notes (Signed)
Pt reports he has had left sided chest pain that goes into his neck and SOB X2 days.  Pt is maintaining at 95% on 6L O2 via [place].  Pt states "it feels just like I had COVID" earlier this year.  Pt reports fever and dizzy.  "feels like Someone is standing on my chest."

## 2020-05-23 ENCOUNTER — Emergency Department (HOSPITAL_COMMUNITY): Payer: No Typology Code available for payment source

## 2020-05-23 ENCOUNTER — Ambulatory Visit: Payer: Self-pay

## 2020-05-23 ENCOUNTER — Encounter (HOSPITAL_COMMUNITY): Payer: Self-pay | Admitting: Internal Medicine

## 2020-05-23 ENCOUNTER — Other Ambulatory Visit: Payer: Self-pay

## 2020-05-23 DIAGNOSIS — I509 Heart failure, unspecified: Secondary | ICD-10-CM | POA: Diagnosis not present

## 2020-05-23 DIAGNOSIS — J9600 Acute respiratory failure, unspecified whether with hypoxia or hypercapnia: Secondary | ICD-10-CM

## 2020-05-23 DIAGNOSIS — J9621 Acute and chronic respiratory failure with hypoxia: Secondary | ICD-10-CM | POA: Diagnosis present

## 2020-05-23 DIAGNOSIS — G4733 Obstructive sleep apnea (adult) (pediatric): Secondary | ICD-10-CM | POA: Diagnosis not present

## 2020-05-23 DIAGNOSIS — E119 Type 2 diabetes mellitus without complications: Secondary | ICD-10-CM | POA: Diagnosis present

## 2020-05-23 DIAGNOSIS — Z20822 Contact with and (suspected) exposure to covid-19: Secondary | ICD-10-CM | POA: Diagnosis present

## 2020-05-23 DIAGNOSIS — J432 Centrilobular emphysema: Secondary | ICD-10-CM | POA: Diagnosis present

## 2020-05-23 DIAGNOSIS — Z79899 Other long term (current) drug therapy: Secondary | ICD-10-CM | POA: Diagnosis not present

## 2020-05-23 DIAGNOSIS — Q2579 Other congenital malformations of pulmonary artery: Secondary | ICD-10-CM | POA: Diagnosis not present

## 2020-05-23 DIAGNOSIS — I50810 Right heart failure, unspecified: Secondary | ICD-10-CM

## 2020-05-23 DIAGNOSIS — Z6841 Body Mass Index (BMI) 40.0 and over, adult: Secondary | ICD-10-CM | POA: Diagnosis not present

## 2020-05-23 DIAGNOSIS — R079 Chest pain, unspecified: Secondary | ICD-10-CM | POA: Diagnosis not present

## 2020-05-23 DIAGNOSIS — J9601 Acute respiratory failure with hypoxia: Secondary | ICD-10-CM

## 2020-05-23 DIAGNOSIS — I110 Hypertensive heart disease with heart failure: Secondary | ICD-10-CM | POA: Diagnosis present

## 2020-05-23 DIAGNOSIS — Z7984 Long term (current) use of oral hypoglycemic drugs: Secondary | ICD-10-CM | POA: Diagnosis not present

## 2020-05-23 DIAGNOSIS — E039 Hypothyroidism, unspecified: Secondary | ICD-10-CM | POA: Diagnosis present

## 2020-05-23 DIAGNOSIS — Q249 Congenital malformation of heart, unspecified: Secondary | ICD-10-CM

## 2020-05-23 DIAGNOSIS — Z7951 Long term (current) use of inhaled steroids: Secondary | ICD-10-CM | POA: Diagnosis not present

## 2020-05-23 DIAGNOSIS — Z833 Family history of diabetes mellitus: Secondary | ICD-10-CM | POA: Diagnosis not present

## 2020-05-23 DIAGNOSIS — I50811 Acute right heart failure: Secondary | ICD-10-CM | POA: Diagnosis not present

## 2020-05-23 DIAGNOSIS — E662 Morbid (severe) obesity with alveolar hypoventilation: Secondary | ICD-10-CM | POA: Diagnosis present

## 2020-05-23 DIAGNOSIS — R0602 Shortness of breath: Secondary | ICD-10-CM | POA: Diagnosis present

## 2020-05-23 DIAGNOSIS — Z79890 Hormone replacement therapy (postmenopausal): Secondary | ICD-10-CM | POA: Diagnosis not present

## 2020-05-23 DIAGNOSIS — F902 Attention-deficit hyperactivity disorder, combined type: Secondary | ICD-10-CM | POA: Diagnosis present

## 2020-05-23 DIAGNOSIS — I248 Other forms of acute ischemic heart disease: Secondary | ICD-10-CM | POA: Diagnosis present

## 2020-05-23 DIAGNOSIS — I272 Pulmonary hypertension, unspecified: Secondary | ICD-10-CM | POA: Diagnosis not present

## 2020-05-23 DIAGNOSIS — Z8249 Family history of ischemic heart disease and other diseases of the circulatory system: Secondary | ICD-10-CM | POA: Diagnosis not present

## 2020-05-23 DIAGNOSIS — I2721 Secondary pulmonary arterial hypertension: Secondary | ICD-10-CM | POA: Diagnosis present

## 2020-05-23 DIAGNOSIS — I5033 Acute on chronic diastolic (congestive) heart failure: Secondary | ICD-10-CM | POA: Diagnosis present

## 2020-05-23 DIAGNOSIS — Q211 Atrial septal defect: Secondary | ICD-10-CM | POA: Diagnosis not present

## 2020-05-23 DIAGNOSIS — Z9981 Dependence on supplemental oxygen: Secondary | ICD-10-CM | POA: Diagnosis not present

## 2020-05-23 LAB — BASIC METABOLIC PANEL
Anion gap: 10 (ref 5–15)
BUN: 9 mg/dL (ref 6–20)
CO2: 26 mmol/L (ref 22–32)
Calcium: 9.1 mg/dL (ref 8.9–10.3)
Chloride: 104 mmol/L (ref 98–111)
Creatinine, Ser: 1.19 mg/dL (ref 0.61–1.24)
GFR calc Af Amer: >60 mL/min (ref >60)
GFR calc non Af Amer: >60 mL/min (ref >60)
Glucose, Bld: 105 mg/dL — ABNORMAL HIGH (ref 70–99)
Potassium: 4.2 mmol/L (ref 3.5–5.1)
Sodium: 140 mmol/L (ref 135–145)

## 2020-05-23 LAB — PROCALCITONIN: Procalcitonin: <0.1 ng/mL

## 2020-05-23 LAB — TSH: TSH: 6.291 u[IU]/mL — ABNORMAL HIGH (ref 0.350–4.500)

## 2020-05-23 LAB — GLUCOSE, CAPILLARY
Glucose-Capillary: 109 mg/dL — ABNORMAL HIGH (ref 70–99)
Glucose-Capillary: 98 mg/dL (ref 70–99)
Glucose-Capillary: 102 mg/dL — ABNORMAL HIGH (ref 70–99)
Glucose-Capillary: 124 mg/dL — ABNORMAL HIGH (ref 70–99)

## 2020-05-23 LAB — CBC
HCT: 47.4 % (ref 39.0–52.0)
HCT: 45.3 % (ref 39.0–52.0)
Hemoglobin: 14.4 g/dL (ref 13.0–17.0)
Hemoglobin: 13.6 g/dL (ref 13.0–17.0)
MCH: 24.8 pg — ABNORMAL LOW (ref 26.0–34.0)
MCH: 24.7 pg — ABNORMAL LOW (ref 26.0–34.0)
MCHC: 30.4 g/dL (ref 30.0–36.0)
MCHC: 30 g/dL (ref 30.0–36.0)
MCV: 81.6 fL (ref 80.0–100.0)
MCV: 82.4 fL (ref 80.0–100.0)
Platelets: 401 10*3/uL — ABNORMAL HIGH (ref 150–400)
Platelets: 378 10*3/uL (ref 150–400)
RBC: 5.81 MIL/uL (ref 4.22–5.81)
RBC: 5.5 MIL/uL (ref 4.22–5.81)
RDW: 17.3 % — ABNORMAL HIGH (ref 11.5–15.5)
RDW: 16.8 % — ABNORMAL HIGH (ref 11.5–15.5)
WBC: 13.6 10*3/uL — ABNORMAL HIGH (ref 4.0–10.5)
WBC: 11.4 10*3/uL — ABNORMAL HIGH (ref 4.0–10.5)
nRBC: 0 % (ref 0.0–0.2)
nRBC: 0 % (ref 0.0–0.2)

## 2020-05-23 LAB — CREATININE, SERUM
Creatinine, Ser: 1.15 mg/dL (ref 0.61–1.24)
GFR calc Af Amer: >60 mL/min (ref >60)
GFR calc non Af Amer: >60 mL/min (ref >60)

## 2020-05-23 LAB — TROPONIN I (HIGH SENSITIVITY)
Troponin I (High Sensitivity): 120 ng/L (ref <18)
Troponin I (High Sensitivity): 118 ng/L (ref <18)
Troponin I (High Sensitivity): 93 ng/L — ABNORMAL HIGH (ref <18)
Troponin I (High Sensitivity): 78 ng/L — ABNORMAL HIGH (ref <18)

## 2020-05-23 LAB — MAGNESIUM: Magnesium: 1.9 mg/dL (ref 1.7–2.4)

## 2020-05-23 LAB — SARS CORONAVIRUS 2 BY RT PCR (HOSPITAL ORDER, PERFORMED IN ~~LOC~~ HOSPITAL LAB): SARS Coronavirus 2: NEGATIVE

## 2020-05-23 LAB — BRAIN NATRIURETIC PEPTIDE: B Natriuretic Peptide: 279.2 pg/mL — ABNORMAL HIGH (ref 0.0–100.0)

## 2020-05-23 LAB — D-DIMER, QUANTITATIVE: D-Dimer, Quant: 0.63 ug/mL-FEU — ABNORMAL HIGH (ref 0.00–0.50)

## 2020-05-23 MED ORDER — LEVOTHYROXINE SODIUM 75 MCG PO TABS
75.0000 ug | ORAL_TABLET | Freq: Every day | ORAL | Status: DC
  Administered 2020-05-23 – 2020-05-26 (×4): 75 ug via ORAL
  Filled 2020-05-23 (×4): qty 1

## 2020-05-23 MED ORDER — SODIUM CHLORIDE 0.9% FLUSH
3.0000 mL | Freq: Two times a day (BID) | INTRAVENOUS | Status: DC
  Administered 2020-05-23 – 2020-05-24 (×2): 3 mL via INTRAVENOUS

## 2020-05-23 MED ORDER — NITROGLYCERIN 2 % TD OINT
1.0000 [in_us] | TOPICAL_OINTMENT | Freq: Once | TRANSDERMAL | Status: AC
  Administered 2020-05-23: 1 [in_us] via TOPICAL
  Filled 2020-05-23: qty 1

## 2020-05-23 MED ORDER — SPIRONOLACTONE 12.5 MG HALF TABLET
12.5000 mg | ORAL_TABLET | Freq: Every day | ORAL | Status: DC
  Administered 2020-05-23 – 2020-05-26 (×4): 12.5 mg via ORAL
  Filled 2020-05-23 (×4): qty 1

## 2020-05-23 MED ORDER — ALBUTEROL SULFATE (2.5 MG/3ML) 0.083% IN NEBU: 3.0000 mL | INHALATION_SOLUTION | Freq: Four times a day (QID) | RESPIRATORY_TRACT | Status: DC | PRN

## 2020-05-23 MED ORDER — SODIUM CHLORIDE 0.9 % IV SOLN
INTRAVENOUS | Status: DC | PRN
  Administered 2020-05-23: 1000 mL via INTRAVENOUS

## 2020-05-23 MED ORDER — LISINOPRIL 5 MG PO TABS
5.0000 mg | ORAL_TABLET | Freq: Every day | ORAL | Status: DC
  Administered 2020-05-23: 5 mg via ORAL
  Filled 2020-05-23: qty 1

## 2020-05-23 MED ORDER — SODIUM CHLORIDE 0.9 % IV SOLN
100.0000 mg | Freq: Two times a day (BID) | INTRAVENOUS | Status: DC
  Administered 2020-05-23: 100 mg via INTRAVENOUS
  Filled 2020-05-23 (×2): qty 100

## 2020-05-23 MED ORDER — ASPIRIN EC 81 MG PO TBEC
81.0000 mg | DELAYED_RELEASE_TABLET | Freq: Every day | ORAL | Status: DC
  Administered 2020-05-23 – 2020-05-26 (×4): 81 mg via ORAL
  Filled 2020-05-23 (×4): qty 1

## 2020-05-23 MED ORDER — NITROGLYCERIN 0.4 MG/HR TD PT24
0.4000 mg | MEDICATED_PATCH | Freq: Every day | TRANSDERMAL | Status: DC
  Administered 2020-05-23 – 2020-05-25 (×3): 0.4 mg via TRANSDERMAL
  Filled 2020-05-23 (×3): qty 1

## 2020-05-23 MED ORDER — IOHEXOL 350 MG/ML SOLN
100.0000 mL | Freq: Once | INTRAVENOUS | Status: AC | PRN
  Administered 2020-05-23: 100 mL via INTRAVENOUS

## 2020-05-23 MED ORDER — ALBUTEROL SULFATE HFA 108 (90 BASE) MCG/ACT IN AERS
4.0000 | INHALATION_SPRAY | Freq: Once | RESPIRATORY_TRACT | Status: AC
  Administered 2020-05-23: 4 via RESPIRATORY_TRACT
  Filled 2020-05-23: qty 6.7

## 2020-05-23 MED ORDER — HYDROCHLOROTHIAZIDE 12.5 MG PO CAPS
12.5000 mg | ORAL_CAPSULE | Freq: Every day | ORAL | Status: DC
  Administered 2020-05-23: 12.5 mg via ORAL
  Filled 2020-05-23: qty 1

## 2020-05-23 MED ORDER — MOMETASONE FURO-FORMOTEROL FUM 100-5 MCG/ACT IN AERO
2.0000 | INHALATION_SPRAY | Freq: Two times a day (BID) | RESPIRATORY_TRACT | Status: DC
  Administered 2020-05-23 – 2020-05-26 (×7): 2 via RESPIRATORY_TRACT
  Filled 2020-05-23: qty 8.8

## 2020-05-23 MED ORDER — INSULIN ASPART 100 UNIT/ML ~~LOC~~ SOLN: 0.0000 [IU] | Freq: Three times a day (TID) | SUBCUTANEOUS | Status: DC

## 2020-05-23 MED ORDER — ONDANSETRON HCL 4 MG/2ML IJ SOLN: 4.0000 mg | Freq: Four times a day (QID) | INTRAMUSCULAR | Status: DC | PRN

## 2020-05-23 MED ORDER — ONDANSETRON HCL 4 MG PO TABS: 4.0000 mg | ORAL_TABLET | Freq: Four times a day (QID) | ORAL | Status: DC | PRN

## 2020-05-23 MED ORDER — FUROSEMIDE 10 MG/ML IJ SOLN
40.0000 mg | Freq: Once | INTRAMUSCULAR | Status: AC
  Administered 2020-05-23: 40 mg via INTRAVENOUS
  Filled 2020-05-23: qty 4

## 2020-05-23 MED ORDER — DOXYCYCLINE HYCLATE 100 MG PO TABS
100.0000 mg | ORAL_TABLET | Freq: Two times a day (BID) | ORAL | Status: DC
  Administered 2020-05-23 – 2020-05-24 (×2): 100 mg via ORAL
  Filled 2020-05-23 (×2): qty 1

## 2020-05-23 MED ORDER — FUROSEMIDE 10 MG/ML IJ SOLN: 40.0000 mg | Freq: Two times a day (BID) | INTRAMUSCULAR | Status: DC

## 2020-05-23 MED ORDER — FUROSEMIDE 10 MG/ML IJ SOLN
60.0000 mg | Freq: Two times a day (BID) | INTRAMUSCULAR | Status: DC
  Administered 2020-05-23 – 2020-05-25 (×4): 60 mg via INTRAVENOUS
  Filled 2020-05-23 (×4): qty 6

## 2020-05-23 MED ORDER — CARVEDILOL 6.25 MG PO TABS
6.2500 mg | ORAL_TABLET | Freq: Two times a day (BID) | ORAL | Status: DC
  Administered 2020-05-23 – 2020-05-26 (×7): 6.25 mg via ORAL
  Filled 2020-05-23 (×7): qty 1

## 2020-05-23 MED ORDER — HYDRALAZINE HCL 20 MG/ML IJ SOLN: 10.0000 mg | INTRAMUSCULAR | Status: DC | PRN

## 2020-05-23 MED ORDER — AMLODIPINE BESYLATE 10 MG PO TABS
10.0000 mg | ORAL_TABLET | Freq: Every day | ORAL | Status: DC
  Administered 2020-05-23 – 2020-05-26 (×4): 10 mg via ORAL
  Filled 2020-05-23 (×4): qty 1

## 2020-05-23 MED ORDER — ENOXAPARIN SODIUM 40 MG/0.4ML ~~LOC~~ SOLN
40.0000 mg | SUBCUTANEOUS | Status: DC
  Administered 2020-05-23 – 2020-05-24 (×2): 40 mg via SUBCUTANEOUS
  Filled 2020-05-23 (×2): qty 0.4

## 2020-05-23 NOTE — Consult Note (Addendum)
Cardiology Consultation:   Patient ID: Benjamin Rosario MRN: [id number]; DOB: Oct 04, 2000  Admit date: 05/22/2020 Date of Consult: 05/23/2020  Primary Care Provider: Soundra Pilon, FNP [facility] Cardiologist: No primary care provider on file. NEW Has been seen pediatric cardiology at [facility] Electrophysiologist:  None    Patient Profile:   Benjamin Rosario is a 19 y.o. male with a hx of pulmonary stenosis, though not severe enough for meds or procedures and was stable in Jan 06, 2014, HTN, morbid obesity, OSA with CPAP, DM, hypothyroid who is being seen today for the evaluation of chest pain at the request of Dr. Alvino Chapel.  History of Present Illness:   Mr. Benjamin Rosario was last seen in 06-Jan-2014 with pediatric cards at [facility] and reassured that chest pain not from cardiac etiology.  Cards did not feel Benjamin Rosario was at risk for sudden cardiac death.   Normal echo in 2014-01-06, per note no pulmonary stenosis technically, some evidence that valve was previously affected. But residual gradient not significant enough to qualify as stenotic.Marland Kitchen  He had PNA in 2020/01/07 with intubation. Had COVID PNA in 09/2019.  Has unilateral absence of pulmonary Rt congential.  Has CPAP and home 02 he uses prn  Echo 2020-01-07 with EF >75%, mild LVH RV size is normal but RV systolic function is moderately reduced.  No evidence of pulmonic stenosis   Pt presented to ER 05/22/20 with left sided chest pain with radiation into neck and asso. SOB for 2 days.  Pain a significant heaviness.  No nausea and no diaphoresis. He stated it felt like he had COVID like he did earlier this year.  Felt someone standing on his chest.  Yesterday with productive yellow red mucus with cough.    COVID negative,  CTA of chest possible PNA or unilateral edema in Lt lung, dysplastic Rt lung no PE  PCXR:  IMPRESSION: Left lower lung airspace disease concerning for pneumonia.  EKG:  The EKG was personally reviewed and demonstrates:  ST at 109 with Rt ward  axis, no acute ST changes Telemetry:  Telemetry was personally reviewed and demonstrates:  SR to ST  Na 140, K+ 4.2, BUN 9 Cr 1.19 Mg+ 1.9  BNP 279 troponin hs  120; 118; 93; 78 pro calcitonin <0.10  WBC 13.6, Hgb 14.4, plts 401  DDimer 0.63 TSH 6.291  BP 142/86 P 103, R 18 now temp pk 99.6  Has rec'd lasix 40 mg IV and neg 1300  (wt 209.9 Kg)  No chest pain now.  Breathing is easier.   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/25/2018   Environmental allergies    Hypothyroidism 11/25/2018    History reviewed. No pertinent surgical history.   Home Medications:  Prior to Admission medications   Medication Sig Start Date End Date Taking? Authorizing Provider  albuterol (PROVENTIL HFA;VENTOLIN HFA) 108 (90 Base) MCG/ACT inhaler Inhale 2 puffs into the lungs every 6 (six) hours as needed for wheezing or shortness of breath. 11/28/18  Yes Purohit, Shrey C, MD  amLODipine (NORVASC) 10 MG tablet Take 1 tablet (10 mg total) by mouth daily. 06/15/19  Yes Almon Hercules, MD  carvedilol (COREG) 6.25 MG tablet Take 1 tablet (6.25 mg total) by mouth 2 (two) times daily with a meal. 12/16/19  Yes Kirt Boys, MD  hydrochlorothiazide (MICROZIDE) 12.5 MG capsule Take 1 capsule (12.5 mg total)  by mouth daily. 12/16/19  Yes Kirt Boys, MD  ibuprofen (ADVIL) 200 MG tablet Take 600-800 mg by mouth every 8 (eight) hours as needed for headache or moderate pain.   Yes [provider]  levothyroxine (SYNTHROID) 75 MCG tablet Take 1 tablet (75 mcg total) by mouth daily before breakfast. 12/16/19  Yes Kirt Boys, MD  lisinopril (ZESTRIL) 5 MG tablet Take 1 tablet (5 mg total) by mouth daily. 12/23/19 05/23/20 Yes Thom Chimes, MD  mometasone-formoterol (DULERA) 100-5 MCG/ACT AERO Inhale 2 puffs into the lungs 2 (two) times daily. Patient taking differently: Inhale 2 puffs into the  lungs 2 (two) times daily as needed for wheezing or shortness of breath.  06/09/19  Yes Rai, Ripudeep K, MD  metFORMIN (GLUCOPHAGE) 500 MG tablet Take 1 tablet (500 mg total) by mouth 2 (two) times daily with a meal. 12/16/19 12/15/20  Kirt Boys, MD    Inpatient Medications: Scheduled Meds:  amLODipine  10 mg Oral Daily   aspirin EC  81 mg Oral Daily   carvedilol  6.25 mg Oral BID WC   enoxaparin (LOVENOX) injection  40 mg Subcutaneous Q24H   furosemide  40 mg Intravenous Q12H   hydrochlorothiazide  12.5 mg Oral Daily   insulin aspart  0-9 Units Subcutaneous TID WC   levothyroxine  75 mcg Oral QAC breakfast   lisinopril  5 mg Oral Daily   mometasone-formoterol  2 puff Inhalation BID   nitroGLYCERIN  0.4 mg Transdermal Daily   Continuous Infusions:  sodium chloride 1,000 mL (05/23/20 0926)   doxycycline (VIBRAMYCIN) IV 100 mg (05/23/20 0928)   PRN Meds: sodium chloride, albuterol, hydrALAZINE, ondansetron **OR** ondansetron (ZOFRAN) IV  Allergies:   No Known Allergies  Social History:   Social History   Socioeconomic History   Marital status: Single    Spouse name: Not on file   Number of children: Not on file   Years of education: Not on file   Highest education level: Not on file  Occupational History   Not on file  Tobacco Use   Smoking status: Never Smoker   Smokeless tobacco: Never Used  Vaping Use   Vaping Use: Never used  Substance and Sexual Activity   Alcohol use: No    Alcohol/week: 0.0 standard drinks   Drug use: Yes    Types: Marijuana   Sexual activity: Never  Other Topics Concern   Not on file  Social History Narrative   Not on file   Social Determinants of Health   Financial Resource Strain:    Difficulty of Paying Living Expenses: Not on file  Food Insecurity:    Worried About Running Out of Food in the Last Year: Not on file   [employer] of Food in the Last Year: Not on file  Transportation Needs:    Lack of Transportation (Medical): Not  on file   Lack of Transportation (Non-Medical): Not on file  Physical Activity:    Days of Exercise per Week: Not on file   Minutes of Exercise per Session: Not on file  Stress:    Feeling of Stress : Not on file  Social Connections:    Frequency of Communication with Friends and Family: Not on file   Frequency of Social Gatherings with Friends and Family: Not on file   Attends Religious Services: Not on file   Active Member of Clubs or Organizations: Not on file   Attends Banker Meetings: Not on file   Marital Status:  Not on file  Intimate Partner Violence:    Fear of Current or Ex-Partner: Not on file   Emotionally Abused: Not on file   Physically Abused: Not on file   Sexually Abused: Not on file    Family History:    Family History  Problem Relation Age of Onset   Hypertension Father    Diabetes Father      ROS:  Please see the history of present illness.  General:no colds or fevers, no weight changes Skin:no rashes or ulcers HEENT:no blurred vision, no congestion CV:see HPI PUL:see HPI GI:no diarrhea constipation or melena, no indigestion GU:no hematuria, no dysuria MS:no joint pain, no claudication Neuro:no syncope, no lightheadedness Endo:+ diabetes, + thyroid disease + sleep apnea  All other ROS reviewed and negative.     Physical Exam/Data:   Vitals:   05/23/20 0730 05/23/20 0813 05/23/20 0843 05/23/20 1116  BP: (!) 146/84  (!) 142/86 (!) 143/71  Pulse: 96  98 (!) 101  Resp: (!) 38  18 (!) 23  Temp:  98.7 F (37.1 C) 98.2 F (36.8 C) 98.2 F (36.8 C)  TempSrc:  Oral Oral   SpO2: 99%  98% (!) 82%  Weight:   (!) 209.9 kg   Height:   6\' 1"  (1.854 m)     Intake/Output Summary (Last 24 hours) at 05/23/2020 1117 Last data filed at 05/23/2020 0457 Gross per 24 hour  Intake --  Output 1300 ml  Net -1300 ml   Last 3 Weights 05/23/2020 05/22/2020 02/07/2020  Weight (lbs) 462 lb 12.8 oz 460 lb 452 lb  Weight (kg) 209.925 kg 208.655 kg  205.026 kg  Some encounter information is confidential and restricted. Go to Review Flowsheets activity to see all data.     Body mass index is 61.06 kg/m.  General:  Obese male, in no acute distress HEENT: normal Lymph: no adenopathy Neck: no JVD sitting up Endocrine:  No thryomegaly Vascular: No carotid bruits; pedal pulses 2+ bilaterally  Cardiac:  normal S1, S2; RRR; no murmur gallup rub or click Lungs:  diminished to auscultation bilaterally, no wheezing, rhonchi or rales  Abd: soft, nontender, no hepatomegaly  Ext: + edema of feet, no pitting edema of legs or back Musculoskeletal:  No deformities, BUE and BLE strength normal and equal Skin: warm and dry  Neuro:  Alert and oriented X 3 MAE follows commands no focal abnormalities noted Psych:  Normal affect    Relevant CV Studies: Echo 12/2019 IMPRESSIONS     1. Technically difficult; vigorous LV systolic function; mild LVH; RV not  well visualized but function appears to be reduced.   2. Left ventricular ejection fraction, by estimation, is >75%. The left  ventricle has hyperdynamic function. The left ventricle has no regional  wall motion abnormalities. There is mild left ventricular hypertrophy.  Left ventricular diastolic parameters  were normal.   3. Right ventricular systolic function is moderately reduced. The right  ventricular size is normal.   4. The mitral valve is normal in structure. No evidence of mitral valve  regurgitation. No evidence of mitral stenosis.   5. The aortic valve is normal in structure. Aortic valve regurgitation is  not visualized. No aortic stenosis is present.   FINDINGS   Left Ventricle: Left ventricular ejection fraction, by estimation, is  >75%. The left ventricle has hyperdynamic function. The left ventricle has  no regional wall motion abnormalities. The left ventricular internal  cavity size was normal in size. There  is  mild left ventricular hypertrophy. Left ventricular  diastolic  parameters were normal.   Right Ventricle: The right ventricular size is normal. Right ventricular  systolic function is moderately reduced.   Left Atrium: Left atrial size was normal in size.   Right Atrium: Right atrial size was normal in size.   Pericardium: There is no evidence of pericardial effusion.   Mitral Valve: The mitral valve is normal in structure. Normal mobility of  the mitral valve leaflets. No evidence of mitral valve regurgitation. No  evidence of mitral valve stenosis.   Tricuspid Valve: The tricuspid valve is normal in structure. Tricuspid  valve regurgitation is trivial. No evidence of tricuspid stenosis.   Aortic Valve: The aortic valve is normal in structure. Aortic valve  regurgitation is not visualized. No aortic stenosis is present.   Pulmonic Valve: The pulmonic valve was not well visualized. Pulmonic valve  regurgitation is not visualized. No evidence of pulmonic stenosis.   Aorta: The aortic root is normal in size and structure.   Venous: The inferior vena cava was not well visualized.     Additional Comments: Technically difficult; vigorous LV systolic function;  mild LVH; RV not well visualized but function appears to be reduced.   Laboratory Data:  High Sensitivity Troponin:   Recent Labs  Lab 05/23/20 0032 05/23/20 0152 05/23/20 0607 05/23/20 0905  TROPONINIHS 120* 118* 93* 78*     Chemistry Recent Labs  Lab 05/23/20 0032 05/23/20 0607  NA 140  --   K 4.2  --   CL 104  --   CO2 26  --   GLUCOSE 105*  --   BUN 9  --   CREATININE 1.19 1.15  CALCIUM 9.1  --   GFRNONAA >60 >60  GFRAA >60 >60  ANIONGAP 10  --     No results for input(s): PROT, ALBUMIN, AST, ALT, ALKPHOS, BILITOT in the last 168 hours. Hematology Recent Labs  Lab 05/23/20 0032 05/23/20 0607  WBC 13.6* 11.4*  RBC 5.81 5.50  HGB 14.4 13.6  HCT 47.4 45.3  MCV 81.6 82.4  MCH 24.8* 24.7*  MCHC 30.4 30.0  RDW 17.3* 16.8*  PLT 401* 378    BNP Recent Labs  Lab 05/23/20 0032  BNP 279.2*    DDimer  Recent Labs  Lab 05/23/20 0125  DDIMER 0.63*     Radiology/Studies:  CT Angio Chest PE W and/or Wo Contrast  Result Date: 05/23/2020 CLINICAL DATA:  Shortness of breath and positive D-dimer. EXAM: CT ANGIOGRAPHY CHEST WITH CONTRAST TECHNIQUE: Multidetector CT imaging of the chest was performed using the standard protocol during bolus administration of intravenous contrast. Multiplanar CT image reconstructions and MIPs were obtained to evaluate the vascular anatomy. CONTRAST:  OMNIPAQUE IOHEXOL 350 MG/ML SOLN COMPARISON:  12/10/2019 FINDINGS: Cardiovascular: Normal heart size. No pericardial effusion. Suboptimal pulmonary artery opacification due to primarily to streak artifact and body habitus. No pulmonary filling defects are seen. 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 Mediastinum/Nodes: Negative for adenopathy or mass. Lungs/Pleura: Hypoplastic right lung with emphysematous spaces. Ground-glass opacity in the left upper and lower lobes with areas of air trapping causing mosaic attenuation. No effusion or pneumothorax. Upper Abdomen: Negative Musculoskeletal: No acute or aggressive finding. Review of the MIP images confirms the above findings. IMPRESSION: 1. Patchy airspace disease in the left lung which could reflect infection or unilateral edema in the setting of absent right pulmonary arteries and veins. 2. Dysplastic  right lung. 3. No evidence of pulmonary embolism. Electronically Signed   By: Marnee SpringJonathon  Watts M.D.   On: 05/23/2020 04:50   DG Chest Port 1 View  Result Date: 05/23/2020 CLINICAL DATA:  Left-sided chest pain EXAM: PORTABLE CHEST 1 VIEW COMPARISON:  12/13/2019 FINDINGS: Right lung is grossly clear. Left lower lung airspace disease concerning for pneumonia. Mild cardiomegaly. No pneumothorax. IMPRESSION: Left lower lung airspace disease  concerning for pneumonia. Electronically Signed   By: Jasmine PangKim  Fujinaga M.D.   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  Though concern for PNA is present.  Has diuresed 1300 after 40 mg Lasix BNP is up somewhat.  Continue to diuresis, pt is feeling better.  Will defer to Dr. Eden EmmsNishan on repeat echo.  Will ask pulmonary to see.   Elevated flat troponin - has been similar in past.  Hx of congential absence of Rt pulmonary artery, no pulmonary stenosis .  Plan Rt heart cath as outpt or when improved. Morbid obesity with 3rd hospitalization this year for SOB. In Jan + COVID, April on Vent for PNA  ( tries to eat healthy, does most of his cooking and is working [facility] at [employer] + tobacco/marijuana use -has stopped both, + emphysema OSA on CPAP and 02 prn Chronic respiratory failure followed by pulmonary as outpt.  DM-2 per iM Hypothyroid per IM      For questions or updates, please contact [facility] Please consult [email or web address] for contact info under    Signed, Nada BoozerLaura Ingold, NP  05/23/2020 11:17 AM   Patient examined chart reviewed Discussed care [place], [place] and Dr Teressa LowerBensimohn.  Exam with obese male in no distress Rhonchi left lung no wheezing BS positive obese with plus one edema. Admitted with recurrent respiratory failure ? Pneumonia on top of chronic COPD from smoking and obesity hypoventilation syndrome.  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 He has normal cardiac valves and normal LV function by multiple echos as a young adult most recently here in April. He has pneumonia on this admission Troponins mildly elevated as usual with no trend And normal ECG. Echo does show moderate RV enlargement. 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 From cardiac perspective only thing to offer is right heart cath. If PCWP low and PVR high may consider off label vasodilator Rx Discussed with Dr Teressa LowerBensimohn and tentatively plan on doing right heart cath Wednesday Risks discussed with patient but minimal with no contrast and no likely need for arterial stick   Charlton HawsPeter Cuca Benassi MD [facility]

## 2020-05-23 NOTE — Consult Note (Addendum)
Advanced Heart Failure Team Consult Note   Primary Physician: Benjamin Rosario, Benjamin R, FNP PCP-Cardiologist:  No primary care provider on file.  Pulmonary: Benjamin Rosario  Reason for Consultation: Pulmonary Hypertension   HPI:    Benjamin Rosario is seen today for evaluation of pulmonary hypertension at the request of Benjamin Rosario.   Benjamin Rosario is a 19 year old with history of pulmonary stenosis, OSA with CPAP, morbid obesity, HTN, DM, and hypothyroidism.  Has unilateral pulmonary right Had COVID PNA 09/2019.  On 2-4 liters of oxygen chronically.   Admitted 12/2019 with CAP/Acute Hypoxic Respiratory Failure. Required intubation. Completed antibiotics. Discharged on 2 liters Benjamin Rosario.   [facility]  with increased shortness of breath/chest pain x4 days. CXR with possible pneumonia. CTA shows edema versus pneumonia. Hypertensive in the ED.  Placed on 6 liters oxygen. Given IV lasix and nitroglycerin.   Pertinent admission labs included: COVID negative, sodium 140, Rosario 4.2, WBC 13.6, DDimer 0.63, TSH 6.3, HS Trop 161>09>[id number]>93>78.   CTA 1. Patchy airspace disease in the left lung which could reflect infection or unilateral edema in the setting of absent right pulmonary arteries and veins. 2. Dysplastic right lung. 3. No evidence of pulmonary embolism.  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, this consultation findings has been reported in the  literature and may represent with recurrent infection and hemoptysis, emphysema, there are scattered small pulmonary nodules  12/10/2019-CTA chest-right lung demonstrate significant emphysematous changes and volume loss consistent with the absent pulmonary artery, these changes are stable from prior exam, left lung is well aerated and demonstrates patchy infiltrates throughout the left lung, this is consistent with that seen on a recent chest x-ray consistent with multifocal pneumonia, additionally there may be some sequela from patient's known COVID-19 infection Benjamin Rosario, somewhat limited exam for PE as described above.  Echo 12/2019 - LV >75%, RV moderately reduced, MV normal, AV normal  Review of Systems: [y] = yes, [ ]  = no   . General: Weight gain [ ] ; Weight loss [ ] ; Anorexia [ ] ; Fatigue [ Y]; Fever [ ] ; Chills [ ] ; Weakness [ ]   . Cardiac: Chest pain/pressure [ ] ; Resting SOB [ ] ; Exertional SOB [Y ]; Orthopnea [ Y]; Pedal Edema [ ] ; Palpitations [ ] ; Syncope [ ] ; Presyncope [ ] ; Paroxysmal nocturnal dyspnea[ ]   . Pulmonary: Cough [ ] ; Wheezing[ ] ; Hemoptysis[ ] ; Sputum [ ] ; Snoring [ ]   . GI: Vomiting[ ] ; Dysphagia[ ] ; Melena[ ] ; Hematochezia [ ] ; Heartburn[ ] ; Abdominal pain [ ] ; Constipation [ ] ; Diarrhea [ ] ; BRBPR [ ]   . GU: Hematuria[ ] ; Dysuria [ ] ; Nocturia[ ]   . Vascular: Pain in legs with walking [ ] ; Pain in feet with lying flat [ ] ; Non-healing sores [ ] ; Stroke [ ] ; TIA [ ] ; Slurred speech [ ] ;  . Neuro: Headaches[ ] ; Vertigo[ ] ; Seizures[ ] ; Paresthesias[ ] ;Blurred vision [ ] ; Diplopia [ ] ; Vision changes [ ]   . Ortho/Skin: Arthritis [ ] ; Joint pain [Y ]; Muscle pain [ ] ; Joint swelling [ ] ; Back  Pain [Y ]; Rash [ ]   . Psych: Depression[ Y]; Anxiety[ ]   . Heme: Bleeding problems [ ] ; Clotting disorders [ ] ; Anemia [ ]   . Endocrine: Diabetes [Y ]; Thyroid dysfunction[Y ]  Home Medications Prior to Admission medications   Medication Sig Start Date End Date Taking? Authorizing  Provider  albuterol (PROVENTIL HFA;VENTOLIN HFA) 108 (90 Base) MCG/ACT inhaler Inhale 2 puffs into the lungs every 6 (six) hours as needed for wheezing or shortness of breath. 11/28/18  Yes Benjamin Rosario, Benjamin C, MD  amLODipine (NORVASC) 10 MG tablet Take 1 tablet (10 mg total) by mouth daily. 06/15/19  Yes , MD  carvedilol (COREG) 6.25 MG tablet Take 1 tablet (6.25 mg total) by mouth 2 (two) times daily with a meal. 12/16/19  Yes 11/30/18, MD  hydrochlorothiazide (MICROZIDE) 12.5 MG capsule Take 1 capsule (12.5 mg total) by mouth daily. 12/16/19  Yes Benjamin Hercules, MD  ibuprofen (ADVIL) 200 MG tablet Take 600-800 mg by mouth every 8 (eight) hours as needed for headache or moderate pain.   Yes [provider]  levothyroxine (SYNTHROID) 75 MCG tablet Take 1 tablet (75 mcg total) by mouth daily before breakfast. 12/16/19  Yes Benjamin Boys, MD  lisinopril (ZESTRIL) 5 MG tablet Take 1 tablet (5 mg total) by mouth daily. 12/23/19 05/23/20 Yes 02/15/20, MD  mometasone-formoterol (DULERA) 100-5 MCG/ACT AERO Inhale 2 puffs into the lungs 2 (two) times daily. Patient taking differently: Inhale 2 puffs into the lungs 2 (two) times daily as needed for wheezing or shortness of breath.  06/09/19  Yes Rai, Benjamin K, MD  metFORMIN (GLUCOPHAGE) 500 MG tablet Take 1 tablet (500 mg total) by mouth 2 (two) times daily with a meal. 12/16/19 12/15/20  Benjamin Chimes, MD    Past Medical History: Past Medical History:  Diagnosis Date  . ADHD (attention deficit hyperactivity disorder)   . Asthma   . Chlamydia 11/25/2018  . Emphysema of lung (HCC) 11/25/2018  . Environmental allergies   . Hypothyroidism 11/25/2018    Past Surgical History: History reviewed. No pertinent surgical history.  Family History: Family History  Problem Relation Age of Onset  . Hypertension Father   . Diabetes Father     Social History: Social History   Socioeconomic History  . Marital status: Single     Spouse name: Not on file  . Number of children: Not on file  . Years of education: Not on file  . Highest education level: Not on file  Occupational History  . Not on file  Tobacco Use  . Smoking status: Never Smoker  . Smokeless tobacco: Never Used  Vaping Use  . Vaping Use: Never used  Substance and Sexual Activity  . Alcohol use: No    Alcohol/week: 0.0 standard drinks  . Drug use: Yes    Types: Marijuana  . Sexual activity: Never  Other Topics Concern  . Not on file  Social History Narrative  . Not on file   Social Determinants of Health   Financial Resource Strain:   . Difficulty of Paying Living Expenses: Not on file  Food Insecurity:   . Worried About Benjamin Rosario in the Last Year: Not on file  . Ran Out of Food in the Last Year: Not on file  Transportation Needs:   . Lack of Transportation (Medical): Not on file  . Lack of Transportation (Non-Medical): Not on file  Physical Activity:   . Days of Exercise per Week: Not  on file  . Minutes of Exercise per Session: Not on file  Stress:   . Feeling of Stress : Not on file  Social Connections:   . Frequency of Communication with Friends and Family: Not on file  . Frequency of Social Gatherings with Friends and Family: Not on file  . Attends Religious Services: Not on file  . Active Member of Clubs or Organizations: Not on file  . Attends Banker Meetings: Not on file  . Marital Status: Not on file    Allergies:  No Known Allergies  Objective:    Vital Signs:   Temp:  [98.2 F (36.8 Rosario)-99.6 F (37.6 Rosario)] 98.2 F (36.8 Rosario) (09/13 1116) Pulse Rate:  [95-114] 101 (09/13 1116) Resp:  [18-40] 18 (09/13 1116) BP: (116-169)/(58-113) 143/71 (09/13 1116) SpO2:  [76 %-100 %] 98 % (09/13 1116) FiO2 (%):  [100 %] 100 % (09/13 0639) Weight:  [208.7 kg-209.9 kg] 209.9 kg (09/13 0843)    Weight change: Filed Weights   05/22/20 2354 05/23/20 0843  Weight: (!) 208.7 kg (!) 209.9 kg     Intake/Output:   Intake/Output Summary (Last 24 hours) at 05/23/2020 1319 Last data filed at 05/23/2020 0457 Gross per 24 hour  Intake --  Output 1300 ml  Net -1300 ml      Physical Exam    General:  No resp difficulty HEENT: normal Neck: supple. JVP difficult to assess due to  Body habitus.  Carotids 2+ bilat; no bruits. No lymphadenopathy or thyromegaly appreciated. Cor: PMI nondisplaced. Regular rate & rhythm. No rubs, gallops or murmurs. Lungs: clear, on NRB Abdomen: obese, soft, nontender, nondistended. No hepatosplenomegaly. No bruits or masses. Good bowel sounds. Extremities: no cyanosis, clubbing, rash, Rosario and LLE 1+ edema Neuro: alert & orientedx3, cranial nerves grossly intact. moves all 4 extremities w/o difficulty. Affect pleasant   Telemetry  SR-ST 90-100s    EKG    ST 109 bpm   Labs   Basic Metabolic Panel: Recent Labs  Lab 05/23/20 0032 05/23/20 0607  NA 140  --   Rosario 4.2  --   CL 104  --   CO2 26  --   GLUCOSE 105*  --   BUN 9  --   CREATININE 1.19 1.15  CALCIUM 9.1  --   MG  --  1.9    Liver Function Tests: No results for input(s): AST, ALT, ALKPHOS, BILITOT, PROT, ALBUMIN in the last 168 hours. No results for input(s): LIPASE, AMYLASE in the last 168 hours. No results for input(s): AMMONIA in the last 168 hours.  CBC: Recent Labs  Lab 05/23/20 0032 05/23/20 0607  WBC 13.6* 11.4*  HGB 14.4 13.6  HCT 47.4 45.3  MCV 81.6 82.4  PLT 401* 378    Cardiac Enzymes: No results for input(s): CKTOTAL, CKMB, CKMBINDEX, TROPONINI in the last 168 hours.  BNP: BNP (last 3 results) Recent Labs    06/08/19 1350 12/11/19 0349 05/23/20 0032  BNP 87.4 108.2* 279.2*    ProBNP (last 3 results) No results for input(s): PROBNP in the last 8760 hours.   CBG: Recent Labs  Lab 05/23/20 0850 05/23/20 1119  GLUCAP 109* 98    Coagulation Studies: No results for input(s): LABPROT, INR in the last 72 hours.   Imaging   CT Angio Chest  PE W and/or Wo Contrast  Result Date: 05/23/2020 CLINICAL DATA:  Shortness of breath and positive D-dimer. EXAM: CT ANGIOGRAPHY CHEST WITH CONTRAST TECHNIQUE: Multidetector CT imaging of  the chest was performed using the standard protocol during bolus administration of intravenous contrast. Multiplanar CT image reconstructions and MIPs were obtained to evaluate the vascular anatomy. CONTRAST:  OMNIPAQUE IOHEXOL 350 MG/ML SOLN COMPARISON:  12/10/2019 FINDINGS: Cardiovascular: Normal heart size. No pericardial effusion. Suboptimal pulmonary artery opacification due to primarily to streak artifact and body habitus. No pulmonary filling defects are seen. 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 Mediastinum/Nodes: Negative for adenopathy or mass. Lungs/Pleura: Hypoplastic right lung with emphysematous spaces. Ground-glass opacity in the left upper and lower lobes with areas of air trapping causing mosaic attenuation. No effusion or pneumothorax. Upper Abdomen: Negative Musculoskeletal: No acute or aggressive finding. Review of the MIP images confirms the above findings. IMPRESSION: 1. Patchy airspace disease in the left lung which could reflect infection or unilateral edema in the setting of absent right pulmonary arteries and veins. 2. Dysplastic right lung. 3. No evidence of pulmonary embolism. Electronically Signed   By: Marnee Spring M.D.   On: 05/23/2020 04:50   DG Chest Port 1 View  Result Date: 05/23/2020 CLINICAL DATA:  Left-sided chest pain EXAM: PORTABLE CHEST 1 VIEW COMPARISON:  12/13/2019 FINDINGS: Right lung is grossly clear. Left lower lung airspace disease concerning for pneumonia. Mild cardiomegaly. No pneumothorax. IMPRESSION: Left lower lung airspace disease concerning for pneumonia. Electronically Signed   By: Jasmine Pang M.D.   On: 05/23/2020 00:51      Medications:     Current Medications: . amLODipine  10  mg Oral Daily  . aspirin EC  81 mg Oral Daily  . carvedilol  6.25 mg Oral BID WC  . doxycycline  100 mg Oral Q12H  . enoxaparin (LOVENOX) injection  40 mg Subcutaneous Q24H  . furosemide  40 mg Intravenous Q12H  . insulin aspart  0-9 Units Subcutaneous TID WC  . levothyroxine  75 mcg Oral QAC breakfast  . lisinopril  5 mg Oral Daily  . mometasone-formoterol  2 puff Inhalation BID  . nitroGLYCERIN  0.4 mg Transdermal Daily     Infusions: . sodium chloride 1,000 mL (05/23/20 0926)       Assessment/Plan   1. Acute Hypoxic Respiratory Failure, multifactorial with emphysema/OSA/PNA/RV failure  - WBC 13>11 - Repeat ECHO.  -Hypoxic on admit . Oxygen increased to 6 liters on admit.  -Diuresing with IV lasix. Volume satus difficult to assess due to body habitus.  - Will need RHC to further assess hemodynamics.  - Increase lasix to 60 mg twice a day.  - Add 12.5 mg spiro.   2. HTN  Improved.  Continue home meds but will stop lisinopril. May be able to switch to entresto.    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    4. Hypothyroidisim  TSH 6.2, free T3, T4 pending.  On levothyroxine.   5. Obesity.  Body mass index is 61.06 kg/m.   6. OSA Continue CPAP.   Length of Stay: 0  Benjamin Becket, NP  05/23/2020, 1:19 PM  Advanced Heart Failure Team Pager [phone] (M-F; 7a - 4p)  Please contact [facility] for night-coverage after hours (4p -7a ) and weekends on [email or web address]  Patient seen with NP, agree with the above note.   Patient has history of OHS/OSA as well as congenital absence of the right PA, on home oxygen and CPAP.  He was said to have pulmonic stenosis in hte past, but there is no evidence for this by echo.  Most recent echo in 4/21 showed normal LV EF with moderately dilated/moderately dysfunctional RV and D-shaped septum.    He was admitted with dyspnea and CTA chest was done => no PE but possible left lung multifocal PNA.  He was started on  doxycycline.  He is also getting Lasix 40 mg IV bid.  He is still requiring more oxygen than his baseline.   General: NAD, obese.  Neck: Thick, JVP difficult, no thyromegaly or thyroid nodule.  Lungs: Decreased bilaterally.  CV: Nondisplaced PMI.  Heart regular S1/S2, no S3/S4, no murmur.  1+ ankle edema.  No carotid bruit.  Normal pedal pulses.  Abdomen: Soft, nontender, no hepatosplenomegaly, no distention.  Skin: Intact without lesions or rashes.  Neurologic: Alert and oriented x 3.  Psych: Normal affect. Extremities: No clubbing or cyanosis.  HEENT: Normal.   1. 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  The pulmonary veins appear to lead to the left atrium on CTA this admission though the right pulmonary veins are small.  No hemoptysis.  He presents with possible PNA but actually appears to be in the left lung by CTA.  Can develop endothelial dysfunction with pulmonary hypertension in setting of UAPA, patient has dilated/dysfunctional RV on 4/21 echo.  I do not see evidence for pulmonary valve stenosis on 4/21 echo.  2. Pulmonary hypertension/RV failure: Echo in 4/21 with normal LV systolic function, moderately dilated/dysfunctional RV with d-shaped septum.  As above, Can develop endothelial dysfunction with pulmonary hypertension in setting of UAPA (group 1 PH).  However, patient also has OHS/OSA which can cause pulmonary hypertension/RV failure (group 3 PH).  Exam is difficult for volume, suspect volume overload.  - Increase Lasix to 60 mg IV bid.  - Continue home oxygen + CPAP.  - Will arrange for RHC when he is more diuresed and oxygen requirement is back to baseline, put on schedule for Wednesday.  Some patients with UAPA appear to have benefits from pulmonary vasodilators in setting of PAH.  3. HTN: Continue amlodipine, Coreg.  Will hold lisinopril for now.  Adding spironolactone 12.5 mg daily.  4. ID: Possible left lung PNA.   No fever, WBCs mildly elevated.  - Covering with doxycycline.   5. OHS/OSA: On 3L home oxygen and CPAP at home since 3/21.   Benjamin Rosario 05/23/2020 2:31 PM

## 2020-05-23 NOTE — ED Notes (Signed)
Lab will add on BNP to pt lab work

## 2020-05-23 NOTE — Progress Notes (Addendum)
Pt requested to have the non-rebreather mask place instead nasal canula because he was going to take a nap and was also feeling SOB.  Will page MD to make aware. RT paged to bring CPAP at bedside.

## 2020-05-23 NOTE — Consult Note (Signed)
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 Poor CPAP compliance - wears it 2 night s a week.  He had COVID-19 pneumonia in January 2021 with associated chronic hypoxic respiratory failure - he has O2, uses prn based on how he feels.  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  He presented to the ED 9/12 with hypertension, dyspnea, chest discomfort, increased oxygen need, 6 L/min. He was diuresed, received NTG.   Past Medical History   Past Medical History:  Diagnosis Date  . ADHD (attention deficit hyperactivity disorder)   . Asthma   . Chlamydia 11/25/2018  . Emphysema of lung (HCC) 11/25/2018  . Environmental allergies   . Hypothyroidism 11/25/2018    Significant Hospital Events     Consults:  Cardiology   Procedures:    Significant Diagnostic Tests:  CT-PA 6/13 >> congenitally absent right pulmonary artery.  No PE.  The right and left pulmonary veins appear to empty into the left atrium.  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  Left diffuse groundglass opacities without effusion or pneumothorax.  Micro Data:  SARSCoV2 9/12 >> negative  Antimicrobials:  Doxycycline 9/13 >>   Interim history/subjective:   Feels much better - SOB and cough resolved, weaned to RA   Objective   Blood pressure (!) 143/71, pulse (!) 101, temperature 98.2 F (36.8 C), resp. rate 18, height 6\' 1"  (1.854 m), weight (!) 209.9 kg, SpO2 98 %.    FiO2 (%):  [100 %] 100 %   Intake/Output Summary (Last 24 hours)  at 05/23/2020 1649 Last data filed at 05/23/2020 1613 Gross per 24 hour  Intake 360 ml  Output 3650 ml  Net -3290 ml   Filed Weights   05/22/20 2354 05/23/20 0843  Weight: (!) 208.7 kg (!) 209.9 kg    Examination: General: Pleasant morbidly obese man, able to lay supine without difficulty on room air HENT: Oropharynx clear, pupils equal Lungs: Clear on the right, few inspiratory crackles on the left but mostly clear there as well.  No wheezing Cardiovascular: Regular, distant, no murmur Abdomen: Obese, nondistended, positive bowel sounds Extremities: Large ankles but no obvious edema Neuro: Awake, alert, interacting appropriately, answers all questions, follows commands.  Good strength  Resolved Hospital Problem list     Assessment & Plan:   Acute on chronic hypoxemic respiratory failure and patient with congenitally absent right PA, OSA/OHS, known secondary pulmonary hypertension and unilateral left groundglass pulmonary infiltrate.  Quick resolution of his dyspnea and hypoxemia with diuresis, vasodilators argues against a left-sided community-acquired pneumonia.  Suspect that this represented pulmonary edema in the setting of decompensated right heart failure, possibly diastolic dysfunction.  In fact he has had multiple hospitalizations in the last year that were probably similar process.  Contributors to his secondary PAH include poor CPAP compliance, poor home oxygen compliance (he only wears prn), unilateral pulmonary artery.  Is possible that he could have remodeling of his left pulmonary vasculature that would benefit from targeted [facility] therapy.  Recommendations: -Repeat chest x-ray 9/14 to see if there has been  resolution of his left-sided infiltrate with diuresis, corresponding with his clinical improvement -Skeptical that this is pneumonia, if chest x-ray clears then would stop the doxycycline -Tight blood pressure control (appears to be adequate) -Needs better CPAP  compliance -Needs O2 during the daytime to ensure SPO2 greater than 90% at all times -At some point he will benefit from a right heart catheterization to assess his pulmonary pressures, PAOP.  This might best be done after we ensure that his OSA and hypoxia are adequately treated, but could proceed sooner.  I will discuss with Dr Shirlee Latch to determine right timing -If he does have PAH above and beyond treated OSA, hypoxia, systemic hypertension, then targeted PAH therapy might be helpful    Labs   CBC: Recent Labs  Lab 05/23/20 0032 05/23/20 0607  WBC 13.6* 11.4*  HGB 14.4 13.6  HCT 47.4 45.3  MCV 81.6 82.4  PLT 401* 378    Basic Metabolic Panel: Recent Labs  Lab 05/23/20 0032 05/23/20 0607  NA 140  --   K 4.2  --   CL 104  --   CO2 26  --   GLUCOSE 105*  --   BUN 9  --   CREATININE 1.19 1.15  CALCIUM 9.1  --   MG  --  1.9   GFR: Estimated Creatinine Clearance: 191.2 mL/min (by C-G formula based on SCr of 1.15 mg/dL). Recent Labs  Lab 05/23/20 0032 05/23/20 0607  PROCALCITON  --  <0.10  WBC 13.6* 11.4*    Liver Function Tests: No results for input(s): AST, ALT, ALKPHOS, BILITOT, PROT, ALBUMIN in the last 168 hours. No results for input(s): LIPASE, AMYLASE in the last 168 hours. No results for input(s): AMMONIA in the last 168 hours.  ABG    Component Value Date/Time   PHART 7.350 12/13/2019 0253   PCO2ART 45.6 12/13/2019 0253   PO2ART 79.0 (L) 12/13/2019 0253   HCO3 25.1 12/13/2019 0253   TCO2 26 12/13/2019 0253   ACIDBASEDEF 1.0 12/13/2019 0253   O2SAT 95.0 12/13/2019 0253     Coagulation Profile: No results for input(s): INR, PROTIME in the last 168 hours.  Cardiac Enzymes: No results for input(s): CKTOTAL, CKMB, CKMBINDEX, TROPONINI in the last 168 hours.  HbA1C: Hgb A1c MFr Bld  Date/Time Value Ref Range Status  12/11/2019 03:49 AM 7.1 (H) 4.8 - 5.6 % Final    Comment:    (NOTE) Pre diabetes:          5.7%-6.4% Diabetes:               >6.4% Glycemic control for   <7.0% adults with diabetes   06/08/2019 01:50 PM 6.5 (H) 4.8 - 5.6 % Final    Comment:    (NOTE) Pre diabetes:          5.7%-6.4% Diabetes:              >6.4% Glycemic control for   <7.0% adults with diabetes     CBG: Recent Labs  Lab 05/23/20 0850 05/23/20 1119 05/23/20 1617  GLUCAP 109* 98 102*    Review of Systems:   Feels better, less dyspnea.  No chest pain. He did have some dry cough on Saturday, slightly productive on Sunday.  This appears to be improved also  Past Medical History  He,  has a past medical history of ADHD (attention deficit hyperactivity disorder), Asthma, Chlamydia (11/25/2018), Emphysema of lung (HCC) (11/25/2018), Environmental allergies, and Hypothyroidism (11/25/2018).   Surgical History  History reviewed. No pertinent surgical history.   Social History   reports that he has never smoked. He has never used smokeless tobacco. He reports current drug use. Drug: Marijuana. He reports that he does not drink alcohol.   Family History   His family history includes Diabetes in his father; Hypertension in his father.   Allergies No Known Allergies   Home Medications  Prior to Admission medications   Medication Sig Start Date End Date Taking? Authorizing Provider  albuterol (PROVENTIL HFA;VENTOLIN HFA) 108 (90 Base) MCG/ACT inhaler Inhale 2 puffs into the lungs every 6 (six) hours as needed for wheezing or shortness of breath. 11/28/18  Yes Purohit, Shrey C, MD  amLODipine (NORVASC) 10 MG tablet Take 1 tablet (10 mg total) by mouth daily. 06/15/19  Yes Almon Hercules, MD  carvedilol (COREG) 6.25 MG tablet Take 1 tablet (6.25 mg total) by mouth 2 (two) times daily with a meal. 12/16/19  Yes Kirt Boys, MD  hydrochlorothiazide (MICROZIDE) 12.5 MG capsule Take 1 capsule (12.5 mg total) by mouth daily. 12/16/19  Yes Kirt Boys, MD  ibuprofen (ADVIL) 200 MG tablet Take 600-800 mg by mouth every 8 (eight) hours as needed  for headache or moderate pain.   Yes [provider]  levothyroxine (SYNTHROID) 75 MCG tablet Take 1 tablet (75 mcg total) by mouth daily before breakfast. 12/16/19  Yes Kirt Boys, MD  lisinopril (ZESTRIL) 5 MG tablet Take 1 tablet (5 mg total) by mouth daily. 12/23/19 05/23/20 Yes Thom Chimes, MD  mometasone-formoterol (DULERA) 100-5 MCG/ACT AERO Inhale 2 puffs into the lungs 2 (two) times daily. Patient taking differently: Inhale 2 puffs into the lungs 2 (two) times daily as needed for wheezing or shortness of breath.  06/09/19  Yes Rai, Ripudeep K, MD  metFORMIN (GLUCOPHAGE) 500 MG tablet Take 1 tablet (500 mg total) by mouth 2 (two) times daily with a meal. 12/16/19 12/15/20  Kirt Boys, MD     Critical care time: NA     Levy Pupa, MD, PhD 05/23/2020, 6:12 PM [facility] [phone] or if no answer [phone]

## 2020-05-23 NOTE — Progress Notes (Signed)
Pt was instructed to use urinals to measure proper output since diuretics were just given.  Pt verbalizes understanding.  Two urinals at bedside within pt's reach.  Pt is currently wearing CPAP.   05/23/20 1450  Intake (mL)  P.O. 240 mL  Percent Meals Eaten (%) 100 %  Output (mL)  Urine 450 mL

## 2020-05-23 NOTE — ED Notes (Signed)
Alerted by ED tech that pt O2 sat was high 80's on 6L. Pt has been able to relax in stretcher at this time and O2 sat now at 95% on 6L.

## 2020-05-23 NOTE — ED Provider Notes (Signed)
[facility] EMERGENCY DEPARTMENT Provider Note   CSN: [id number] Arrival date & time: 05/22/20  2319   History Chief Complaint  Patient presents with   Chest Pain   Shortness of Breath    Benjamin Rosario is a 19 y.o. male.  The history is provided by the patient.  Chest Pain Associated symptoms: shortness of breath   Shortness of Breath Associated symptoms: chest pain   He has history of hypertension, diabetes, asthma, pulmonary hypertension and comes in with worsening shortness of breath over the last 2 days.  Dyspnea is worse with laying flat and with trying to walk.  There is also a heavy feeling in the left side of his chest with radiation to the shoulder and neck.  He denies nausea, vomiting, diaphoresis.  There has been a cough which has been productive of some clear and slightly blood-streaked sputum.  He denies fever or chills.  He denies change in his sense of smell or taste.  He has not noticed any change in chronic leg swelling.  He had been infected with COVID-19 earlier this year and states that this feels similar.  He denies any sick contacts.  He has received 1 dose of the COVID-19 vaccine.  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/25/2018   Environmental allergies    Hypothyroidism 11/25/2018    Patient Active Problem List   Diagnosis Date Noted   At risk for obstructive sleep apnea 12/31/2019   Pulmonary HTN (HCC) 12/31/2019   Essential hypertension 12/23/2019   Elevated troponin 12/10/2019   Pneumonia due to COVID-19 virus 09/19/2019   Chronic respiratory failure with hypoxia (HCC)    Acute respiratory failure with hypoxia (HCC) 06/10/2019   Hypokalemia 06/10/2019   Morbid obesity with BMI of 50.0-59.9, adult (HCC) 06/10/2019   Acute respiratory failure with hypoxemia (HCC) 06/07/2019   Community acquired pneumonia  06/07/2019   Hypothyroidism 11/25/2018   Chlamydia 11/25/2018   SOB (shortness of breath) 11/25/2018   Chest pain 11/25/2018   Emphysema lung (HCC) 11/25/2018   Concussion with no loss of consciousness 07/27/2015   ADHD (attention deficit hyperactivity disorder), combined type 06/07/2013   MDD (major depressive disorder), single episode, moderate (HCC) 06/06/2013   Mixed hyperlipidemia 02/26/2011   Obesity 02/26/2011   Type 2 diabetes mellitus (HCC) 02/26/2011   Other specified acquired hypothyroidism 02/26/2011    History reviewed. No pertinent surgical history.     Family History  Problem Relation Age of Onset   Hypertension Father    Diabetes Father     Social History   Tobacco Use   Smoking status: Never Smoker   Smokeless tobacco: Never Used  Vaping Use   Vaping Use: Never used  Substance Use Topics   Alcohol use: No    Alcohol/week: 0.0 standard drinks   Drug use: Yes    Types: Marijuana    Home Medications Prior to Admission medications   Medication Sig Start Date End Date Taking? Authorizing Provider  albuterol (PROVENTIL HFA;VENTOLIN HFA) 108 (90 Base) MCG/ACT inhaler Inhale 2 puffs into the lungs every 6 (six) hours as needed for wheezing or shortness of breath. 11/28/18  Yes Purohit, Shrey C, MD  amLODipine (NORVASC) 10 MG tablet Take 1 tablet (10 mg total) by mouth daily. 06/15/19  Yes Almon Hercules, MD  carvedilol (COREG) 6.25 MG tablet Take 1 tablet (6.25 mg  total) by mouth 2 (two) times daily with a meal. 12/16/19  Yes Kirt BoysAlexander, Andrew, MD  hydrochlorothiazide (MICROZIDE) 12.5 MG capsule Take 1 capsule (12.5 mg total) by mouth daily. 12/16/19  Yes Kirt BoysAlexander, Andrew, MD  ibuprofen (ADVIL) 200 MG tablet Take 600-800 mg by mouth every 8 (eight) hours as needed for headache or moderate pain.   Yes [provider]  levothyroxine (SYNTHROID) 75 MCG tablet Take 1 tablet (75 mcg total) by mouth daily before breakfast. 12/16/19  Yes Kirt BoysAlexander,  Andrew, MD  lisinopril (ZESTRIL) 5 MG tablet Take 1 tablet (5 mg total) by mouth daily. 12/23/19 05/23/20 Yes Thom ChimesJones, Ellen, MD  mometasone-formoterol (DULERA) 100-5 MCG/ACT AERO Inhale 2 puffs into the lungs 2 (two) times daily. Patient taking differently: Inhale 2 puffs into the lungs 2 (two) times daily as needed for wheezing or shortness of breath.  06/09/19  Yes Rai, Ripudeep K, MD  metFORMIN (GLUCOPHAGE) 500 MG tablet Take 1 tablet (500 mg total) by mouth 2 (two) times daily with a meal. 12/16/19 12/15/20  Kirt BoysAlexander, Andrew, MD    Allergies    Patient has no known allergies.  Review of Systems   Review of Systems  Respiratory: Positive for shortness of breath.   Cardiovascular: Positive for chest pain.  All other systems reviewed and are negative.   Physical Exam Updated Vital Signs BP 116/79 (BP Location: Right Wrist)    Pulse (!) 110    Temp 98.5 F (36.9 C) (Oral)    Resp (!) 25    Ht 6\' 1"  (1.854 m)    Wt (!) 208.7 kg    SpO2 (!) 87%    BMI 60.69 kg/m   Physical Exam Vitals and nursing note reviewed.   19 year old male, appears mildly dyspneic at rest, but is in no acute distress. Vital signs are significant for elevated respiratory rate and heart rate. Oxygen saturation is 87%, which is hypoxic.  He is placed on nasal oxygen with improvement in oxygen saturation.  In spite of appearance of dyspnea, he is able to speak in complete sentences without stopping to take a breath. Head is normocephalic and atraumatic. PERRLA, EOMI. Oropharynx is clear. Neck is nontender and supple without adenopathy or JVD. Back is nontender and there is no CVA tenderness. Lungs are scattered expiratory wheezes without rales or rhonchi. Chest is nontender. Heart has regular rate and rhythm without murmur. Abdomen is soft, flat, nontender without masses or hepatosplenomegaly and peristalsis is normoactive. Extremities have 2-3+ edema, full range of motion is present. Skin is warm and dry without  rash. Neurologic: Mental status is normal, cranial nerves are intact, there are no motor or sensory deficits.  ED Results / Procedures / Treatments   Labs (all labs ordered are listed, but only abnormal results are displayed) Labs Reviewed  BASIC METABOLIC PANEL - Abnormal; Notable for the following components:      Result Value   Glucose, Bld 105 (*)    All other components within normal limits  CBC - Abnormal; Notable for the following components:   WBC 13.6 (*)    MCH 24.8 (*)    RDW 17.3 (*)    Platelets 401 (*)    All other components within normal limits  BRAIN NATRIURETIC PEPTIDE - Abnormal; Notable for the following components:   B Natriuretic Peptide 279.2 (*)    All other components within normal limits  D-DIMER, QUANTITATIVE (NOT AT [facility]) - Abnormal; Notable for the following components:   D-Dimer, Quant 0.63 (*)  All other components within normal limits  TROPONIN I (HIGH SENSITIVITY) - Abnormal; Notable for the following components:   Troponin I (High Sensitivity) 120 (*)    All other components within normal limits  TROPONIN I (HIGH SENSITIVITY) - Abnormal; Notable for the following components:   Troponin I (High Sensitivity) 118 (*)    All other components within normal limits  SARS CORONAVIRUS 2 BY RT PCR [facility] ORDER, PERFORMED IN [facility])    EKG EKG Interpretation  Date/Time:  Monday May 23 2020 00:28:40 EDT Ventricular Rate:  109 PR Interval:  130 QRS Duration: 94 QT Interval:  352 QTC Calculation: 474 R Axis:   108 Text Interpretation: Sinus tachycardia Rightward axis Borderline ECG When compared with ECG of 12/11/2019, No significant change was found Confirmed by Dione Booze ([id number]) on 05/23/2020 12:39:17 AM   Radiology CT Angio Chest PE W and/or Wo Contrast  Result Date: 05/23/2020 CLINICAL DATA:  Shortness of breath and positive D-dimer. EXAM: CT ANGIOGRAPHY CHEST WITH CONTRAST TECHNIQUE: Multidetector CT imaging of the  chest was performed using the standard protocol during bolus administration of intravenous contrast. Multiplanar CT image reconstructions and MIPs were obtained to evaluate the vascular anatomy. CONTRAST:  OMNIPAQUE IOHEXOL 350 MG/ML SOLN COMPARISON:  12/10/2019 FINDINGS: Cardiovascular: Normal heart size. No pericardial effusion. Suboptimal pulmonary artery opacification due to primarily to streak artifact and body habitus. No pulmonary filling defects are seen. 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 Mediastinum/Nodes: Negative for adenopathy or mass. Lungs/Pleura: Hypoplastic right lung with emphysematous spaces. Ground-glass opacity in the left upper and lower lobes with areas of air trapping causing mosaic attenuation. No effusion or pneumothorax. Upper Abdomen: Negative Musculoskeletal: No acute or aggressive finding. Review of the MIP images confirms the above findings. IMPRESSION: 1. Patchy airspace disease in the left lung which could reflect infection or unilateral edema in the setting of absent right pulmonary arteries and veins. 2. Dysplastic right lung. 3. No evidence of pulmonary embolism. Electronically Signed   By: Marnee Spring M.D.   On: 05/23/2020 04:50   DG Chest Port 1 View  Result Date: 05/23/2020 CLINICAL DATA:  Left-sided chest pain EXAM: PORTABLE CHEST 1 VIEW COMPARISON:  12/13/2019 FINDINGS: Right lung is grossly clear. Left lower lung airspace disease concerning for pneumonia. Mild cardiomegaly. No pneumothorax. IMPRESSION: Left lower lung airspace disease concerning for pneumonia. Electronically Signed   By: Jasmine Pang M.D.   On: 05/23/2020 00:51    Procedures Procedures (including critical care time)  Medications Ordered in ED Medications  albuterol (VENTOLIN HFA) 108 (90 Base) MCG/ACT inhaler 4 puff (has no administration in time range)    ED Course  I have reviewed the triage vital signs  and the nursing notes.  Pertinent labs & imaging results that were available during my care of the patient were reviewed by me and considered in my medical decision making (see chart for details).  MDM Rules/Calculators/A&P Dyspnea which could be from any 1 of a number of possibilities.  Consider pneumonia including recurrent COVID-19 infection, asthma exacerbation, heart failure, pulmonary embolism.  This differential includes conditions with high probability of complications and morbidity.  Chest x-ray is read by radiologist as concerning for left lower lobe pneumonia.  However, on reviewing the image, I think it looks more typical of pulmonary edema.  Will check BNP.  ECG shows no acute changes, but will check troponin.  With wheezing and history of asthma,  will give a trial of albuterol inhaler.  Old records are reviewed confirming hospitalization for COVID-19 earlier this year.  Will check D-dimer to rule out pulmonary embolism.  While tests have been pending, patient has become more hypoxic and has required escalation of oxygen therapy to a nonrebreather mask.  Covid test is negative.  WBC is mildly elevated.  Troponin is moderately elevated at 120.  However, this is the same level it had been elevated in the past and is felt to be unlikely to represent ACS.  Repeat troponin is pending.  BNP is moderately elevated at 279, consistent with CHF exacerbation.  He is given a dose of intravenous furosemide and started on topical nitrates.  D-dimer is also mildly elevated at 0.63.  Will send for CT angiogram to rule out pulmonary embolism.  CT angiogram shows no evidence of pulmonary embolism.  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  Densities in left lung seem consistent with heart failure, especially in the setting of hypoplastic right lung.  He had good diuresis and is feeling better.  Oxygen is being weaned down to nasal cannula.  Repeat troponin is unchanged.  Case is discussed with Dr.  Toniann Fail of [facility], who agrees to admit the patient.  Benjamin Rosario was evaluated in Emergency Department on 05/23/2020 for the symptoms described in the history of present illness. He was evaluated in the context of the global COVID-19 pandemic, which necessitated consideration that the patient might be at risk for infection with the SARS-CoV-2 virus that causes COVID-19. Institutional protocols and algorithms that pertain to the evaluation of patients at risk for COVID-19 are in a state of rapid change based on information released by regulatory bodies including the CDC and federal and state organizations. These policies and algorithms were followed during the patient's care in the ED.  Final Clinical Impression(s) / ED Diagnoses Final diagnoses:  Acute heart failure, unspecified heart failure type (HCC)  Elevated blood pressure reading with diagnosis of hypertension  Hypoplasia of right lung  Elevated troponin    Rx / DC Orders ED Discharge Orders    None       Dione Booze, MD 05/23/20 [phone]

## 2020-05-23 NOTE — ED Notes (Signed)
Pt O2 sats intermittently drop to low 80's on monitor. Pt was found to be asleep and snoring. Pt reports wearing a CPAP at home. Pt is awake and O2 sats are now dropping to mid 70's. Pt placed on NRB at this time.

## 2020-05-23 NOTE — ED Notes (Signed)
Patient transported to CT 

## 2020-05-23 NOTE — ED Notes (Signed)
NRB removed and pt placed on [place] @ 6L at this time at providers request. Pt tolerating well.

## 2020-05-23 NOTE — H&P (Signed)
History and Physical    Benjamin Rosario XBD:[id number] DOB: 12/25/2000 DOA: 05/22/2020  PCP: Soundra Pilon, FNP   Patient coming from: Home.  Chief Complaint: Shortness of breath and chest pain.  HPI: Benjamin Rosario is a 19 y.o. male with history of morbid obesity, RV dysfunction/pulmonary hypertension, history of congenital pulmonary stenosis, diabetes mellitus, hypothyroidism, hypertension emphysema presents to the ER because of shortness of breath.  Patient states for the last 4 days patient has become increasingly short of breath initially on exertion later on became short of breath even lying down.  Over the last 4 days patient also has increasing chest pressure mostly in the left side of the chest radiating to his left neck.  Has also developed some nonproductive cough.  No fever or chills nausea vomiting or diarrhea.  ED Course: In the ER patient's Covid test was negative chest x-ray shows possible pneumonia CT angiogram of the chest shows edema versus pneumonia.  Labs are remarkable for WBC of 13.6 BNP of 279 high sensitive troponin of 120 and 118.  Patient was given 40 mg IV Lasix and nitroglycerin admitted for further management.  Blood pressure at admission was 169/113.  Review of Systems: As per HPI, rest all negative.   Past Medical History:  Diagnosis Date  . ADHD (attention deficit hyperactivity disorder)   . Asthma   . Chlamydia 11/25/2018  . Emphysema of lung (HCC) 11/25/2018  . Environmental allergies   . Hypothyroidism 11/25/2018    History reviewed. No pertinent surgical history.   reports that he has never smoked. He has never used smokeless tobacco. He reports current drug use. Drug: Marijuana. He reports that he does not drink alcohol.  No Known Allergies  Family History  Problem Relation Age of Onset  . Hypertension Father   . Diabetes Father     Prior to Admission medications   Medication Sig Start Date End Date Taking? Authorizing Provider  albuterol  (PROVENTIL HFA;VENTOLIN HFA) 108 (90 Base) MCG/ACT inhaler Inhale 2 puffs into the lungs every 6 (six) hours as needed for wheezing or shortness of breath. 11/28/18  Yes Purohit, Shrey C, MD  amLODipine (NORVASC) 10 MG tablet Take 1 tablet (10 mg total) by mouth daily. 06/15/19  Yes Almon Hercules, MD  carvedilol (COREG) 6.25 MG tablet Take 1 tablet (6.25 mg total) by mouth 2 (two) times daily with a meal. 12/16/19  Yes Kirt Boys, MD  hydrochlorothiazide (MICROZIDE) 12.5 MG capsule Take 1 capsule (12.5 mg total) by mouth daily. 12/16/19  Yes Kirt Boys, MD  ibuprofen (ADVIL) 200 MG tablet Take 600-800 mg by mouth every 8 (eight) hours as needed for headache or moderate pain.   Yes [provider]  levothyroxine (SYNTHROID) 75 MCG tablet Take 1 tablet (75 mcg total) by mouth daily before breakfast. 12/16/19  Yes Kirt Boys, MD  lisinopril (ZESTRIL) 5 MG tablet Take 1 tablet (5 mg total) by mouth daily. 12/23/19 05/23/20 Yes Thom Chimes, MD  mometasone-formoterol (DULERA) 100-5 MCG/ACT AERO Inhale 2 puffs into the lungs 2 (two) times daily. Patient taking differently: Inhale 2 puffs into the lungs 2 (two) times daily as needed for wheezing or shortness of breath.  06/09/19  Yes Rai, Ripudeep K, MD  metFORMIN (GLUCOPHAGE) 500 MG tablet Take 1 tablet (500 mg total) by mouth 2 (two) times daily with a meal. 12/16/19 12/15/20  Kirt Boys, MD    Physical Exam: Constitutional: Moderately built and nourished. Vitals:   05/23/20 0200 05/23/20 0215 05/23/20 0230  05/23/20 0455  BP: 138/89 (!) 169/113 134/77 124/80  Pulse: (!) 101 (!) 106 (!) 106 95  Resp: (!) 34 (!) 35 (!) 27 (!) 24  Temp:      TempSrc:      SpO2: 98% 100% 96% 100%  Weight:      Height:       Eyes: Anicteric no pallor. ENMT: No discharge from the ears eyes nose or mouth. Neck: No mass felt.  No neck rigidity.  No JVD appreciated. Respiratory: No rhonchi or crepitations. Cardiovascular: S1-S2 heard. Abdomen:  Soft nontender bowel sounds present. Musculoskeletal: Mild edema of the both lower extremities. Skin: No rash. Neurologic: Alert awake oriented to time place and person.  Moves all extremities. Psychiatric: Appears normal.  Normal affect.   Labs on Admission: I have personally reviewed following labs and imaging studies  CBC: Recent Labs  Lab 05/23/20 0032  WBC 13.6*  HGB 14.4  HCT 47.4  MCV 81.6  PLT 401*   Basic Metabolic Panel: Recent Labs  Lab 05/23/20 0032  NA 140  K 4.2  CL 104  CO2 26  GLUCOSE 105*  BUN 9  CREATININE 1.19  CALCIUM 9.1   GFR: Estimated Creatinine Clearance: 184 mL/min (by C-G formula based on SCr of 1.19 mg/dL). Liver Function Tests: No results for input(s): AST, ALT, ALKPHOS, BILITOT, PROT, ALBUMIN in the last 168 hours. No results for input(s): LIPASE, AMYLASE in the last 168 hours. No results for input(s): AMMONIA in the last 168 hours. Coagulation Profile: No results for input(s): INR, PROTIME in the last 168 hours. Cardiac Enzymes: No results for input(s): CKTOTAL, CKMB, CKMBINDEX, TROPONINI in the last 168 hours. BNP (last 3 results) No results for input(s): PROBNP in the last 8760 hours. HbA1C: No results for input(s): HGBA1C in the last 72 hours. CBG: No results for input(s): GLUCAP in the last 168 hours. Lipid Profile: No results for input(s): CHOL, HDL, LDLCALC, TRIG, CHOLHDL, LDLDIRECT in the last 72 hours. Thyroid Function Tests: No results for input(s): TSH, T4TOTAL, FREET4, T3FREE, THYROIDAB in the last 72 hours. Anemia Panel: No results for input(s): VITAMINB12, FOLATE, FERRITIN, TIBC, IRON, RETICCTPCT in the last 72 hours. Urine analysis:    Component Value Date/Time   COLORURINE YELLOW 12/10/2019 2345   APPEARANCEUR CLEAR 12/10/2019 2345   LABSPEC 1.033 (H) 12/10/2019 2345   PHURINE 6.0 12/10/2019 2345   GLUCOSEU NEGATIVE 12/10/2019 2345   HGBUR NEGATIVE 12/10/2019 2345   BILIRUBINUR NEGATIVE 12/10/2019 2345    KETONESUR NEGATIVE 12/10/2019 2345   PROTEINUR >=300 (A) 12/10/2019 2345   UROBILINOGEN 0.2 06/09/2013 0731   NITRITE NEGATIVE 12/10/2019 2345   LEUKOCYTESUR NEGATIVE 12/10/2019 2345   Sepsis Labs: @LABRCNTIP (procalcitonin:4,lacticidven:4) ) Recent Results (from the past 240 hour(s))  SARS Coronavirus 2 by RT PCR (hospital order, performed in [facility] hospital lab) Nasopharyngeal Nasopharyngeal Swab     Status: None   Collection Time: 05/22/20 11:45 PM   Specimen: Nasopharyngeal Swab  Result Value Ref Range Status   SARS Coronavirus 2 NEGATIVE NEGATIVE Final    Comment: (NOTE) SARS-CoV-2 target nucleic acids are NOT DETECTED.  The SARS-CoV-2 RNA is generally detectable in upper and lower respiratory specimens during the acute phase of infection. The lowest concentration of SARS-CoV-2 viral copies this assay can detect is 250 copies / mL. A negative result does not preclude SARS-CoV-2 infection and should not be used as the sole basis for treatment or other patient management decisions.  A negative result may occur with improper specimen  collection / handling, submission of specimen other than nasopharyngeal swab, presence of viral mutation(s) within the areas targeted by this assay, and inadequate number of viral copies (<250 copies / mL). A negative result must be combined with clinical observations, patient history, and epidemiological information.  Fact Sheet for Patients:   BoilerBrush.com.cy  Fact Sheet for Healthcare Providers: https://pope.com/  This test is not yet approved or  cleared by the [place] FDA and has been authorized for detection and/or diagnosis of SARS-CoV-2 by FDA under an Emergency Use Authorization (EUA).  This EUA will remain in effect (meaning this test can be used) for the duration of the COVID-19 declaration under Section 564(b)(1) of the Act, 21 U.S.C. section 360bbb-3(b)(1), unless the  authorization is terminated or revoked sooner.  Performed at [facility], [address], [place], [place] [address]      Radiological Exams on Admission: CT Angio Chest PE W and/or Wo Contrast  Result Date: 05/23/2020 CLINICAL DATA:  Shortness of breath and positive D-dimer. EXAM: CT ANGIOGRAPHY CHEST WITH CONTRAST TECHNIQUE: Multidetector CT imaging of the chest was performed using the standard protocol during bolus administration of intravenous contrast. Multiplanar CT image reconstructions and MIPs were obtained to evaluate the vascular anatomy. CONTRAST:  OMNIPAQUE IOHEXOL 350 MG/ML SOLN COMPARISON:  12/10/2019 FINDINGS: Cardiovascular: Normal heart size. No pericardial effusion. Suboptimal pulmonary artery opacification due to primarily to streak artifact and body habitus. No pulmonary filling defects are seen. 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 Mediastinum/Nodes: Negative for adenopathy or mass. Lungs/Pleura: Hypoplastic right lung with emphysematous spaces. Ground-glass opacity in the left upper and lower lobes with areas of air trapping causing mosaic attenuation. No effusion or pneumothorax. Upper Abdomen: Negative Musculoskeletal: No acute or aggressive finding. Review of the MIP images confirms the above findings. IMPRESSION: 1. Patchy airspace disease in the left lung which could reflect infection or unilateral edema in the setting of absent right pulmonary arteries and veins. 2. Dysplastic right lung. 3. No evidence of pulmonary embolism. Electronically Signed   By: Marnee Spring M.D.   On: 05/23/2020 04:50   DG Chest Port 1 View  Result Date: 05/23/2020 CLINICAL DATA:  Left-sided chest pain EXAM: PORTABLE CHEST 1 VIEW COMPARISON:  12/13/2019 FINDINGS: Right lung is grossly clear. Left lower lung airspace disease concerning for pneumonia. Mild cardiomegaly. No pneumothorax. IMPRESSION: Left lower  lung airspace disease concerning for pneumonia. Electronically Signed   By: Jasmine Pang M.D.   On: 05/23/2020 00:51    EKG: Sinus tachycardia.  Assessment/Plan Principal Problem:   Acute respiratory failure (HCC) Active Problems:   Type 2 diabetes mellitus (HCC)   ADHD (attention deficit hyperactivity disorder), combined type   Hypothyroidism   Essential hypertension   Pulmonary HTN (HCC)   Acute heart failure (HCC)    1. Acute respiratory failure hypoxia presently on 6 L of oxygen most likely could be from possible fluid overload versus pneumonia.  Patient received 40 mg IV Lasix and I have placed patient on 40 mg every 12 along with nitroglycerin patch also we will keep patient on antibiotics doxycycline which can be discontinued if procalcitonin is negative.  Follow intake output Daily weights and metabolic panel patient is on ACE inhibitor.  I have consulted cardiology since patient's last 2D echo showed RV dysfunction in April 2021. 2. Hypertension uncontrolled continue home medication amlodipine Coreg ACE inhibitor and as needed IV hydralazine presently on nitroglycerin patch and  Lasix. 3. Diabetes mellitus type 2 we will keep patient on sliding scale coverage. 4. Hypothyroidism on Synthroid. 5. Morbid obesity will need counseling.  Given that patient has acute respiratory failure with hypoxia will need close monitoring for any further worsening in inpatient status.   DVT prophylaxis: Lovenox. Code Status: Full code. Family Communication: Discussed with patient. Disposition Plan: Home. Consults called: None. Admission status: Inpatient.   Eduard ClosArshad N Shawneen Deetz MD [facility] Pager [phone].  If 7PM-7AM, please contact night-coverage [email or web address] Password [email or web address]  05/23/2020, 6:10 AM

## 2020-05-23 NOTE — Progress Notes (Addendum)
  PROGRESS NOTE  Patient admitted earlier this morning. See H&P.   Benjamin Rosario is a 19 y.o. male with history of morbid obesity, RV dysfunction/pulmonary hypertension, history of congenital pulmonary stenosis, diabetes mellitus, hypothyroidism, hypertension, emphysema, OSA on CPAP who presents to the ER because of shortness of breath.  Patient states for the last 4 days patient has become increasingly short of breath initially on exertion later on became short of breath even lying down.  Over the last 4 days, patient also has increasing chest pressure mostly in the left side of the chest radiating to his left neck.  Has also developed some nonproductive cough. Chest x-ray shows possible pneumonia. CT angiogram of the chest shows edema versus pneumonia.   Patient seen and examined in the emergency department.  He remains on 6 L nasal cannula O2 in the emergency department, states that his breathing has mildly improved since admission.  Not at baseline yet.  A/P:  Acute hypoxemic respiratory failure -Continue nasal cannula O2 and wean as able  Acute on chronic diastolic heart failure, right heart failure -BNP 279.2 -Continue IV Lasix -Strict I's and O's, fluid restriction, daily weight -Cardiology consulted  Demand ischemia -Troponin peak at 120  CAP -COVID-19 negative -Continue doxycycline x5 days  Essential hypertension, uncontrolled -Continue amlodipine, Coreg, lisinopril  Diabetes mellitus type 2 -Hemoglobin A1c pending  -Sliding scale insulin  Hypothyroidism -Continue Synthroid -TSH elevated 6.291, check free T4  OSA -CPAP nightly  Morbid obesity -Estimated body mass index is 61.06 kg/m as calculated from the following:   Height as of this encounter: 6\' 1"  (1.854 m).   Weight as of this encounter: 209.9 kg.   Status is: Inpatient  Remains inpatient appropriate because:Hemodynamically unstable, IV treatments appropriate due to intensity of illness or inability to  take PO and Inpatient level of care appropriate due to severity of illness   Dispo: The patient is from: Home              Anticipated d/c is to: Home              Anticipated d/c date is: 2 days              Patient currently is not medically stable to d/c.  Patient remains on 6 L nasal cannula O2, IV Lasix.  Cardiology consult pending   , DO [facility] 05/23/2020, 12:37 PM  Available via Epic secure chat 7am-7pm After these hours, please refer to coverage provider listed on [email or web address]

## 2020-05-24 ENCOUNTER — Inpatient Hospital Stay (HOSPITAL_COMMUNITY): Payer: No Typology Code available for payment source

## 2020-05-24 LAB — CBC
HCT: 45.9 % (ref 39.0–52.0)
Hemoglobin: 13.8 g/dL (ref 13.0–17.0)
MCH: 24.2 pg — ABNORMAL LOW (ref 26.0–34.0)
MCHC: 30.1 g/dL (ref 30.0–36.0)
MCV: 80.4 fL (ref 80.0–100.0)
Platelets: 378 10*3/uL (ref 150–400)
RBC: 5.71 MIL/uL (ref 4.22–5.81)
RDW: 16.5 % — ABNORMAL HIGH (ref 11.5–15.5)
WBC: 10.8 10*3/uL — ABNORMAL HIGH (ref 4.0–10.5)
nRBC: 0 % (ref 0.0–0.2)

## 2020-05-24 LAB — BASIC METABOLIC PANEL
Anion gap: 11 (ref 5–15)
BUN: 12 mg/dL (ref 6–20)
CO2: 28 mmol/L (ref 22–32)
Calcium: 8.9 mg/dL (ref 8.9–10.3)
Chloride: 98 mmol/L (ref 98–111)
Creatinine, Ser: 1.04 mg/dL (ref 0.61–1.24)
GFR calc Af Amer: >60 mL/min (ref >60)
GFR calc non Af Amer: >60 mL/min (ref >60)
Glucose, Bld: 78 mg/dL (ref 70–99)
Potassium: 3.7 mmol/L (ref 3.5–5.1)
Sodium: 137 mmol/L (ref 135–145)

## 2020-05-24 LAB — GLUCOSE, CAPILLARY
Glucose-Capillary: 95 mg/dL (ref 70–99)
Glucose-Capillary: 109 mg/dL — ABNORMAL HIGH (ref 70–99)
Glucose-Capillary: 97 mg/dL (ref 70–99)
Glucose-Capillary: 85 mg/dL (ref 70–99)

## 2020-05-24 LAB — MAGNESIUM: Magnesium: 2 mg/dL (ref 1.7–2.4)

## 2020-05-24 LAB — T4, FREE: Free T4: 0.94 ng/dL (ref 0.61–1.12)

## 2020-05-24 LAB — HEMOGLOBIN A1C
Hgb A1c MFr Bld: 6.5 % — ABNORMAL HIGH (ref 4.8–5.6)
Mean Plasma Glucose: 139.85 mg/dL

## 2020-05-24 MED ORDER — SODIUM CHLORIDE 0.9 % IV SOLN: 250.0000 mL | INTRAVENOUS | Status: DC | PRN

## 2020-05-24 MED ORDER — ACETAMINOPHEN 325 MG PO TABS
650.0000 mg | ORAL_TABLET | ORAL | Status: DC | PRN
  Administered 2020-05-24: 650 mg via ORAL
  Filled 2020-05-24: qty 2

## 2020-05-24 MED ORDER — SODIUM CHLORIDE 0.9 % IV SOLN: INTRAVENOUS | Status: DC

## 2020-05-24 MED ORDER — POTASSIUM CHLORIDE CRYS ER 20 MEQ PO TBCR
40.0000 meq | EXTENDED_RELEASE_TABLET | Freq: Once | ORAL | Status: AC
  Administered 2020-05-24: 40 meq via ORAL
  Filled 2020-05-24: qty 2

## 2020-05-24 MED ORDER — ENOXAPARIN SODIUM 100 MG/ML ~~LOC~~ SOLN
100.0000 mg | SUBCUTANEOUS | Status: DC
  Administered 2020-05-25 – 2020-05-26 (×2): 100 mg via SUBCUTANEOUS
  Filled 2020-05-24 (×2): qty 1

## 2020-05-24 MED ORDER — ASPIRIN 81 MG PO CHEW: 81.0000 mg | CHEWABLE_TABLET | ORAL | Status: DC

## 2020-05-24 MED ORDER — SODIUM CHLORIDE 0.9% FLUSH: 3.0000 mL | INTRAVENOUS | Status: DC | PRN

## 2020-05-24 MED ORDER — ASPIRIN 81 MG PO CHEW
81.0000 mg | CHEWABLE_TABLET | ORAL | Status: AC
  Administered 2020-05-25: 81 mg via ORAL
  Filled 2020-05-24: qty 1

## 2020-05-24 MED ORDER — SODIUM CHLORIDE 0.9% FLUSH
3.0000 mL | Freq: Two times a day (BID) | INTRAVENOUS | Status: DC
  Administered 2020-05-24 – 2020-05-26 (×3): 3 mL via INTRAVENOUS

## 2020-05-24 NOTE — Plan of Care (Signed)

## 2020-05-24 NOTE — H&P (View-Only) (Signed)
Advanced Heart Failure Rounding Note  PCP-Cardiologist: No primary care provider on file.   Subjective:    Sleeping but not using CPAP. Wearing [place].  Reports he slept well. No complaints.   On Doxycyline for PNA. WBCs trending down, 13.6>>11.4. AF.   Good diuresis w/ IV Lasix, -4.2L in UOP yesterday. Wt down 6 lb. BMP pending. BP stable.   Objective:   Weight Range: (!) 207.2 kg Body mass index is 60.25 kg/m.   Vital Signs:   Temp:  [98 F (36.7 C)-98.7 F (37.1 C)] 98.1 F (36.7 C) (09/14 0442) Pulse Rate:  [81-104] 89 (09/14 0442) Resp:  [18-26] 20 (09/14 0442) BP: (126-143)/(58-86) 128/61 (09/14 0442) SpO2:  [94 %-100 %] 96 % (09/14 0442) Weight:  [207.2 kg-209.9 kg] 207.2 kg (09/14 0442) Last BM Date: 05/23/20  Weight change: Filed Weights   05/22/20 2354 05/23/20 0843 05/24/20 0442  Weight: (!) 208.7 kg (!) 209.9 kg (!) 207.2 kg    Intake/Output:   Intake/Output Summary (Last 24 hours) at 05/24/2020 0734 Last data filed at 05/24/2020 5883 Gross per 24 hour  Intake 829.74 ml  Output 4200 ml  Net -3370.26 ml      Physical Exam    General:  Well appearing, super morbidly obese. No resp difficulty HEENT: Normal Neck: Supple. Thick neck, JVD assessment difficult . Carotids 2+ bilat; no bruits. No lymphadenopathy or thyromegaly appreciated. Cor: PMI nondisplaced. Regular rate & rhythm. No rubs, gallops or murmurs. Lungs: Clear, no wheezing  Abdomen: obese, soft, nontender, nondistended. No hepatosplenomegaly. No bruits or masses. Good bowel sounds. Extremities: No cyanosis, clubbing, rash, obese extremities, no pitting edema Neuro: Alert & orientedx3, cranial nerves grossly intact. moves all 4 extremities w/o difficulty. Affect pleasant   Telemetry   NSR 80s-90s   EKG    NSR 85 bpm, RAD   Labs    CBC Recent Labs    05/23/20 0032 05/23/20 0607  WBC 13.6* 11.4*  HGB 14.4 13.6  HCT 47.4 45.3  MCV 81.6 82.4  PLT 401* 378   Basic Metabolic  Panel Recent Labs    05/23/20 0032 05/23/20 0607  NA 140  --   K 4.2  --   CL 104  --   CO2 26  --   GLUCOSE 105*  --   BUN 9  --   CREATININE 1.19 1.15  CALCIUM 9.1  --   MG  --  1.9   Liver Function Tests No results for input(s): AST, ALT, ALKPHOS, BILITOT, PROT, ALBUMIN in the last 72 hours. No results for input(s): LIPASE, AMYLASE in the last 72 hours. Cardiac Enzymes No results for input(s): CKTOTAL, CKMB, CKMBINDEX, TROPONINI in the last 72 hours.  BNP: BNP (last 3 results) Recent Labs    06/08/19 1350 12/11/19 0349 05/23/20 0032  BNP 87.4 108.2* 279.2*    ProBNP (last 3 results) No results for input(s): PROBNP in the last 8760 hours.   D-Dimer Recent Labs    05/23/20 0125  DDIMER 0.63*   Hemoglobin A1C No results for input(s): HGBA1C in the last 72 hours. Fasting Lipid Panel No results for input(s): CHOL, HDL, LDLCALC, TRIG, CHOLHDL, LDLDIRECT in the last 72 hours. Thyroid Function Tests Recent Labs    05/23/20 0607  TSH 6.291*    Other results:   Imaging     No results found.   Medications:     Scheduled Medications: . amLODipine  10 mg Oral Daily  . aspirin  81 mg Oral  Pre-Cath  . aspirin EC  81 mg Oral Daily  . carvedilol  6.25 mg Oral BID WC  . doxycycline  100 mg Oral Q12H  . enoxaparin (LOVENOX) injection  40 mg Subcutaneous Q24H  . furosemide  60 mg Intravenous Q12H  . insulin aspart  0-9 Units Subcutaneous TID WC  . levothyroxine  75 mcg Oral QAC breakfast  . mometasone-formoterol  2 puff Inhalation BID  . nitroGLYCERIN  0.4 mg Transdermal Daily  . sodium chloride flush  3 mL Intravenous Q12H  . spironolactone  12.5 mg Oral Daily     Infusions: . sodium chloride 1,000 mL (05/23/20 0926)  . sodium chloride    . sodium chloride       PRN Medications:  sodium chloride, sodium chloride, acetaminophen, albuterol, hydrALAZINE, ondansetron **OR** ondansetron (ZOFRAN) IV, sodium chloride flush    Assessment/Plan    1. 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  The pulmonary veins appear to lead to the left atrium on CTA this admission though the right pulmonary veins are small.  No hemoptysis.  He presents with possible PNA but actually appears to be in the left lung by CTA.  Can develop endothelial dysfunction with pulmonary hypertension in setting of UAPA, patient has dilated/dysfunctional RV on 4/21 echo.  I do not see evidence for pulmonary valve stenosis on 4/21 echo.  2. Pulmonary hypertension/RV failure: Echo in 4/21 with normal LV systolic function, moderately dilated/dysfunctional RV with d-shaped septum.  As above, Can develop endothelial dysfunction with pulmonary hypertension in setting of UAPA (group 1 PH).  However, patient also has OHS/OSA which can cause pulmonary hypertension/RV failure (group 3 PH).  Exam is difficult for volume, suspect volume overload.  - Continue IV Lasix 60 mg bid. Check BMP today  - Continue home oxygen + CPAP (encouraged to use while sleep).  - Will arrange for RHC when he is more diuresed and oxygen requirement is back to baseline, scheduled for Wednesday. Some patients with UAPA appear to have benefits from pulmonary vasodilators in setting of PAH.  3. HTN: Continue amlodipine, Coreg.  Will hold lisinopril for now.  Continue spironolactone 12.5 mg daily. Check BMP  4. ID: Possible left lung PNA.  No fever, WBCs mildly elevated and downtrending.  - Covering with doxycycline.   5. OHS/OSA: On 3L home oxygen and CPAP at home since 3/21.   Length of Stay: 1  Robbie Lis, PA-C  05/24/2020, 7:34 AM  Advanced Heart Failure Team Pager [phone] (M-F; 7a - 4p)  Please contact [facility] for night-coverage after hours (4p -7a ) and weekends on [email or web address]  Patient seen with PA, agree with the above note.   He diuresed well yesterday, I/Os net negative 3970.  Creatinine stable.   General: NAD,  obese Neck: Thick, JVP ?10-12 cm, no thyromegaly or thyroid nodule.  Lungs: Distant BS CV: Nondisplaced PMI.  Heart regular S1/S2, no S3/S4, no murmur.  1+ ankle edema.  Abdomen: Soft, nontender, no hepatosplenomegaly, no distention.  Skin: Intact without lesions or rashes.  Neurologic: Alert and oriented x 3.  Psych: Normal affect. Extremities: No clubbing or cyanosis.  HEENT: Normal.   Volume status improving, still think he has some residual RV failure.   - Continue Lasix 60 mg IV bid today, replace K.  - Continue CPAP at night and oxygen at all other times.  - Repeat CXR, if clearing with diuresis can probably stop doxycycline.  - Plan RHC tentatively tomorrow to  assess PA pressure, ?component of group 1 PH due to endothelial dysfunction over time with high flow through single PA. Discussed risks/benefits with patient, he agrees to procedure.   BP control is adequate.   Marca Ancona 05/24/2020 10:40 AM

## 2020-05-24 NOTE — Progress Notes (Signed)
   05/23/20 2020  Assess: MEWS Score  Temp 98.2 F (36.8 C)  BP (!) 126/58  Pulse Rate 86  Resp (!) 26  SpO2 100 %  O2 Device CPAP  Assess: MEWS Score  MEWS Temp 0  MEWS Systolic 0  MEWS Pulse 0  MEWS RR 2  MEWS LOC 0  MEWS Score 2  MEWS Score Color Yellow  Assess: if the MEWS score is Yellow or Red  Were vital signs taken at a resting state? Yes  Focused Assessment No change from prior assessment  Early Detection of Sepsis Score *See Row Information* High  MEWS guidelines implemented *See Row Information* Yes  Treat  MEWS Interventions Other (Comment) (cpap placed by rt)  Pain Scale 0-10  Pain Score 0  Take Vital Signs  Increase Vital Sign Frequency  Yellow: Q 2hr X 2 then Q 4hr X 2, if remains yellow, continue Q 4hrs  Escalate  MEWS: Escalate Yellow: discuss with charge nurse/RN and consider discussing with provider and RRT  Notify: Charge Nurse/RN  Name of Charge Nurse/RN Notified [email or web address], rn  Date Charge Nurse/RN Notified 05/23/20  Time Charge Nurse/RN Notified 2025  Document  Patient Outcome Other (Comment) (RR improved)  Progress note created (see row info) Yes   Patient MEWS yellow due to RR.  RT recently placed patient on CPAP.  Yellow MEWS protocol initiated.

## 2020-05-24 NOTE — Progress Notes (Signed)
PROGRESS NOTE    Benjamin Rosario  WJX:[id number] DOB: 12/21/2000 DOA: 05/22/2020 PCP: Soundra PilonBrake, Andrew R, FNP     Brief Narrative:  Benjamin Balsamaaron Walkeris a 18 y.o.malewithhistory of morbid obesity, RV dysfunction/pulmonary hypertension, diabetes mellitus, hypothyroidism, hypertension, emphysema, OSA on CPAP who presents to the ER because of shortness of breath. Patient states for the last 4 days patient has become increasingly short of breath initially on exertion later on became short of breath even lying down. Over the last 4 days, patient also has increasing chest pressure mostly in the left side of the chest radiating to his left neck. Has also developed some nonproductive cough. Chest x-ray shows possible pneumonia. CT angiogram of the chest shows edema versus pneumonia. Patient was admitted due to acute respiratory failure, right heart failure. Cardiology consulted.   New events last 24 hours / Subjective: Wore his CPAP per his report.  No complaints on examination.  Assessment & Plan:   Principal Problem:   Acute respiratory failure (HCC) Active Problems:   Type 2 diabetes mellitus (HCC)   ADHD (attention deficit hyperactivity disorder), combined type   Hypothyroidism   Essential hypertension   Pulmonary hypertension, unspecified (HCC)   Acute heart failure (HCC)   RVF (right ventricular failure) (HCC)   Acute hypoxemic respiratory failure -Continue nasal cannula O2 and wean as able -Remains on 6 L nasal cannula presently  Acute on chronic diastolic heart failure, right heart failure -BNP 279.2 -Continue IV Lasix -Strict I's and O's, fluid restriction, daily weight -Cardiology following -Planning for heart cath 9/15  Demand ischemia -Troponin peak at 120  CAP ruled out  -COVID-19 negative -Chest x-ray with improvement in aeration on the left. Procalcitonin negative. Stop antibiotics   Essential hypertension, uncontrolled -Continue amlodipine, Coreg,  spironolactone  Diabetes mellitus type 2 -Hemoglobin A1c  6.5  -Sliding scale insulin  Hypothyroidism -TSH elevated 6.291, free T4 normal 0.94 -Continue Synthroid  OSA -CPAP nightly  Morbid obesity Estimated body mass index is 60.25 kg/m as calculated from the following:   Height as of this encounter: 6\' 1"  (1.854 m).   Weight as of this encounter: 207.2 kg.     DVT prophylaxis:  Place and maintain sequential compression device Start: 05/24/20 1154  Code Status: Full Family Communication: None at bedside Disposition Plan:  Status is: Inpatient  Remains inpatient appropriate because:Inpatient level of care appropriate due to severity of illness   Dispo: The patient is from: Home              Anticipated d/c is to: Home              Anticipated d/c date is: 2 days              Patient currently is not medically stable to d/c.  Planning for heart cath 9/15    Consultants:   Cardiology and heart failure team  PCCM  Procedures:   None   Antimicrobials:  Anti-infectives (From admission, onward)   Start     Dose/Rate Route Frequency Ordered Stop   05/23/20 2200  doxycycline (VIBRA-TABS) tablet 100 mg  Status:  Discontinued        100 mg Oral Every 12 hours 05/23/20 1227 05/24/20 1153   05/23/20 0700  doxycycline (VIBRAMYCIN) 100 mg in sodium chloride 0.9 % 250 mL IVPB  Status:  Discontinued        100 mg 125 mL/hr over 120 Minutes Intravenous Every 12 hours 05/23/20 0611 05/23/20 1227  Objective: Vitals:   05/24/20 0039 05/24/20 0442 05/24/20 0858 05/24/20 0900  BP: (!) 139/59 128/61 (!) 134/53   Pulse: 88 89 79   Resp: 19 20 16    Temp:  98.1 F (36.7 C) 98.9 F (37.2 C)   TempSrc:  Oral Oral   SpO2: 98% 96% 96% 96%  Weight:  (!) 207.2 kg    Height:        Intake/Output Summary (Last 24 hours) at 05/24/2020 1153 Last data filed at 05/24/2020 1012 Gross per 24 hour  Intake 1252.74 ml  Output 4800 ml  Net -3547.26 ml   Filed Weights    05/22/20 2354 05/23/20 0843 05/24/20 0442  Weight: (!) 208.7 kg (!) 209.9 kg (!) 207.2 kg    Examination:  General exam: Appears calm and comfortable  Respiratory system: Clear to auscultation. Respiratory effort normal. No respiratory distress. No conversational dyspnea.  On 6 L nasal cannula O2 Cardiovascular system: S1 & S2 heard, RRR. No murmurs. No pedal edema. Gastrointestinal system: Abdomen is nondistended, soft and nontender. Normal bowel sounds heard. Central nervous system: Alert and oriented. No focal neurological deficits. Speech clear.  Extremities: Symmetric in appearance  Skin: No rashes, lesions or ulcers on exposed skin  Psychiatry: Judgement and insight appear normal. Mood & affect appropriate.   Data Reviewed: I have personally reviewed following labs and imaging studies  CBC: Recent Labs  Lab 05/23/20 0032 05/23/20 0607 05/24/20 0639  WBC 13.6* 11.4* 10.8*  HGB 14.4 13.6 13.8  HCT 47.4 45.3 45.9  MCV 81.6 82.4 80.4  PLT 401* 378 378   Basic Metabolic Panel: Recent Labs  Lab 05/23/20 0032 05/23/20 0607 05/24/20 0639  NA 140  --  137  K 4.2  --  3.7  CL 104  --  98  CO2 26  --  28  GLUCOSE 105*  --  78  BUN 9  --  12  CREATININE 1.19 1.15 1.04  CALCIUM 9.1  --  8.9  MG  --  1.9 2.0   GFR: Estimated Creatinine Clearance: 209.6 mL/min (by C-G formula based on SCr of 1.04 mg/dL). Liver Function Tests: No results for input(s): AST, ALT, ALKPHOS, BILITOT, PROT, ALBUMIN in the last 168 hours. No results for input(s): LIPASE, AMYLASE in the last 168 hours. No results for input(s): AMMONIA in the last 168 hours. Coagulation Profile: No results for input(s): INR, PROTIME in the last 168 hours. Cardiac Enzymes: No results for input(s): CKTOTAL, CKMB, CKMBINDEX, TROPONINI in the last 168 hours. BNP (last 3 results) No results for input(s): PROBNP in the last 8760 hours. HbA1C: Recent Labs    05/24/20 0639  HGBA1C 6.5*   CBG: Recent Labs  Lab  05/23/20 0850 05/23/20 1119 05/23/20 1617 05/23/20 2137 05/24/20 0647  GLUCAP 109* 98 102* 124* 95   Lipid Profile: No results for input(s): CHOL, HDL, LDLCALC, TRIG, CHOLHDL, LDLDIRECT in the last 72 hours. Thyroid Function Tests: Recent Labs    05/23/20 0607 05/24/20 0639  TSH 6.291*  --   FREET4  --  0.94   Anemia Panel: No results for input(s): VITAMINB12, FOLATE, FERRITIN, TIBC, IRON, RETICCTPCT in the last 72 hours. Sepsis Labs: Recent Labs  Lab 05/23/20 0607  PROCALCITON <0.10    Recent Results (from the past 240 hour(s))  SARS Coronavirus 2 by RT PCR (hospital order, performed in [facility] hospital lab) Nasopharyngeal Nasopharyngeal Swab     Status: None   Collection Time: 05/22/20 11:45 PM   Specimen: Nasopharyngeal  Swab  Result Value Ref Range Status   SARS Coronavirus 2 NEGATIVE NEGATIVE Final    Comment: (NOTE) SARS-CoV-2 target nucleic acids are NOT DETECTED.  The SARS-CoV-2 RNA is generally detectable in upper and lower respiratory specimens during the acute phase of infection. The lowest concentration of SARS-CoV-2 viral copies this assay can detect is 250 copies / mL. A negative result does not preclude SARS-CoV-2 infection and should not be used as the sole basis for treatment or other patient management decisions.  A negative result may occur with improper specimen collection / handling, submission of specimen other than nasopharyngeal swab, presence of viral mutation(s) within the areas targeted by this assay, and inadequate number of viral copies (<250 copies / mL). A negative result must be combined with clinical observations, patient history, and epidemiological information.  Fact Sheet for Patients:   BoilerBrush.com.cy  Fact Sheet for Healthcare Providers: https://pope.com/  This test is not yet approved or  cleared by the [place] FDA and has been authorized for detection and/or  diagnosis of SARS-CoV-2 by FDA under an Emergency Use Authorization (EUA).  This EUA will remain in effect (meaning this test can be used) for the duration of the COVID-19 declaration under Section 564(b)(1) of the Act, 21 U.S.C. section 360bbb-3(b)(1), unless the authorization is terminated or revoked sooner.  Performed at [facility], [address] [place], [place] [address]       Radiology Studies: DG Chest 2 View  Result Date: 05/24/2020 CLINICAL DATA:  Shortness of breath, dry cough with hypertension and diabetes EXAM: CHEST - 2 VIEW COMPARISON:  05/23/2020 FINDINGS: Image slightly rotated the RIGHT. Cardiomediastinal contours are stable with mild fullness of the LEFT suggesting central vascular congestion. Improved aeration in the LEFT chest with interval improvement with respect to interstitial and airspace opacities in this area compared to the previous study. No lobar level consolidation. No effusion. No acute skeletal process to the extent evaluated. IMPRESSION: Mild central vascular congestion on the LEFT in this patient with unilateral absence of RIGHT pulmonary arteries. Findings may reflect improving edema or infection. Electronically Signed   By: Donzetta Kohut M.D.   On: 05/24/2020 08:59   CT Angio Chest PE W and/or Wo Contrast  Result Date: 05/23/2020 CLINICAL DATA:  Shortness of breath and positive D-dimer. EXAM: CT ANGIOGRAPHY CHEST WITH CONTRAST TECHNIQUE: Multidetector CT imaging of the chest was performed using the standard protocol during bolus administration of intravenous contrast. Multiplanar CT image reconstructions and MIPs were obtained to evaluate the vascular anatomy. CONTRAST:  OMNIPAQUE IOHEXOL 350 MG/ML SOLN COMPARISON:  12/10/2019 FINDINGS: Cardiovascular: Normal heart size. No pericardial effusion. Suboptimal pulmonary artery opacification due to primarily to streak artifact and body habitus. No pulmonary filling defects are seen. 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 Mediastinum/Nodes: Negative for adenopathy or mass. Lungs/Pleura: Hypoplastic right lung with emphysematous spaces. Ground-glass opacity in the left upper and lower lobes with areas of air trapping causing mosaic attenuation. No effusion or pneumothorax. Upper Abdomen: Negative Musculoskeletal: No acute or aggressive finding. Review of the MIP images confirms the above findings. IMPRESSION: 1. Patchy airspace disease in the left lung which could reflect infection or unilateral edema in the setting of absent right pulmonary arteries and veins. 2. Dysplastic right lung. 3. No evidence of pulmonary embolism. Electronically Signed   By: Marnee Spring M.D.   On: 05/23/2020 04:50   DG Chest [facility]  Result Date: 05/23/2020 CLINICAL DATA:  Left-sided chest pain EXAM: PORTABLE CHEST 1 VIEW COMPARISON:  12/13/2019 FINDINGS: Right lung is grossly clear. Left lower lung airspace disease concerning for pneumonia. Mild cardiomegaly. No pneumothorax. IMPRESSION: Left lower lung airspace disease concerning for pneumonia. Electronically Signed   By: Jasmine Pang M.D.   On: 05/23/2020 00:51      Scheduled Meds: . amLODipine  10 mg Oral Daily  . aspirin  81 mg Oral Pre-Cath  . aspirin EC  81 mg Oral Daily  . carvedilol  6.25 mg Oral BID WC  . [START ON 05/25/2020] enoxaparin (LOVENOX) injection  100 mg Subcutaneous Q24H  . furosemide  60 mg Intravenous Q12H  . insulin aspart  0-9 Units Subcutaneous TID WC  . levothyroxine  75 mcg Oral QAC breakfast  . mometasone-formoterol  2 puff Inhalation BID  . nitroGLYCERIN  0.4 mg Transdermal Daily  . sodium chloride flush  3 mL Intravenous Q12H  . spironolactone  12.5 mg Oral Daily   Continuous Infusions: . sodium chloride 1,000 mL (05/23/20 0926)  . sodium chloride    . sodium chloride       LOS: 1 day      Time spent: 25 minutes   Noralee Stain,  DO [facility] 05/24/2020, 11:53 AM   Available via Epic secure chat 7am-7pm After these hours, please refer to coverage provider listed on [email or web address]

## 2020-05-24 NOTE — Progress Notes (Addendum)
Advanced Heart Failure Rounding Note  PCP-Cardiologist: No primary care provider on file.   Subjective:    Sleeping but not using CPAP. Wearing [place].  Reports he slept well. No complaints.   On Doxycyline for PNA. WBCs trending down, 13.6>>11.4. AF.   Good diuresis w/ IV Lasix, -4.2L in UOP yesterday. Wt down 6 lb. BMP pending. BP stable.   Objective:   Weight Range: (!) 207.2 kg Body mass index is 60.25 kg/m.   Vital Signs:   Temp:  [98 F (36.7 C)-98.7 F (37.1 C)] 98.1 F (36.7 C) (09/14 0442) Pulse Rate:  [81-104] 89 (09/14 0442) Resp:  [18-26] 20 (09/14 0442) BP: (126-143)/(58-86) 128/61 (09/14 0442) SpO2:  [94 %-100 %] 96 % (09/14 0442) Weight:  [207.2 kg-209.9 kg] 207.2 kg (09/14 0442) Last BM Date: 05/23/20  Weight change: Filed Weights   05/22/20 2354 05/23/20 0843 05/24/20 0442  Weight: (!) 208.7 kg (!) 209.9 kg (!) 207.2 kg    Intake/Output:   Intake/Output Summary (Last 24 hours) at 05/24/2020 0734 Last data filed at 05/24/2020 5883 Gross per 24 hour  Intake 829.74 ml  Output 4200 ml  Net -3370.26 ml      Physical Exam    General:  Well appearing, super morbidly obese. No resp difficulty HEENT: Normal Neck: Supple. Thick neck, JVD assessment difficult . Carotids 2+ bilat; no bruits. No lymphadenopathy or thyromegaly appreciated. Cor: PMI nondisplaced. Regular rate & rhythm. No rubs, gallops or murmurs. Lungs: Clear, no wheezing  Abdomen: obese, soft, nontender, nondistended. No hepatosplenomegaly. No bruits or masses. Good bowel sounds. Extremities: No cyanosis, clubbing, rash, obese extremities, no pitting edema Neuro: Alert & orientedx3, cranial nerves grossly intact. moves all 4 extremities w/o difficulty. Affect pleasant   Telemetry   NSR 80s-90s   EKG    NSR 85 bpm, RAD   Labs    CBC Recent Labs    05/23/20 0032 05/23/20 0607  WBC 13.6* 11.4*  HGB 14.4 13.6  HCT 47.4 45.3  MCV 81.6 82.4  PLT 401* 378   Basic Metabolic  Panel Recent Labs    05/23/20 0032 05/23/20 0607  NA 140  --   K 4.2  --   CL 104  --   CO2 26  --   GLUCOSE 105*  --   BUN 9  --   CREATININE 1.19 1.15  CALCIUM 9.1  --   MG  --  1.9   Liver Function Tests No results for input(s): AST, ALT, ALKPHOS, BILITOT, PROT, ALBUMIN in the last 72 hours. No results for input(s): LIPASE, AMYLASE in the last 72 hours. Cardiac Enzymes No results for input(s): CKTOTAL, CKMB, CKMBINDEX, TROPONINI in the last 72 hours.  BNP: BNP (last 3 results) Recent Labs    06/08/19 1350 12/11/19 0349 05/23/20 0032  BNP 87.4 108.2* 279.2*    ProBNP (last 3 results) No results for input(s): PROBNP in the last 8760 hours.   D-Dimer Recent Labs    05/23/20 0125  DDIMER 0.63*   Hemoglobin A1C No results for input(s): HGBA1C in the last 72 hours. Fasting Lipid Panel No results for input(s): CHOL, HDL, LDLCALC, TRIG, CHOLHDL, LDLDIRECT in the last 72 hours. Thyroid Function Tests Recent Labs    05/23/20 0607  TSH 6.291*    Other results:   Imaging     No results found.   Medications:     Scheduled Medications: . amLODipine  10 mg Oral Daily  . aspirin  81 mg Oral  Pre-Cath  . aspirin EC  81 mg Oral Daily  . carvedilol  6.25 mg Oral BID WC  . doxycycline  100 mg Oral Q12H  . enoxaparin (LOVENOX) injection  40 mg Subcutaneous Q24H  . furosemide  60 mg Intravenous Q12H  . insulin aspart  0-9 Units Subcutaneous TID WC  . levothyroxine  75 mcg Oral QAC breakfast  . mometasone-formoterol  2 puff Inhalation BID  . nitroGLYCERIN  0.4 mg Transdermal Daily  . sodium chloride flush  3 mL Intravenous Q12H  . spironolactone  12.5 mg Oral Daily     Infusions: . sodium chloride 1,000 mL (05/23/20 0926)  . sodium chloride    . sodium chloride       PRN Medications:  sodium chloride, sodium chloride, acetaminophen, albuterol, hydrALAZINE, ondansetron **OR** ondansetron (ZOFRAN) IV, sodium chloride flush    Assessment/Plan    1. 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  The pulmonary veins appear to lead to the left atrium on CTA this admission though the right pulmonary veins are small.  No hemoptysis.  He presents with possible PNA but actually appears to be in the left lung by CTA.  Can develop endothelial dysfunction with pulmonary hypertension in setting of UAPA, patient has dilated/dysfunctional RV on 4/21 echo.  I do not see evidence for pulmonary valve stenosis on 4/21 echo.  2. Pulmonary hypertension/RV failure: Echo in 4/21 with normal LV systolic function, moderately dilated/dysfunctional RV with d-shaped septum.  As above, Can develop endothelial dysfunction with pulmonary hypertension in setting of UAPA (group 1 PH).  However, patient also has OHS/OSA which can cause pulmonary hypertension/RV failure (group 3 PH).  Exam is difficult for volume, suspect volume overload.  - Continue IV Lasix 60 mg bid. Check BMP today  - Continue home oxygen + CPAP (encouraged to use while sleep).  - Will arrange for RHC when he is more diuresed and oxygen requirement is back to baseline, scheduled for Wednesday. Some patients with UAPA appear to have benefits from pulmonary vasodilators in setting of PAH.  3. HTN: Continue amlodipine, Coreg.  Will hold lisinopril for now.  Continue spironolactone 12.5 mg daily. Check BMP  4. ID: Possible left lung PNA.  No fever, WBCs mildly elevated and downtrending.  - Covering with doxycycline.   5. OHS/OSA: On 3L home oxygen and CPAP at home since 3/21.   Length of Stay: 1  Robbie Lis, PA-C  05/24/2020, 7:34 AM  Advanced Heart Failure Team Pager [phone] (M-F; 7a - 4p)  Please contact [facility] for night-coverage after hours (4p -7a ) and weekends on [email or web address]  Patient seen with PA, agree with the above note.   He diuresed well yesterday, I/Os net negative 3970.  Creatinine stable.   General: NAD,  obese Neck: Thick, JVP ?10-12 cm, no thyromegaly or thyroid nodule.  Lungs: Distant BS CV: Nondisplaced PMI.  Heart regular S1/S2, no S3/S4, no murmur.  1+ ankle edema.  Abdomen: Soft, nontender, no hepatosplenomegaly, no distention.  Skin: Intact without lesions or rashes.  Neurologic: Alert and oriented x 3.  Psych: Normal affect. Extremities: No clubbing or cyanosis.  HEENT: Normal.   Volume status improving, still think he has some residual RV failure.   - Continue Lasix 60 mg IV bid today, replace K.  - Continue CPAP at night and oxygen at all other times.  - Repeat CXR, if clearing with diuresis can probably stop doxycycline.  - Plan RHC tentatively tomorrow to  assess PA pressure, ?component of group 1 PH due to endothelial dysfunction over time with high flow through single PA. Discussed risks/benefits with patient, he agrees to procedure.   BP control is adequate.   Marca Ancona 05/24/2020 10:40 AM

## 2020-05-24 NOTE — Progress Notes (Signed)
Subjective:  Breathing better no chest pain   Objective:  Vitals:   05/23/20 2020 05/23/20 2233 05/24/20 0039 05/24/20 0442  BP: (!) 126/58 138/77 (!) 139/59 128/61  Pulse: 86 81 88 89  Resp: (!) 26 20 19 20   Temp: 98.2 F (36.8 C) 98 F (36.7 C)  98.1 F (36.7 C)  TempSrc: Oral Oral  Oral  SpO2: 100% 99% 98% 96%  Weight:    (!) 207.2 kg  Height:        Intake/Output from previous day:  Intake/Output Summary (Last 24 hours) at 05/24/2020 05/26/2020 Last data filed at 05/24/2020 0700 Gross per 24 hour  Intake 829.74 ml  Output 4800 ml  Net -3970.26 ml    Physical Exam: Affect appropriate Obese black male  HEENT: normal Neck supple with no adenopathy JVP normal no bruits no thyromegaly Lungs clear with no wheezing and good diaphragmatic motion Heart:  S1/S2 no murmur, no rub, gallop or click PMI normal Abdomen: benighn, BS positve, no tenderness, no AAA no bruit.  No HSM or HJR Distal pulses intact with no bruits Plus on bilateral edema Neuro non-focal Skin warm and dry No muscular weakness   Lab Results: Basic Metabolic Panel: Recent Labs    05/23/20 0032 05/23/20 0607  NA 140  --   K 4.2  --   CL 104  --   CO2 26  --   GLUCOSE 105*  --   BUN 9  --   CREATININE 1.19 1.15  CALCIUM 9.1  --   MG  --  1.9   Liver Function Tests: No results for input(s): AST, ALT, ALKPHOS, BILITOT, PROT, ALBUMIN in the last 72 hours. No results for input(s): LIPASE, AMYLASE in the last 72 hours. CBC: Recent Labs    05/23/20 0032 05/23/20 0607  WBC 13.6* 11.4*  HGB 14.4 13.6  HCT 47.4 45.3  MCV 81.6 82.4  PLT 401* 378   Cardiac Enzymes: No results for input(s): CKTOTAL, CKMB, CKMBINDEX, TROPONINI in the last 72 hours. BNP: Invalid input(s): POCBNP D-Dimer: Recent Labs    05/23/20 0125  DDIMER 0.63*   Hemoglobin A1C: Recent Labs    05/24/20 0639  HGBA1C 6.5*   Fasting Lipid Panel: No results for input(s): CHOL, HDL, LDLCALC, TRIG, CHOLHDL, LDLDIRECT  in the last 72 hours. Thyroid Function Tests: Recent Labs    05/23/20 0607  TSH 6.291*   Anemia Panel: No results for input(s): VITAMINB12, FOLATE, FERRITIN, TIBC, IRON, RETICCTPCT in the last 72 hours.  Imaging: CT Angio Chest PE W and/or Wo Contrast  Result Date: 05/23/2020 CLINICAL DATA:  Shortness of breath and positive D-dimer. EXAM: CT ANGIOGRAPHY CHEST WITH CONTRAST TECHNIQUE: Multidetector CT imaging of the chest was performed using the standard protocol during bolus administration of intravenous contrast. Multiplanar CT image reconstructions and MIPs were obtained to evaluate the vascular anatomy. CONTRAST:  05/25/2020 OMNIPAQUE IOHEXOL 350 MG/ML SOLN COMPARISON:  12/10/2019 FINDINGS: Cardiovascular: Normal heart size. No pericardial effusion. Suboptimal pulmonary artery opacification due to primarily to streak artifact and body habitus. No pulmonary filling defects are seen. 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 Mediastinum/Nodes: Negative for adenopathy or mass. Lungs/Pleura: Hypoplastic right lung with emphysematous spaces. Ground-glass opacity in the left upper and lower lobes with areas of air trapping causing mosaic attenuation. No effusion or pneumothorax. Upper Abdomen: Negative Musculoskeletal: No acute or aggressive finding. Review of the MIP images confirms the above findings. IMPRESSION: 1.  Patchy airspace disease in the left lung which could reflect infection or unilateral edema in the setting of absent right pulmonary arteries and veins. 2. Dysplastic right lung. 3. No evidence of pulmonary embolism. Electronically Signed   By: Marnee Spring M.D.   On: 05/23/2020 04:50   DG Chest Port 1 View  Result Date: 05/23/2020 CLINICAL DATA:  Left-sided chest pain EXAM: PORTABLE CHEST 1 VIEW COMPARISON:  12/13/2019 FINDINGS: Right lung is grossly clear. Left lower lung airspace disease concerning for pneumonia. Mild  cardiomegaly. No pneumothorax. IMPRESSION: Left lower lung airspace disease concerning for pneumonia. Electronically Signed   By: Jasmine Pang M.D.   On: 05/23/2020 00:51    Cardiac Studies:  ECG: NSR normal    Telemetry:  SR no arrhythmia   Echo: 4/21 moderate RVE/hypokinesis normal valves no PS normal LVEF  Medications:   . amLODipine  10 mg Oral Daily  . aspirin  81 mg Oral Pre-Cath  . aspirin EC  81 mg Oral Daily  . carvedilol  6.25 mg Oral BID WC  . doxycycline  100 mg Oral Q12H  . enoxaparin (LOVENOX) injection  40 mg Subcutaneous Q24H  . furosemide  60 mg Intravenous Q12H  . insulin aspart  0-9 Units Subcutaneous TID WC  . levothyroxine  75 mcg Oral QAC breakfast  . mometasone-formoterol  2 puff Inhalation BID  . nitroGLYCERIN  0.4 mg Transdermal Daily  . sodium chloride flush  3 mL Intravenous Q12H  . spironolactone  12.5 mg Oral Daily     . sodium chloride 1,000 mL (05/23/20 0926)  . sodium chloride    . sodium chloride      Assessment/Plan:   1. 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 Diuresed well. SSCP not cardiac chronic mild elevation in troponin  No acute ECG changes CT with normal coronary origins and no calcium. Plan for right heart cath in am to see if PCWP low and PVR high ? Of label use of vasodilator Rx  Being seen by CHF team Continue doxycycline He is not likely to do well long term from a pulmonary perspective as his left lung has a lot of parenchymal disease Would have pulmonary see in house  Benjamin Rosario 05/24/2020, 8:07 AM

## 2020-05-25 ENCOUNTER — Encounter (HOSPITAL_COMMUNITY): Payer: Self-pay | Admitting: Cardiology

## 2020-05-25 ENCOUNTER — Telehealth: Payer: Self-pay | Admitting: Acute Care

## 2020-05-25 ENCOUNTER — Encounter (HOSPITAL_COMMUNITY)
Admission: EM | Disposition: A | Discharge status: Home / Self Care | Payer: Self-pay | Source: Home / Self Care | Attending: Internal Medicine

## 2020-05-25 DIAGNOSIS — I50811 Acute right heart failure: Secondary | ICD-10-CM | POA: Diagnosis present

## 2020-05-25 DIAGNOSIS — I248 Other forms of acute ischemic heart disease: Secondary | ICD-10-CM

## 2020-05-25 DIAGNOSIS — E662 Morbid (severe) obesity with alveolar hypoventilation: Secondary | ICD-10-CM

## 2020-05-25 DIAGNOSIS — E119 Type 2 diabetes mellitus without complications: Secondary | ICD-10-CM

## 2020-05-25 DIAGNOSIS — Z6841 Body Mass Index (BMI) 40.0 and over, adult: Secondary | ICD-10-CM

## 2020-05-25 DIAGNOSIS — J9601 Acute respiratory failure with hypoxia: Secondary | ICD-10-CM

## 2020-05-25 DIAGNOSIS — G4733 Obstructive sleep apnea (adult) (pediatric): Secondary | ICD-10-CM

## 2020-05-25 DIAGNOSIS — I50810 Right heart failure, unspecified: Secondary | ICD-10-CM

## 2020-05-25 HISTORY — PX: RIGHT HEART CATH: CATH118263

## 2020-05-25 LAB — POCT I-STAT EG7
Acid-Base Excess: 5 mmol/L — ABNORMAL HIGH (ref 0.0–2.0)
Acid-Base Excess: 5 mmol/L — ABNORMAL HIGH (ref 0.0–2.0)
Acid-Base Excess: 5 mmol/L — ABNORMAL HIGH (ref 0.0–2.0)
Acid-Base Excess: 6 mmol/L — ABNORMAL HIGH (ref 0.0–2.0)
Acid-Base Excess: 6 mmol/L — ABNORMAL HIGH (ref 0.0–2.0)
Acid-Base Excess: 6 mmol/L — ABNORMAL HIGH (ref 0.0–2.0)
Bicarbonate: 30.7 mmol/L — ABNORMAL HIGH (ref 20.0–28.0)
Bicarbonate: 30.2 mmol/L — ABNORMAL HIGH (ref 20.0–28.0)
Bicarbonate: 30.7 mmol/L — ABNORMAL HIGH (ref 20.0–28.0)
Bicarbonate: 30.8 mmol/L — ABNORMAL HIGH (ref 20.0–28.0)
Bicarbonate: 31.2 mmol/L — ABNORMAL HIGH (ref 20.0–28.0)
Bicarbonate: 31.6 mmol/L — ABNORMAL HIGH (ref 20.0–28.0)
Calcium, Ion: 1.18 mmol/L (ref 1.15–1.40)
Calcium, Ion: 1.09 mmol/L — ABNORMAL LOW (ref 1.15–1.40)
Calcium, Ion: 1.17 mmol/L (ref 1.15–1.40)
Calcium, Ion: 1.17 mmol/L (ref 1.15–1.40)
Calcium, Ion: 1.18 mmol/L (ref 1.15–1.40)
Calcium, Ion: 1.19 mmol/L (ref 1.15–1.40)
HCT: 47 % (ref 39.0–52.0)
HCT: 45 % (ref 39.0–52.0)
HCT: 46 % (ref 39.0–52.0)
HCT: 46 % (ref 39.0–52.0)
HCT: 46 % (ref 39.0–52.0)
HCT: 47 % (ref 39.0–52.0)
Hemoglobin: 16 g/dL (ref 13.0–17.0)
Hemoglobin: 15.3 g/dL (ref 13.0–17.0)
Hemoglobin: 15.6 g/dL (ref 13.0–17.0)
Hemoglobin: 15.6 g/dL (ref 13.0–17.0)
Hemoglobin: 15.6 g/dL (ref 13.0–17.0)
Hemoglobin: 16 g/dL (ref 13.0–17.0)
O2 Saturation: 63 %
O2 Saturation: 64 %
O2 Saturation: 70 %
O2 Saturation: 71 %
O2 Saturation: 72 %
O2 Saturation: 72 %
Potassium: 3.9 mmol/L (ref 3.5–5.1)
Potassium: 3.8 mmol/L (ref 3.5–5.1)
Potassium: 3.8 mmol/L (ref 3.5–5.1)
Potassium: 3.9 mmol/L (ref 3.5–5.1)
Potassium: 4 mmol/L (ref 3.5–5.1)
Potassium: 4 mmol/L (ref 3.5–5.1)
Sodium: 142 mmol/L (ref 135–145)
Sodium: 141 mmol/L (ref 135–145)
Sodium: 141 mmol/L (ref 135–145)
Sodium: 142 mmol/L (ref 135–145)
Sodium: 142 mmol/L (ref 135–145)
Sodium: 142 mmol/L (ref 135–145)
TCO2: 32 mmol/L (ref 22–32)
TCO2: 32 mmol/L (ref 22–32)
TCO2: 32 mmol/L (ref 22–32)
TCO2: 32 mmol/L (ref 22–32)
TCO2: 33 mmol/L — ABNORMAL HIGH (ref 22–32)
TCO2: 33 mmol/L — ABNORMAL HIGH (ref 22–32)
pCO2, Ven: 49.4 mmHg (ref 44.0–60.0)
pCO2, Ven: 43.7 mmHg — ABNORMAL LOW (ref 44.0–60.0)
pCO2, Ven: 45.4 mmHg (ref 44.0–60.0)
pCO2, Ven: 47.8 mmHg (ref 44.0–60.0)
pCO2, Ven: 47.8 mmHg (ref 44.0–60.0)
pCO2, Ven: 47.9 mmHg (ref 44.0–60.0)
pH, Ven: 7.401 (ref 7.250–7.430)
pH, Ven: 7.414 (ref 7.250–7.430)
pH, Ven: 7.422 (ref 7.250–7.430)
pH, Ven: 7.428 (ref 7.250–7.430)
pH, Ven: 7.439 — ABNORMAL HIGH (ref 7.250–7.430)
pH, Ven: 7.448 — ABNORMAL HIGH (ref 7.250–7.430)
pO2, Ven: 33 mmHg (ref 32.0–45.0)
pO2, Ven: 33 mmHg (ref 32.0–45.0)
pO2, Ven: 36 mmHg (ref 32.0–45.0)
pO2, Ven: 36 mmHg (ref 32.0–45.0)
pO2, Ven: 37 mmHg (ref 32.0–45.0)
pO2, Ven: 37 mmHg (ref 32.0–45.0)

## 2020-05-25 LAB — BASIC METABOLIC PANEL
Anion gap: 14 (ref 5–15)
BUN: 13 mg/dL (ref 6–20)
CO2: 25 mmol/L (ref 22–32)
Calcium: 9.4 mg/dL (ref 8.9–10.3)
Chloride: 101 mmol/L (ref 98–111)
Creatinine, Ser: 1 mg/dL (ref 0.61–1.24)
GFR calc Af Amer: >60 mL/min (ref >60)
GFR calc non Af Amer: >60 mL/min (ref >60)
Glucose, Bld: 86 mg/dL (ref 70–99)
Potassium: 4 mmol/L (ref 3.5–5.1)
Sodium: 140 mmol/L (ref 135–145)

## 2020-05-25 LAB — GLUCOSE, CAPILLARY
Glucose-Capillary: 90 mg/dL (ref 70–99)
Glucose-Capillary: 86 mg/dL (ref 70–99)
Glucose-Capillary: 91 mg/dL (ref 70–99)
Glucose-Capillary: 87 mg/dL (ref 70–99)

## 2020-05-25 LAB — CBC
HCT: 50.3 % (ref 39.0–52.0)
Hemoglobin: 15.1 g/dL (ref 13.0–17.0)
MCH: 24.4 pg — ABNORMAL LOW (ref 26.0–34.0)
MCHC: 30 g/dL (ref 30.0–36.0)
MCV: 81.1 fL (ref 80.0–100.0)
Platelets: 390 10*3/uL (ref 150–400)
RBC: 6.2 MIL/uL — ABNORMAL HIGH (ref 4.22–5.81)
RDW: 17 % — ABNORMAL HIGH (ref 11.5–15.5)
WBC: 9.9 10*3/uL (ref 4.0–10.5)
nRBC: 0 % (ref 0.0–0.2)

## 2020-05-25 SURGERY — RIGHT HEART CATH
Anesthesia: LOCAL

## 2020-05-25 MED ORDER — HEPARIN (PORCINE) IN NACL 1000-0.9 UT/500ML-% IV SOLN
INTRAVENOUS | Status: AC
  Filled 2020-05-25: qty 500

## 2020-05-25 MED ORDER — IOHEXOL 350 MG/ML SOLN
INTRAVENOUS | Status: DC | PRN
  Administered 2020-05-25: 5 mL

## 2020-05-25 MED ORDER — LIDOCAINE HCL (PF) 1 % IJ SOLN
INTRAMUSCULAR | Status: DC | PRN
  Administered 2020-05-25: 1 mL

## 2020-05-25 MED ORDER — DEXTROSE 50 % IV SOLN
INTRAVENOUS | Status: AC
  Administered 2020-05-25: 25 mL
  Filled 2020-05-25: qty 50

## 2020-05-25 MED ORDER — SODIUM CHLORIDE 0.9 % IV SOLN: INTRAVENOUS | Status: DC

## 2020-05-25 MED ORDER — LIDOCAINE HCL (PF) 1 % IJ SOLN
INTRAMUSCULAR | Status: AC
  Filled 2020-05-25: qty 30

## 2020-05-25 MED ORDER — HEPARIN (PORCINE) IN NACL 1000-0.9 UT/500ML-% IV SOLN
INTRAVENOUS | Status: DC | PRN
  Administered 2020-05-25: 500 mL

## 2020-05-25 MED ORDER — FUROSEMIDE 40 MG PO TABS: 60.0000 mg | ORAL_TABLET | Freq: Two times a day (BID) | ORAL | Status: DC

## 2020-05-25 MED ORDER — FUROSEMIDE 40 MG PO TABS
40.0000 mg | ORAL_TABLET | Freq: Every day | ORAL | Status: DC
  Administered 2020-05-26: 40 mg via ORAL
  Filled 2020-05-25 (×2): qty 1

## 2020-05-25 SURGICAL SUPPLY — 6 items
CATH BALLN WEDGE 5F 110CM (CATHETERS) ×1 IMPLANT
PACK CARDIAC CATHETERIZATION (CUSTOM PROCEDURE TRAY) ×2 IMPLANT
SHEATH GLIDE SLENDER 4/5FR (SHEATH) ×1 IMPLANT
TRANSDUCER W/STOPCOCK (MISCELLANEOUS) ×2 IMPLANT
TUBING ART PRESS 72  MALE/FEM (TUBING) ×4
TUBING ART PRESS 72 MALE/FEM (TUBING) IMPLANT

## 2020-05-25 NOTE — Consult Note (Signed)
NAME:  Benjamin Rosario, MRN:  [id number], DOB:  03/17/2001, LOS: 2 ADMISSION DATE:  05/22/2020, CONSULTATION DATE:  05/23/20 REFERRING MD:  Dr Alvino Chapel, TR[facility], CHIEF COMPLAINT:  Hypoxemic resp failure   Brief History   19 yo morbidly obese, hx OSA non-compliant CPAP, congenital absence of R pulmonary artery, presents w hypoxic respiratory failure.   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 Poor CPAP compliance - wears it 2 night s a week.  He had COVID-19 pneumonia in January 2021 with associated chronic hypoxic respiratory failure - he has O2, uses prn based on how he feels.  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  He presented to the ED 9/12 with hypertension, dyspnea, chest discomfort, increased oxygen need, 6 L/min. He was diuresed, received NTG.   Past Medical History   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/25/2018   Environmental allergies    Hypothyroidism 11/25/2018    Significant Hospital Events     Consults:  Cardiology   Procedures:   9/25 RHC>>   Significant Diagnostic Tests:  CT-PA 6/13 >> congenitally absent right pulmonary artery.  No PE.  The right and left pulmonary veins appear to empty into the left atrium.  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  Left diffuse groundglass opacities without effusion or pneumothorax.  Micro Data:  SARSCoV2 9/12 >> negative  Antimicrobials:  Doxycycline 9/13 >9/14  Interim history/subjective:   NAEO Feeling thirsty while NPO for RHC   Voicing determination to be more compliant with CPAP.   Objective   Blood pressure 139/87, pulse  82, temperature (!) 97.4 F (36.3 C), temperature source Oral, resp. rate 20, height 6\' 1"  (1.854 m), weight (!) 204.3 kg, SpO2 94 %.        Intake/Output Summary (Last 24 hours) at 05/25/2020 1020 Last data filed at 05/25/2020 0800 Gross per 24 hour  Intake 585 ml  Output 3150 ml  Net -2565 ml   Filed Weights   05/23/20 0843 05/24/20 0442 05/25/20 0624  Weight: (!) 209.9 kg (!) 207.2 kg (!) 204.3 kg    Examination: General: Pleasant morbidly obese man, able to lay supine without difficulty on room air HENT: Oropharynx clear, pupils equal Lungs: Clear on the right, few inspiratory crackles on the left but mostly clear there as well.  No wheezing Cardiovascular: Regular, distant, no murmur Abdomen: Obese, nondistended, positive bowel sounds Extremities: Large ankles but no obvious edema Neuro: Awake, alert, interacting appropriately, answers all questions, follows commands.  Good strength  Resolved Hospital Problem list     Assessment & Plan:   Acute on chronic hypoxemic respiratory failure and patient with congenitally absent right PA, OSA/OHS, presumed secondary pulmonary hypertension -poor home CPAP and O2 compliance likely contributing to secondary PAH, in addition to unilateral pulmonary artery.  -abx dc 9/14 with low suspicion for PNA -has been responsive to lasix P -Needs CPAP, Supplemental O2 compliance. Long discussion w pt about this 9/15, he voices resolve to use -Supplemental O2 as needing for SpO2 >90% -agree with ongoing diuresis -Followed by [facility]-- will coordinate hospital follow up from pulm/sleep perspective  -Going for RHC 9/15 for PAH/RV failure, further recs per  heart failure    Labs   CBC: Recent Labs  Lab 05/23/20 0032 05/23/20 0607 05/24/20 0639 05/25/20 0705  WBC 13.6* 11.4* 10.8* 9.9  HGB 14.4 13.6 13.8 15.1  HCT 47.4 45.3 45.9 50.3  MCV 81.6 82.4 80.4 81.1  PLT 401* 378 378 390    Basic Metabolic Panel: Recent Labs  Lab  05/23/20 0032 05/23/20 0607 05/24/20 0639 05/25/20 0705  NA 140  --  137 140  K 4.2  --  3.7 4.0  CL 104  --  98 101  CO2 26  --  28 25  GLUCOSE 105*  --  78 86  BUN 9  --  12 13  CREATININE 1.19 1.15 1.04 1.00  CALCIUM 9.1  --  8.9 9.4  MG  --  1.9 2.0  --    GFR: Estimated Creatinine Clearance: 216.2 mL/min (by C-G formula based on SCr of 1 mg/dL). Recent Labs  Lab 05/23/20 0032 05/23/20 0607 05/24/20 0639 05/25/20 0705  PROCALCITON  --  <0.10  --   --   WBC 13.6* 11.4* 10.8* 9.9    Liver Function Tests: No results for input(s): AST, ALT, ALKPHOS, BILITOT, PROT, ALBUMIN in the last 168 hours. No results for input(s): LIPASE, AMYLASE in the last 168 hours. No results for input(s): AMMONIA in the last 168 hours.  ABG    Component Value Date/Time   PHART 7.350 12/13/2019 0253   PCO2ART 45.6 12/13/2019 0253   PO2ART 79.0 (L) 12/13/2019 0253   HCO3 25.1 12/13/2019 0253   TCO2 26 12/13/2019 0253   ACIDBASEDEF 1.0 12/13/2019 0253   O2SAT 95.0 12/13/2019 0253     Coagulation Profile: No results for input(s): INR, PROTIME in the last 168 hours.  Cardiac Enzymes: No results for input(s): CKTOTAL, CKMB, CKMBINDEX, TROPONINI in the last 168 hours.  HbA1C: Hgb A1c MFr Bld  Date/Time Value Ref Range Status  05/24/2020 06:39 AM 6.5 (H) 4.8 - 5.6 % Final    Comment:    (NOTE) Pre diabetes:          5.7%-6.4%  Diabetes:              >6.4%  Glycemic control for   <7.0% adults with diabetes   12/11/2019 03:49 AM 7.1 (H) 4.8 - 5.6 % Final    Comment:    (NOTE) Pre diabetes:          5.7%-6.4% Diabetes:              >6.4% Glycemic control for   <7.0% adults with diabetes     CBG: Recent Labs  Lab 05/24/20 0647 05/24/20 1227 05/24/20 1626 05/24/20 2154 05/25/20 0610  GLUCAP 95 109* 97 85 90     Tessie Fass MSN, AGACNP-BC [facility] [phone] If no answer, [phone] 05/25/2020, 10:20 AM

## 2020-05-25 NOTE — Assessment & Plan Note (Signed)
CPAP QHS

## 2020-05-25 NOTE — Assessment & Plan Note (Signed)
--  continue management per cardiology --s/p RHC today --plan for TEE tomorrow to exclude ASD

## 2020-05-25 NOTE — Assessment & Plan Note (Signed)
-  secondary to acute CHF --no treatment indicated

## 2020-05-25 NOTE — Telephone Encounter (Signed)
Patient contacted, follow up appointment reviewed. This is a 2 week hospital follow up requested by inpatient nurse practitioner.

## 2020-05-25 NOTE — Assessment & Plan Note (Signed)
resolved 

## 2020-05-25 NOTE — Assessment & Plan Note (Signed)
-   continue SSI  ?

## 2020-05-25 NOTE — Progress Notes (Addendum)
Attempted to start IV 3x. IV team was not able to get a 2nd PIV access. Will try again later. Will relay to primary day RN

## 2020-05-25 NOTE — Hospital Course (Signed)
19 year old man PMH morbid obesity, RV dysfunction presented with increasing shortness of breath.  Admitted for acute respiratory failure and acute right heart failure.

## 2020-05-25 NOTE — Progress Notes (Addendum)
Advanced Heart Failure Rounding Note  PCP-Cardiologist: No primary care provider on file.   Subjective:    -2.5L in UOP charted yesterday. Bedside urinal w/ clear urine. SCr stable. Wt down an additional 6 lb.   Repeat CXR yesterday showed improved aeration in the left chest with interval improvement with respect to interstitial and airspace opacities in this area compared to the previous study. WBC continue to trend down. AF.   No complaints today.   On for RHC today.     Objective:   Weight Range: (!) 204.3 kg Body mass index is 59.42 kg/m.   Vital Signs:   Temp:  [97.4 F (36.3 C)-98.9 F (37.2 C)] 97.4 F (36.3 C) (09/14 2105) Pulse Rate:  [79-83] 82 (09/14 2105) Resp:  [16-20] 20 (09/14 2105) BP: (103-134)/(51-81) 103/51 (09/14 2105) SpO2:  [91 %-97 %] 97 % (09/14 2105) Weight:  [204.3 kg] 204.3 kg (09/15 0624) Last BM Date: 05/23/20  Weight change: Filed Weights   05/23/20 0843 05/24/20 0442 05/25/20 0624  Weight: (!) 209.9 kg (!) 207.2 kg (!) 204.3 kg    Intake/Output:   Intake/Output Summary (Last 24 hours) at 05/25/2020 0736 Last data filed at 05/25/2020 0612 Gross per 24 hour  Intake 1128 ml  Output 2450 ml  Net -1322 ml      Physical Exam    General:  Young well appearing, super morbidly obese AAM. No resp difficulty HEENT: Normal Neck: Supple. Thick neck, JVD assessment difficult . Carotids 2+ bilat; no bruits. No lymphadenopathy or thyromegaly appreciated. Cor: PMI nondisplaced. Regular rate & rhythm. No rubs, gallops or murmurs. Lungs: CTAB Abdomen: soft, nontender, nondistended. No hepatosplenomegaly. No bruits or masses. Good bowel sounds. Extremities: No cyanosis, clubbing, rash, obese extremities, no pitting edema Neuro: Alert & orientedx3, cranial nerves grossly intact. moves all 4 extremities w/o difficulty. Affect pleasant   Telemetry   NSR 80s. No arrhthymias   EKG    No new EKG to review   Labs    CBC Recent Labs      05/23/20 0607 05/24/20 0639  WBC 11.4* 10.8*  HGB 13.6 13.8  HCT 45.3 45.9  MCV 82.4 80.4  PLT 378 378   Basic Metabolic Panel Recent Labs    76/54/65 0032 05/23/20 0032 05/23/20 0607 05/24/20 0639  NA 140  --   --  137  K 4.2  --   --  3.7  CL 104  --   --  98  CO2 26  --   --  28  GLUCOSE 105*  --   --  78  BUN 9  --   --  12  CREATININE 1.19   < > 1.15 1.04  CALCIUM 9.1  --   --  8.9  MG  --   --  1.9 2.0   < > = values in this interval not displayed.   Liver Function Tests No results for input(s): AST, ALT, ALKPHOS, BILITOT, PROT, ALBUMIN in the last 72 hours. No results for input(s): LIPASE, AMYLASE in the last 72 hours. Cardiac Enzymes No results for input(s): CKTOTAL, CKMB, CKMBINDEX, TROPONINI in the last 72 hours.  BNP: BNP (last 3 results) Recent Labs    06/08/19 1350 12/11/19 0349 05/23/20 0032  BNP 87.4 108.2* 279.2*    ProBNP (last 3 results) No results for input(s): PROBNP in the last 8760 hours.   D-Dimer Recent Labs    05/23/20 0125  DDIMER 0.63*   Hemoglobin A1C Recent Labs  05/24/20 0639  HGBA1C 6.5*   Fasting Lipid Panel No results for input(s): CHOL, HDL, LDLCALC, TRIG, CHOLHDL, LDLDIRECT in the last 72 hours. Thyroid Function Tests Recent Labs    05/23/20 0607  TSH 6.291*    Other results:   Imaging    DG Chest 2 View  Result Date: 05/24/2020 CLINICAL DATA:  Shortness of breath, dry cough with hypertension and diabetes EXAM: CHEST - 2 VIEW COMPARISON:  05/23/2020 FINDINGS: Image slightly rotated the RIGHT. Cardiomediastinal contours are stable with mild fullness of the LEFT suggesting central vascular congestion. Improved aeration in the LEFT chest with interval improvement with respect to interstitial and airspace opacities in this area compared to the previous study. No lobar level consolidation. No effusion. No acute skeletal process to the extent evaluated. IMPRESSION: Mild central vascular congestion on the  LEFT in this patient with unilateral absence of RIGHT pulmonary arteries. Findings may reflect improving edema or infection. Electronically Signed   By: Donzetta Kohut M.D.   On: 05/24/2020 08:59     Medications:     Scheduled Medications: . amLODipine  10 mg Oral Daily  . aspirin EC  81 mg Oral Daily  . carvedilol  6.25 mg Oral BID WC  . enoxaparin (LOVENOX) injection  100 mg Subcutaneous Q24H  . furosemide  60 mg Intravenous Q12H  . insulin aspart  0-9 Units Subcutaneous TID WC  . levothyroxine  75 mcg Oral QAC breakfast  . mometasone-formoterol  2 puff Inhalation BID  . nitroGLYCERIN  0.4 mg Transdermal Daily  . sodium chloride flush  3 mL Intravenous Q12H  . spironolactone  12.5 mg Oral Daily    Infusions: . sodium chloride 1,000 mL (05/23/20 0926)  . sodium chloride    . sodium chloride 10 mL/hr at 05/25/20 0558    PRN Medications: sodium chloride, sodium chloride, acetaminophen, albuterol, hydrALAZINE, ondansetron **OR** ondansetron (ZOFRAN) IV, sodium chloride flush    Assessment/Plan   1. 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  The pulmonary veins appear to lead to the left atrium on CTA this admission though the right pulmonary veins are small.  No hemoptysis.  He presents with possible PNA but actually appears to be in the left lung by CTA.  Can develop endothelial dysfunction with pulmonary hypertension in setting of UAPA, patient has dilated/dysfunctional RV on 4/21 echo.  I do not see evidence for pulmonary valve stenosis on 4/21 echo.  2. Pulmonary hypertension/RV failure: Echo in 4/21 with normal LV systolic function, moderately dilated/dysfunctional RV with d-shaped septum.  As above, Can develop endothelial dysfunction with pulmonary hypertension in setting of UAPA (group 1 PH).  However, patient also has OHS/OSA which can cause pulmonary hypertension/RV failure (group 3 PH).  Exam is difficult for  volume, suspect volume overload.  - Good response to IV Lasix. Wt down 12 lb. Plan RHC today. Continued diuresis based on findings  - Continue home oxygen + CPAP (encouraged to use while sleep).  - Plan RHC today for further assessment. Some patients with UAPA appear to have benefits from pulmonary vasodilators in setting of PAH.  3. HTN: Well controlled. Continue amlodipine, Coreg and spironolactone.  4. ID: Possible left lung PNA.  No fever, WBCs mildly elevated and downtrending.  - Covering with doxycycline.   5. OHS/OSA: On 3L home oxygen and CPAP at home since 3/21.   Length of Stay: 2  Robbie Lis, PA-C  05/25/2020, 7:36 AM  Advanced Heart Failure Team Pager  [phone] (M-F; 7a - 4p)  Please contact [facility] for night-coverage after hours (4p -7a ) and weekends on [email or web address]  RHC done today.    RHC Procedural Findings: Hemodynamics (mmHg) RA mean 6 RV 42/2 PA 59/29, mean 40 PCWP mean 11 Oxygen saturations: SVC 64% RA 72% RV 72% PA 71% AO 100% Cardiac Output (Fick) 7.07  Cardiac Index (Fick) 2.31 PVR 4.1 WU  Patient has normal filling pressures after diuresis.  Moderate pulmonary arterial hypertension.  Preserved cardiac output. There is a step-up in oxygen saturation between SVC and RA (64% => 72%), shunt fraction 1.3.  Consider ASD.  Will plan TEE tomorrow to rule out significant ASD, though not a particularly large shunt fraction.  If no significant ASD, would favor trial of pulmonary vasodilator (start with Opsumit).   He can stop Lasix IV and transition to po.   Marca Ancona 05/25/2020 3:53 PM

## 2020-05-25 NOTE — Assessment & Plan Note (Signed)
--  Resolving.  Now on room air.

## 2020-05-25 NOTE — H&P (View-Only) (Signed)
Advanced Heart Failure Rounding Note  PCP-Cardiologist: No primary care provider on file.   Subjective:    -2.5L in UOP charted yesterday. Bedside urinal w/ clear urine. SCr stable. Wt down an additional 6 lb.   Repeat CXR yesterday showed improved aeration in the left chest with interval improvement with respect to interstitial and airspace opacities in this area compared to the previous study. WBC continue to trend down. AF.   No complaints today.   On for RHC today.     Objective:   Weight Range: (!) 204.3 kg Body mass index is 59.42 kg/m.   Vital Signs:   Temp:  [97.4 F (36.3 C)-98.9 F (37.2 C)] 97.4 F (36.3 C) (09/14 2105) Pulse Rate:  [79-83] 82 (09/14 2105) Resp:  [16-20] 20 (09/14 2105) BP: (103-134)/(51-81) 103/51 (09/14 2105) SpO2:  [91 %-97 %] 97 % (09/14 2105) Weight:  [204.3 kg] 204.3 kg (09/15 0624) Last BM Date: 05/23/20  Weight change: Filed Weights   05/23/20 0843 05/24/20 0442 05/25/20 0624  Weight: (!) 209.9 kg (!) 207.2 kg (!) 204.3 kg    Intake/Output:   Intake/Output Summary (Last 24 hours) at 05/25/2020 0736 Last data filed at 05/25/2020 0612 Gross per 24 hour  Intake 1128 ml  Output 2450 ml  Net -1322 ml      Physical Exam    General:  Young well appearing, super morbidly obese AAM. No resp difficulty HEENT: Normal Neck: Supple. Thick neck, JVD assessment difficult . Carotids 2+ bilat; no bruits. No lymphadenopathy or thyromegaly appreciated. Cor: PMI nondisplaced. Regular rate & rhythm. No rubs, gallops or murmurs. Lungs: CTAB Abdomen: soft, nontender, nondistended. No hepatosplenomegaly. No bruits or masses. Good bowel sounds. Extremities: No cyanosis, clubbing, rash, obese extremities, no pitting edema Neuro: Alert & orientedx3, cranial nerves grossly intact. moves all 4 extremities w/o difficulty. Affect pleasant   Telemetry   NSR 80s. No arrhthymias   EKG    No new EKG to review   Labs    CBC Recent Labs      05/23/20 0607 05/24/20 0639  WBC 11.4* 10.8*  HGB 13.6 13.8  HCT 45.3 45.9  MCV 82.4 80.4  PLT 378 378   Basic Metabolic Panel Recent Labs    76/54/65 0032 05/23/20 0032 05/23/20 0607 05/24/20 0639  NA 140  --   --  137  K 4.2  --   --  3.7  CL 104  --   --  98  CO2 26  --   --  28  GLUCOSE 105*  --   --  78  BUN 9  --   --  12  CREATININE 1.19   < > 1.15 1.04  CALCIUM 9.1  --   --  8.9  MG  --   --  1.9 2.0   < > = values in this interval not displayed.   Liver Function Tests No results for input(s): AST, ALT, ALKPHOS, BILITOT, PROT, ALBUMIN in the last 72 hours. No results for input(s): LIPASE, AMYLASE in the last 72 hours. Cardiac Enzymes No results for input(s): CKTOTAL, CKMB, CKMBINDEX, TROPONINI in the last 72 hours.  BNP: BNP (last 3 results) Recent Labs    06/08/19 1350 12/11/19 0349 05/23/20 0032  BNP 87.4 108.2* 279.2*    ProBNP (last 3 results) No results for input(s): PROBNP in the last 8760 hours.   D-Dimer Recent Labs    05/23/20 0125  DDIMER 0.63*   Hemoglobin A1C Recent Labs  05/24/20 0639  HGBA1C 6.5*   Fasting Lipid Panel No results for input(s): CHOL, HDL, LDLCALC, TRIG, CHOLHDL, LDLDIRECT in the last 72 hours. Thyroid Function Tests Recent Labs    05/23/20 0607  TSH 6.291*    Other results:   Imaging    DG Chest 2 View  Result Date: 05/24/2020 CLINICAL DATA:  Shortness of breath, dry cough with hypertension and diabetes EXAM: CHEST - 2 VIEW COMPARISON:  05/23/2020 FINDINGS: Image slightly rotated the RIGHT. Cardiomediastinal contours are stable with mild fullness of the LEFT suggesting central vascular congestion. Improved aeration in the LEFT chest with interval improvement with respect to interstitial and airspace opacities in this area compared to the previous study. No lobar level consolidation. No effusion. No acute skeletal process to the extent evaluated. IMPRESSION: Mild central vascular congestion on the  LEFT in this patient with unilateral absence of RIGHT pulmonary arteries. Findings may reflect improving edema or infection. Electronically Signed   By: Donzetta Kohut M.D.   On: 05/24/2020 08:59     Medications:     Scheduled Medications: . amLODipine  10 mg Oral Daily  . aspirin EC  81 mg Oral Daily  . carvedilol  6.25 mg Oral BID WC  . enoxaparin (LOVENOX) injection  100 mg Subcutaneous Q24H  . furosemide  60 mg Intravenous Q12H  . insulin aspart  0-9 Units Subcutaneous TID WC  . levothyroxine  75 mcg Oral QAC breakfast  . mometasone-formoterol  2 puff Inhalation BID  . nitroGLYCERIN  0.4 mg Transdermal Daily  . sodium chloride flush  3 mL Intravenous Q12H  . spironolactone  12.5 mg Oral Daily    Infusions: . sodium chloride 1,000 mL (05/23/20 0926)  . sodium chloride    . sodium chloride 10 mL/hr at 05/25/20 0558    PRN Medications: sodium chloride, sodium chloride, acetaminophen, albuterol, hydrALAZINE, ondansetron **OR** ondansetron (ZOFRAN) IV, sodium chloride flush    Assessment/Plan   1. 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  The pulmonary veins appear to lead to the left atrium on CTA this admission though the right pulmonary veins are small.  No hemoptysis.  He presents with possible PNA but actually appears to be in the left lung by CTA.  Can develop endothelial dysfunction with pulmonary hypertension in setting of UAPA, patient has dilated/dysfunctional RV on 4/21 echo.  I do not see evidence for pulmonary valve stenosis on 4/21 echo.  2. Pulmonary hypertension/RV failure: Echo in 4/21 with normal LV systolic function, moderately dilated/dysfunctional RV with d-shaped septum.  As above, Can develop endothelial dysfunction with pulmonary hypertension in setting of UAPA (group 1 PH).  However, patient also has OHS/OSA which can cause pulmonary hypertension/RV failure (group 3 PH).  Exam is difficult for  volume, suspect volume overload.  - Good response to IV Lasix. Wt down 12 lb. Plan RHC today. Continued diuresis based on findings  - Continue home oxygen + CPAP (encouraged to use while sleep).  - Plan RHC today for further assessment. Some patients with UAPA appear to have benefits from pulmonary vasodilators in setting of PAH.  3. HTN: Well controlled. Continue amlodipine, Coreg and spironolactone.  4. ID: Possible left lung PNA.  No fever, WBCs mildly elevated and downtrending.  - Covering with doxycycline.   5. OHS/OSA: On 3L home oxygen and CPAP at home since 3/21.   Length of Stay: 2  Robbie Lis, PA-C  05/25/2020, 7:36 AM  Advanced Heart Failure Team Pager  [phone] (M-F; 7a - 4p)  Please contact [facility] for night-coverage after hours (4p -7a ) and weekends on [email or web address]  RHC done today.    RHC Procedural Findings: Hemodynamics (mmHg) RA mean 6 RV 42/2 PA 59/29, mean 40 PCWP mean 11 Oxygen saturations: SVC 64% RA 72% RV 72% PA 71% AO 100% Cardiac Output (Fick) 7.07  Cardiac Index (Fick) 2.31 PVR 4.1 WU  Patient has normal filling pressures after diuresis.  Moderate pulmonary arterial hypertension.  Preserved cardiac output. There is a step-up in oxygen saturation between SVC and RA (64% => 72%), shunt fraction 1.3.  Consider ASD.  Will plan TEE tomorrow to rule out significant ASD, though not a particularly large shunt fraction.  If no significant ASD, would favor trial of pulmonary vasodilator (start with Opsumit).   He can stop Lasix IV and transition to po.   Marca Ancona 05/25/2020 3:53 PM

## 2020-05-25 NOTE — Assessment & Plan Note (Signed)
--  stable, continue Norvasc, Coreg, Aldactone

## 2020-05-25 NOTE — Assessment & Plan Note (Signed)
--  management per dietician

## 2020-05-25 NOTE — Interval H&P Note (Signed)
History and Physical Interval Note:  05/25/2020 1:05 PM  Benjamin Rosario  has presented today for surgery, with the diagnosis of pulmonary hypertension.  The various methods of treatment have been discussed with the patient and family. After consideration of risks, benefits and other options for treatment, the patient has consented to  Procedure(s): RIGHT HEART CATH (N/A) as a surgical intervention.  The patient's history has been reviewed, patient examined, no change in status, stable for surgery.  I have reviewed the patient's chart and labs.  Questions were answered to the patient's satisfaction.     Atsushi Yom [employer]

## 2020-05-25 NOTE — Assessment & Plan Note (Signed)
--  TSH was slightly elevated but no change to Synthroid now --continue Synthroid

## 2020-05-25 NOTE — TOC Progression Note (Signed)
Transition of Care [facility]) - Progression Note    Patient Details  Name: Benjamin Rosario MRN: [id number] Date of Birth: [birth year]  [facility] of [facility] CM/SW Contact  Leone Haven, RN Phone Number: 05/25/2020, 5:11 PM  Clinical Narrative:    NCM spoke with patient, asked if he would like a HHRN for CHF management , he states no he does not need one.         Expected Discharge Plan and Services                                                 Social Determinants of Health (SDOH) Interventions    Readmission Risk Interventions No flowsheet data found.

## 2020-05-25 NOTE — Progress Notes (Signed)
Patient places self on CPAP.  RT connected 2L O2 to CPAP and added sterile water to water chamber.  RT will continue to monitor.

## 2020-05-25 NOTE — Progress Notes (Signed)
PROGRESS NOTE  Benjamin Rosario RDE:[id number] DOB: Sep 29, 2000 DOA: 05/22/2020 PCP: Soundra Pilon, FNP  Brief History   19 year old man PMH morbid obesity, RV dysfunction presented with increasing shortness of breath.  Admitted for acute respiratory failure and acute right heart failure.   A & P  Acute hypoxemic respiratory failure (HCC) --resolved  Acute right-sided CHF (congestive heart failure) (HCC) --continue management per cardiology --s/p RHC today --plan for TEE tomorrow to exclude ASD  Demand ischemia (HCC) -secondary to acute CHF --no treatment indicated  Essential hypertension --stable, continue Norvasc, Coreg, Aldactone  Hypothyroidism --TSH was slightly elevated but no change to Synthroid now --continue Synthroid  Type 2 diabetes mellitus (HCC) --continue SSI  Morbid obesity with BMI of 50.0-59.9, adult (HCC) --management per dietician  OSA (obstructive sleep apnea) --CPAP QHS   Disposition Plan:  Discussion: plan as per cardiology  Status is: Inpatient  Remains inpatient appropriate because:Inpatient level of care appropriate due to severity of illness   Dispo: The patient is from: Home              Anticipated d/c is to: Home              Anticipated d/c date is: 1 day              Patient currently is not medically stable to d/c.  DVT prophylaxis: plan as per cardiology  Place and maintain sequential compression device Start: 05/24/20 1154 Code Status: Full Family Communication: none  Brendia Sacks, MD  [facility] Direct contact: see [email or web address] (further directions at bottom of note if needed) 7PM-7AM contact night coverage as at bottom of note 05/25/2020, 6:57 PM  LOS: 2 days   Significant Hospital Events   .    Consults:  .    Procedures:  .   Significant Diagnostic Tests:  Marland Kitchen    Micro Data:  .    Antimicrobials:  .   Interval History/Subjective  Feels fine F/u right heart failure  Objective   Vitals:    Vitals:   05/25/20 1401 05/25/20 1631  BP: (!) 142/66   Pulse: 88   Resp: 20 20  Temp: 98.6 F (37 C) 97.8 F (36.6 C)  SpO2: 92%     Exam:  Constitutional:   . Appears calm and comfortable ENMT:  . grossly normal hearing  Respiratory:  . CTA bilaterally, no w/r/r.  . Respiratory effort normal.  Cardiovascular:  . RRR, no m/r/g Psychiatric:  . Mental status o Mood, affect appropriate  I have personally reviewed the following:   Today's Data  . BMP unremarkable . CBC unremarkable  Scheduled Meds: . amLODipine  10 mg Oral Daily  . aspirin EC  81 mg Oral Daily  . carvedilol  6.25 mg Oral BID WC  . enoxaparin (LOVENOX) injection  100 mg Subcutaneous Q24H  . [START ON 05/26/2020] furosemide  40 mg Oral Daily  . insulin aspart  0-9 Units Subcutaneous TID WC  . levothyroxine  75 mcg Oral QAC breakfast  . mometasone-formoterol  2 puff Inhalation BID  . sodium chloride flush  3 mL Intravenous Q12H  . spironolactone  12.5 mg Oral Daily   Continuous Infusions: . sodium chloride 1,000 mL (05/23/20 0926)  . sodium chloride      Principal Problem:   Acute right-sided CHF (congestive heart failure) (HCC) Active Problems:   RVF (right ventricular failure) (HCC)   Acute hypoxemic respiratory failure (HCC)   Pulmonary hypertension, unspecified (HCC)   Type 2  diabetes mellitus (HCC)   Hypothyroidism   Morbid obesity with BMI of 50.0-59.9, adult (HCC)   Essential hypertension   ADHD (attention deficit hyperactivity disorder), combined type   Demand ischemia (HCC)   OSA (obstructive sleep apnea)   LOS: 2 days   How to contact the [facility] Attending or Consulting provider 7A - 7P or covering provider during after hours 7P -7A, for this patient?  1. Check the care team in [facility] and look for a) attending/consulting TR[facility] provider listed and b) the [facility] team listed 2. Log into [email or web address] and use [facility]'s universal password to access. If you do not have the password, please  contact the hospital operator. 3. Locate the [facility] provider you are looking for under [facility] and page to a number that you can be directly reached. 4. If you still have difficulty reaching the provider, please page the [facility] (Director on Call) for the Hospitalists listed on amion for assistance.

## 2020-05-25 NOTE — Progress Notes (Signed)
Subjective:  Breathing better no chest pain   Objective:  Vitals:   05/24/20 2105 05/25/20 0624 05/25/20 0841 05/25/20 0848  BP: (!) 103/51   139/87  Pulse: 82     Resp: 20     Temp: (!) 97.4 F (36.3 C)     TempSrc: Oral     SpO2: 97%  94%   Weight:  (!) 204.3 kg    Height:        Intake/Output from previous day:  Intake/Output Summary (Last 24 hours) at 05/25/2020 1004 Last data filed at 05/25/2020 0800 Gross per 24 hour  Intake 705 ml  Output 3150 ml  Net -2445 ml    Physical Exam: Affect appropriate Obese black male  HEENT: normal Neck supple with no adenopathy JVP normal no bruits no thyromegaly Lungs clear with no wheezing and good diaphragmatic motion Heart:  S1/S2 no murmur, no rub, gallop or click PMI normal Abdomen: benighn, BS positve, no tenderness, no AAA no bruit.  No HSM or HJR Distal pulses intact with no bruits Plus on bilateral edema Neuro non-focal Skin warm and dry No muscular weakness   Lab Results: Basic Metabolic Panel: Recent Labs    05/23/20 0032 05/23/20 0607 05/24/20 0639 05/25/20 0705  NA   < >  --  137 140  K   < >  --  3.7 4.0  CL   < >  --  98 101  CO2   < >  --  28 25  GLUCOSE   < >  --  78 86  BUN   < >  --  12 13  CREATININE   < > 1.15 1.04 1.00  CALCIUM   < >  --  8.9 9.4  MG  --  1.9 2.0  --    < > = values in this interval not displayed.   Liver Function Tests: No results for input(s): AST, ALT, ALKPHOS, BILITOT, PROT, ALBUMIN in the last 72 hours. No results for input(s): LIPASE, AMYLASE in the last 72 hours. CBC: Recent Labs    05/24/20 0639 05/25/20 0705  WBC 10.8* 9.9  HGB 13.8 15.1  HCT 45.9 50.3  MCV 80.4 81.1  PLT 378 390   Cardiac Enzymes: No results for input(s): CKTOTAL, CKMB, CKMBINDEX, TROPONINI in the last 72 hours. BNP: Invalid input(s): POCBNP D-Dimer: Recent Labs    05/23/20 0125  DDIMER 0.63*   Hemoglobin A1C: Recent Labs    05/24/20 0639  HGBA1C 6.5*   Fasting Lipid  Panel: No results for input(s): CHOL, HDL, LDLCALC, TRIG, CHOLHDL, LDLDIRECT in the last 72 hours. Thyroid Function Tests: Recent Labs    05/23/20 0607  TSH 6.291*   Anemia Panel: No results for input(s): VITAMINB12, FOLATE, FERRITIN, TIBC, IRON, RETICCTPCT in the last 72 hours.  Imaging: DG Chest 2 View  Result Date: 05/24/2020 CLINICAL DATA:  Shortness of breath, dry cough with hypertension and diabetes EXAM: CHEST - 2 VIEW COMPARISON:  05/23/2020 FINDINGS: Image slightly rotated the RIGHT. Cardiomediastinal contours are stable with mild fullness of the LEFT suggesting central vascular congestion. Improved aeration in the LEFT chest with interval improvement with respect to interstitial and airspace opacities in this area compared to the previous study. No lobar level consolidation. No effusion. No acute skeletal process to the extent evaluated. IMPRESSION: Mild central vascular congestion on the LEFT in this patient with unilateral absence of RIGHT pulmonary arteries. Findings may reflect improving edema or infection. Electronically Signed  By: Donzetta Kohut M.D.   On: 05/24/2020 08:59    Cardiac Studies:  ECG: NSR normal    Telemetry:  SR no arrhythmia   Echo: 4/21 moderate RVE/hypokinesis normal valves no PS normal LVEF  Medications:   . amLODipine  10 mg Oral Daily  . aspirin EC  81 mg Oral Daily  . carvedilol  6.25 mg Oral BID WC  . enoxaparin (LOVENOX) injection  100 mg Subcutaneous Q24H  . furosemide  60 mg Intravenous Q12H  . insulin aspart  0-9 Units Subcutaneous TID WC  . levothyroxine  75 mcg Oral QAC breakfast  . mometasone-formoterol  2 puff Inhalation BID  . nitroGLYCERIN  0.4 mg Transdermal Daily  . sodium chloride flush  3 mL Intravenous Q12H  . spironolactone  12.5 mg Oral Daily     . sodium chloride 1,000 mL (05/23/20 0926)  . sodium chloride    . 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 Diuresed well. SSCP not cardiac chronic mild elevation in troponin  No acute ECG changes CT with normal coronary origins and no calcium. Plan for right heart cath today to see if PCWP low and PVR high ? Of label use of vasodilator Rx  Has right antecubital iv in to exchange out for access already  Being seen by CHF team Antibiotics d/c He is not likely to do well long term from a pulmonary perspective as his left lung has a lot of parenchymal disease will need close outpatient pulmonary f/u ? D/c today post right heart cath if he is not going to start selective pulmonary vasodilator.per Dr Shirlee Latch. Change to PO lasix D/C transdermal nitrates   Charlton Haws 05/25/2020, 10:04 AM

## 2020-05-26 ENCOUNTER — Inpatient Hospital Stay (HOSPITAL_COMMUNITY)
Admit: 2020-05-26 | Discharge: 2020-05-26 | Disposition: A | Discharge status: Home / Self Care | Payer: No Typology Code available for payment source | Attending: Adult Health | Admitting: Adult Health

## 2020-05-26 ENCOUNTER — Inpatient Hospital Stay (HOSPITAL_COMMUNITY): Payer: No Typology Code available for payment source | Admitting: Certified Registered Nurse Anesthetist

## 2020-05-26 ENCOUNTER — Telehealth (HOSPITAL_COMMUNITY): Payer: Self-pay | Admitting: Pharmacist

## 2020-05-26 ENCOUNTER — Encounter (HOSPITAL_COMMUNITY): Payer: Self-pay | Admitting: Internal Medicine

## 2020-05-26 ENCOUNTER — Encounter (HOSPITAL_COMMUNITY)
Admission: EM | Disposition: A | Discharge status: Home / Self Care | Payer: Self-pay | Source: Home / Self Care | Attending: Internal Medicine

## 2020-05-26 DIAGNOSIS — Q211 Atrial septal defect: Secondary | ICD-10-CM

## 2020-05-26 DIAGNOSIS — G4733 Obstructive sleep apnea (adult) (pediatric): Secondary | ICD-10-CM

## 2020-05-26 HISTORY — PX: BUBBLE STUDY: SHX6837

## 2020-05-26 HISTORY — PX: TEE WITHOUT CARDIOVERSION: SHX5443

## 2020-05-26 LAB — BASIC METABOLIC PANEL
Anion gap: 11 (ref 5–15)
BUN: 11 mg/dL (ref 6–20)
CO2: 26 mmol/L (ref 22–32)
Calcium: 9.5 mg/dL (ref 8.9–10.3)
Chloride: 102 mmol/L (ref 98–111)
Creatinine, Ser: 1.05 mg/dL (ref 0.61–1.24)
GFR calc Af Amer: >60 mL/min (ref >60)
GFR calc non Af Amer: >60 mL/min (ref >60)
Glucose, Bld: 83 mg/dL (ref 70–99)
Potassium: 4 mmol/L (ref 3.5–5.1)
Sodium: 139 mmol/L (ref 135–145)

## 2020-05-26 LAB — GLUCOSE, CAPILLARY
Glucose-Capillary: 83 mg/dL (ref 70–99)
Glucose-Capillary: 93 mg/dL (ref 70–99)

## 2020-05-26 SURGERY — ECHOCARDIOGRAM, TRANSESOPHAGEAL
Anesthesia: Monitor Anesthesia Care

## 2020-05-26 MED ORDER — MIDAZOLAM HCL 5 MG/5ML IJ SOLN
INTRAMUSCULAR | Status: DC | PRN
  Administered 2020-05-26: 2 mg via INTRAVENOUS

## 2020-05-26 MED ORDER — PROPOFOL 500 MG/50ML IV EMUL
INTRAVENOUS | Status: DC | PRN
  Administered 2020-05-26: 100 ug/kg/min via INTRAVENOUS

## 2020-05-26 MED ORDER — FUROSEMIDE 40 MG PO TABS: 40.0000 mg | ORAL_TABLET | Freq: Every day | ORAL | 1 refills | Status: AC

## 2020-05-26 MED ORDER — PROPOFOL 10 MG/ML IV BOLUS
INTRAVENOUS | Status: DC | PRN
  Administered 2020-05-26: 30 mg via INTRAVENOUS

## 2020-05-26 MED ORDER — LIDOCAINE 2% (20 MG/ML) 5 ML SYRINGE
INTRAMUSCULAR | Status: DC | PRN
  Administered 2020-05-26: 50 mg via INTRAVENOUS

## 2020-05-26 MED ORDER — SPIRONOLACTONE 25 MG PO TABS: 12.5000 mg | ORAL_TABLET | Freq: Every day | ORAL | 0 refills | Status: AC

## 2020-05-26 MED ORDER — LISINOPRIL 5 MG PO TABS
5.0000 mg | ORAL_TABLET | Freq: Every day | ORAL | Status: DC
  Administered 2020-05-26: 5 mg via ORAL
  Filled 2020-05-26: qty 1

## 2020-05-26 NOTE — Telephone Encounter (Signed)
Received message that patient will be started on [employer]. Will fax in [employer] and Patient Enrollment form once completed.  Karle Plumber, PharmD, BCPS, BCCP, CPP Heart Failure Clinic Pharmacist [phone]

## 2020-05-26 NOTE — Anesthesia Postprocedure Evaluation (Signed)
Anesthesia Post Note  Patient: Benjamin Rosario  Procedure(s) Performed: TRANSESOPHAGEAL ECHOCARDIOGRAM (TEE) (N/A ) BUBBLE STUDY     Patient location during evaluation: Endoscopy Anesthesia Type: MAC Level of consciousness: awake and alert, patient cooperative and oriented Pain management: pain level controlled Vital Signs Assessment: post-procedure vital signs reviewed and stable Respiratory status: spontaneous breathing, nonlabored ventilation and respiratory function stable Cardiovascular status: blood pressure returned to baseline and stable Postop Assessment: no apparent nausea or vomiting Anesthetic complications: no   No complications documented.  Last Vitals:  Vitals:   05/26/20 1208 05/26/20 1218  BP: (!) 109/37 (!) 142/63  Pulse: 83 81  Resp: 17 (!) 21  Temp: 36.6 C   SpO2: 99% 96%    Last Pain:  Vitals:   05/26/20 1208  TempSrc: Oral  PainSc: 0-No pain                 Nandika Stetzer,E. Emilio Baylock

## 2020-05-26 NOTE — Anesthesia Preprocedure Evaluation (Addendum)
Anesthesia Evaluation  Patient identified by MRN, date of birth, ID band Patient awake    Reviewed: Allergy & Precautions, NPO status , Patient's Chart, lab work & pertinent test results, reviewed documented beta blocker date and time   History of Anesthesia Complications Negative for: history of anesthetic complications  Airway Mallampati: I  TM Distance: >3 FB Neck ROM: Full    Dental  (+) Teeth Intact, Dental Advisory Given   Pulmonary sleep apnea and Continuous Positive Airway Pressure Ventilation , COPD,  COPD inhaler, former smoker,  05/22/2020 SARS coronavirus NEG   breath sounds clear to auscultation       Cardiovascular hypertension, Pt. on medications and Pt. 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  2. Moderate pulmonary arterial hypertension.  3. Preserved cardiac output.  4. There is a step-up in oxygen saturation between SVC and RA (64% =>  72%), shunt fraction 1.3. Consider ASD.   12/2019 ECHO: Technically difficult; vigorous LV systolic function; mild LVH; RV not well visualized but function appears to be reduced. EF >75%. Left ventricular diastolic parameters were normal.  Right ventricular systolic function is moderately reduced. The right  ventricular size is normal.    Neuro/Psych PSYCHIATRIC DISORDERS (ADD) Depression negative neurological ROS     GI/Hepatic negative GI ROS, Neg liver ROS,   Endo/Other  diabetes (glu 83), Oral Hypoglycemic AgentsHypothyroidism Morbid obesity  Renal/GU negative Renal ROS     Musculoskeletal   Abdominal (+) + obese,   Peds  Hematology negative hematology ROS (+)   Anesthesia Other Findings   Reproductive/Obstetrics                            Anesthesia Physical Anesthesia Plan  ASA: III  Anesthesia Plan: MAC    Post-op Pain Management:    Induction:   PONV Risk Score and Plan: 1 and Treatment may vary due to age or medical condition  Airway Management Planned: Natural Airway and Nasal Cannula  Additional Equipment: None  Intra-op Plan:   Post-operative Plan:   Informed Consent: I have reviewed the patients History and Physical, chart, labs and discussed the procedure including the risks, benefits and alternatives for the proposed anesthesia with the patient or authorized representative who has indicated his/her understanding and acceptance.     Dental advisory given  Plan Discussed with: CRNA and Surgeon  Anesthesia Plan Comments:        Anesthesia Quick Evaluation

## 2020-05-26 NOTE — Discharge Summary (Signed)
Physician Discharge Summary  Benjamin Rosario DVV:[id number] DOB: 08-17-2001 DOA: 05/22/2020  PCP: Soundra Pilon, FNP  Admit date: 05/22/2020 Discharge date: 05/26/2020  Recommendations for Outpatient Follow-up:  1. Heart failure, see below 2. DM type 2 well controlled in hospital w/o Rx. Metformin stopped given heart failure; consider alternative agent but control w/o agent adequate for present. 3. OSA, needs to use CPAP consistently and wear supplemental oxygen consistently   Follow-up Information    [facility] Follow up on 06/07/2020.   Specialty: Cardiology Why: at 2:30 Garage Code [id number] Contact information: [address] [id number] [place] [address] [phone]       [name], MD. Schedule an appointment as soon as possible for a visit in 2 week(s).   Specialty: Pulmonary Disease Contact information: [address] [place] [address] [phone]        Soundra Pilon, FNP.   Specialty: Family Medicine Why: Please call for an appointment Contact information: [address] [place] [address] [phone]                Discharge Diagnoses: Principal diagnosis is #1 Principal Problem:   Acute right-sided CHF (congestive heart failure) (HCC) Active Problems:   RVF (right ventricular failure) (HCC)   Acute hypoxemic respiratory failure (HCC)   Pulmonary hypertension, unspecified (HCC)   Type 2 diabetes mellitus (HCC)   Hypothyroidism   Morbid obesity with BMI of 50.0-59.9, adult (HCC)   Essential hypertension   ADHD (attention deficit hyperactivity disorder), combined type   Demand ischemia (HCC)   OSA (obstructive sleep apnea) Congenital absence right pulm artery  Discharge Condition: improved Disposition: home  Diet recommendation: heart healthy, diabetic diet  Filed Weights   05/25/20 0624 05/26/20 0416 05/26/20 1047  Weight: (!) 204.3 kg (!) 203.8  kg (!) 203.8 kg    History of present illness:  19 year old man PMH morbid obesity, RV dysfunction presented with increasing shortness of breath.  Admitted for acute respiratory failure and acute right heart failure.   Hospital Course:  Seen by cardiology, advanced heart failure and pulmonology. Congential heart disease w/ absence of right main pulm artery leading to chronic pulm issues, hypoxia, complicated by COPD from smoking and OSA from morbid obesity. Diuresed well. Underwent RHC >  normal filling pressures after diuresis.  Moderate pulmonary arterial hypertension.  Preserved cardiac output. There is a step-up in oxygen saturation between SVC and RA (64% =>72%), shunt fraction 1.3. Consider ASD. Underwent TEE > Limited study due to respiratory distress, but no ASD or PFO was seen. cleared by cardiology for discharge  Acute hypoxemic respiratory failure (HCC) --resolved --multifactorial emphysema/OSA/PNA/RV failure   Acute right-sided CHF (congestive heart failure) (HCC) --continue management per cardiology --s/p RHC and TEE --home on Lasix 40mg  daily, linisopril 5, spironolactone 12.5 daily, amlodipine 10mg  daily, Coreg 6.25mg  BID --cardiology will trial Opsumit as outpt --f/u with AHF as outpt  Congential absence right pulmonary artery  Demand ischemia (HCC) -secondary to acute CHF --no treatment indicated  Essential hypertension --stable, continue Norvasc, Coreg, Aldactone  Hypothyroidism --TSH was slightly elevated but no change to Synthroid now --continue Synthroid  Type 2 diabetes mellitus (HCC) --continue SSI  Morbid obesity with BMI of 50.0-59.9, adult (HCC) --management per dietician  OSA (obstructive sleep apnea) --CPAP QHS, followup with Dr. Chronic hypoxic respiratory failure on 2-4L [place]   Emphysema, PMH smoker, stopped  Consults:  . Cardiology . Advanced heart failure  Procedures:  . Right heart cath as below . TEE in Epic  Today's  assessment: S: feels well, no complaints O: Vitals:  Vitals:   05/26/20 1242 05/26/20 1244  BP: (!) 141/81 136/79  Pulse: 78 76  Resp:    Temp: 98.3 F (36.8 C) 98.3 F (36.8 C)  SpO2: 95% 95%    Constitutional:  . Appears calm and comfortable Respiratory:  . CTA bilaterally, no w/r/r.  . Respiratory effort normal.  Cardiovascular:  . RRR, no m/r/g Psychiatric:  . Mental status o Mood, affect appropriate  BMP noted  Discharge Instructions  Discharge Instructions    (HEART FAILURE PATIENTS) Call MD:  Anytime you have any of the following symptoms: 1) 3 pound weight gain in 24 hours or 5 pounds in 1 week 2) shortness of breath, with or without a dry hacking cough 3) swelling in the hands, feet or stomach 4) if you have to sleep on extra pillows at night in order to breathe.   Complete by: As directed    Diet - low sodium heart healthy   Complete by: As directed    Diet Carb Modified   Complete by: As directed    Discharge instructions   Complete by: As directed    Call your physician or seek immediate medical attention for pain, swelling, shortness of breath or worsening of condition.   Increase activity slowly   Complete by: As directed      Allergies as of 05/26/2020   No Known Allergies     Medication List    STOP taking these medications   hydrochlorothiazide 12.5 MG capsule Commonly known as: MICROZIDE   ibuprofen 200 MG tablet Commonly known as: ADVIL   metFORMIN 500 MG tablet Commonly known as: Glucophage     TAKE these medications   albuterol 108 (90 Base) MCG/ACT inhaler Commonly known as: VENTOLIN HFA Inhale 2 puffs into the lungs every 6 (six) hours as needed for wheezing or shortness of breath.   amLODipine 10 MG tablet Commonly known as: NORVASC Take 1 tablet (10 mg total) by mouth daily.   carvedilol 6.25 MG tablet Commonly known as: COREG Take 1 tablet (6.25 mg total) by mouth 2 (two) times daily with a meal.   furosemide 40 MG  tablet Commonly known as: LASIX Take 1 tablet (40 mg total) by mouth daily. Start taking on: May 27, 2020   levothyroxine 75 MCG tablet Commonly known as: SYNTHROID Take 1 tablet (75 mcg total) by mouth daily before breakfast.   lisinopril 5 MG tablet Commonly known as: ZESTRIL Take 1 tablet (5 mg total) by mouth daily.   mometasone-formoterol 100-5 MCG/ACT Aero Commonly known as: DULERA Inhale 2 puffs into the lungs 2 (two) times daily. What changed:   when to take this  reasons to take this   spironolactone 25 MG tablet Commonly known as: ALDACTONE Take 0.5 tablets (12.5 mg total) by mouth daily. Start taking on: May 27, 2020      No Known Allergies  The results of significant diagnostics from this hospitalization (including imaging, microbiology, ancillary and laboratory) are listed below for reference.    Significant Diagnostic Studies: DG Chest 2 View  Result Date: 05/24/2020 CLINICAL DATA:  Shortness of breath, dry cough with hypertension and diabetes EXAM: CHEST - 2 VIEW COMPARISON:  05/23/2020 FINDINGS: Image slightly rotated the RIGHT. Cardiomediastinal contours are stable with mild fullness of the LEFT suggesting central vascular congestion. Improved aeration in the LEFT chest with interval improvement  with respect to interstitial and airspace opacities in this area compared to the previous study. No lobar level consolidation. No effusion. No acute skeletal process to the extent evaluated. IMPRESSION: Mild central vascular congestion on the LEFT in this patient with unilateral absence of RIGHT pulmonary arteries. Findings may reflect improving edema or infection. Electronically Signed   By: Donzetta KohutGeoffrey  Wile M.D.   On: 05/24/2020 08:59   CT Angio Chest PE W and/or Wo Contrast  Result Date: 05/23/2020 CLINICAL DATA:  Shortness of breath and positive D-dimer. EXAM: CT ANGIOGRAPHY CHEST WITH CONTRAST TECHNIQUE: Multidetector CT imaging of the chest was  performed using the standard protocol during bolus administration of intravenous contrast. Multiplanar CT image reconstructions and MIPs were obtained to evaluate the vascular anatomy. CONTRAST:  100mL OMNIPAQUE IOHEXOL 350 MG/ML SOLN COMPARISON:  12/10/2019 FINDINGS: Cardiovascular: Normal heart size. No pericardial effusion. Suboptimal pulmonary artery opacification due to primarily to streak artifact and body habitus. No pulmonary filling defects are seen. 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 Mediastinum/Nodes: Negative for adenopathy or mass. Lungs/Pleura: Hypoplastic right lung with emphysematous spaces. Ground-glass opacity in the left upper and lower lobes with areas of air trapping causing mosaic attenuation. No effusion or pneumothorax. Upper Abdomen: Negative Musculoskeletal: No acute or aggressive finding. Review of the MIP images confirms the above findings. IMPRESSION: 1. Patchy airspace disease in the left lung which could reflect infection or unilateral edema in the setting of absent right pulmonary arteries and veins. 2. Dysplastic right lung. 3. No evidence of pulmonary embolism. Electronically Signed   By: Marnee SpringJonathon  Watts M.D.   On: 05/23/2020 04:50   CARDIAC CATHETERIZATION  Result Date: 05/25/2020 1. Normal filling pressures after diuresis. 2. Moderate pulmonary arterial hypertension. 3. Preserved cardiac output. 4. There is a step-up in oxygen saturation between SVC and RA (64% => 72%), shunt fraction 1.3.  Consider ASD.   DG Chest Port 1 View  Result Date: 05/23/2020 CLINICAL DATA:  Left-sided chest pain EXAM: PORTABLE CHEST 1 VIEW COMPARISON:  12/13/2019 FINDINGS: Right lung is grossly clear. Left lower lung airspace disease concerning for pneumonia. Mild cardiomegaly. No pneumothorax. IMPRESSION: Left lower lung airspace disease concerning for pneumonia. Electronically Signed   By: Jasmine PangKim  Fujinaga M.D.   On:  05/23/2020 00:51    Microbiology: Recent Results (from the past 240 hour(s))  SARS Coronavirus 2 by RT PCR (hospital order, [facility]) Nasopharyngeal Nasopharyngeal Swab     Status: None   Collection Time: 05/22/20 11:45 PM   Specimen: Nasopharyngeal Swab  Result Value Ref Range Status   SARS Coronavirus 2 NEGATIVE NEGATIVE Final    Comment: (NOTE) SARS-CoV-2 target nucleic acids are NOT DETECTED.  The SARS-CoV-2 RNA is generally detectable in upper and lower respiratory specimens during the acute phase of infection. The lowest concentration of SARS-CoV-2 viral copies this assay can detect is 250 copies / mL. A negative result does not preclude SARS-CoV-2 infection and should not be used as the sole basis for treatment or other patient management decisions.  A negative result may occur with improper specimen collection / handling, submission of specimen other than nasopharyngeal swab, presence of viral mutation(s) within the areas targeted by this assay, and inadequate number of viral copies (<250 copies / mL). A negative result must be combined with clinical observations, patient history, and epidemiological information.  Fact Sheet for Patients:   BoilerBrush.com.cyhttps://www.fda.gov/media/136312/download  Fact Sheet for Healthcare Providers: https://pope.com/https://www.fda.gov/media/136313/download  This  test is not yet approved or  cleared by the [place] and has been authorized for detection and/or diagnosis of SARS-CoV-2 by FDA under an Emergency Use Authorization (EUA).  This EUA will remain in effect (meaning this test can be used) for the duration of the COVID-19 declaration under Section 564(b)(1) of the Act, 21 U.S.C. section 360bbb-3(b)(1), unless the authorization is terminated or revoked sooner.  Performed at [facility], [address], [place], [place] [address]      Labs: Basic Metabolic Panel: Recent Labs  Lab 05/23/20 0032 05/23/20 0032  05/23/20 1914 05/24/20 7829 05/24/20 0639 05/25/20 0705 05/25/20 1329 05/25/20 1333 05/25/20 1338 05/25/20 1339 05/25/20 1341 05/26/20 0844  NA 140   < >  --  137   < > 140   < > 142  142 142 141 141 139  K 4.2   < >  --  3.7   < > 4.0   < > 4.0  4.0 3.8 3.9 3.8 4.0  CL 104  --   --  98  --  101  --   --   --   --   --  102  CO2 26  --   --  28  --  25  --   --   --   --   --  26  GLUCOSE 105*  --   --  78  --  86  --   --   --   --   --  83  BUN 9  --   --  12  --  13  --   --   --   --   --  11  CREATININE 1.19  --  1.15 1.04  --  1.00  --   --   --   --   --  1.05  CALCIUM 9.1  --   --  8.9  --  9.4  --   --   --   --   --  9.5  MG  --   --  1.9 2.0  --   --   --   --   --   --   --   --    < > = values in this interval not displayed.   CBC: Recent Labs  Lab 05/23/20 0032 05/23/20 0032 05/23/20 5621 05/23/20 0607 05/24/20 0639 05/24/20 0639 05/25/20 0705 05/25/20 0705 05/25/20 1329 05/25/20 1333 05/25/20 1338 05/25/20 1339 05/25/20 1341  WBC 13.6*  --  11.4*  --  10.8*  --  9.9  --   --   --   --   --   --   HGB 14.4   < > 13.6   < > 13.8   < > 15.1   < > 16.0 15.6  16.0 15.3 15.6 15.6  HCT 47.4   < > 45.3   < > 45.9   < > 50.3   < > 47.0 46.0  47.0 45.0 46.0 46.0  MCV 81.6  --  82.4  --  80.4  --  81.1  --   --   --   --   --   --   PLT 401*  --  378  --  378  --  390  --   --   --   --   --   --    < > = values in this interval not displayed.  Recent Labs    06/08/19 1350 12/11/19 0349 05/23/20 0032  BNP 87.4 108.2* 279.2*   CBG: Recent Labs  Lab 05/25/20 1107 05/25/20 1628 05/25/20 2028 05/26/20 0733 05/26/20 1246  GLUCAP 86 91 87 83 93    Principal Problem:   Acute right-sided CHF (congestive heart failure) (HCC) Active Problems:   RVF (right ventricular failure) (HCC)   Acute hypoxemic respiratory failure (HCC)   Pulmonary hypertension, unspecified (HCC)   Type 2 diabetes mellitus (HCC)   Hypothyroidism   Morbid obesity with BMI of  50.0-59.9, adult (HCC)   Essential hypertension   ADHD (attention deficit hyperactivity disorder), combined type   Demand ischemia (HCC)   OSA (obstructive sleep apnea)   Time coordinating discharge: 45 minutes  Signed:  Brendia Sacks, MD  [facility]  05/26/2020, 8:07 PM

## 2020-05-26 NOTE — Progress Notes (Signed)
D/C instructions given and reviewed. No questions asked at this time. Tele and IV removed. Tolerated well.

## 2020-05-26 NOTE — Plan of Care (Signed)
Nutrition Education Note  RD consulted for nutrition education regarding obesity. RD focused education on CHF and diabetes, as this was the reason why pt was admitted to the hospital.  Lab Results  Component Value Date   HGBA1C 6.5 (H) 05/24/2020  PTA DM medications 500 mg metformin BID.   9/15- s/p RHC 9/16- s/p TEE- limited study due to respiratory distress, but no ASD or PFO was seen  Spoke with pt at bedside, who reports feeling better today. He has a good appetite, consumed 100% of lunch. Pt shares that he typically consumes 2-3 meals per day, however, sometimes skips meals due to schedule changes (pt just started a job on second shift). He has been trying to cook more at home to decrease dependence on take out food. He shares pt and his girlfriend have been cooking at home and using the air fryer more. Discussed lower sodium seasonings pt could use when cooking. Focus of education was on self-management of chronic illnesses. Pt reports good support system of girlfriend and his mom, who is a Engineer, civil (consulting).   RD provided "Heart Healthy, Consistent Carbohydrate Nutrition Therapy" handout from the Academy of Nutrition and Dietetics. Reviewed patient's dietary recall. Provided examples on ways to decrease sodium intake in diet. Discouraged intake of processed foods and use of salt shaker. Encouraged fresh fruits and vegetables as well as whole grain sources of carbohydrates to maximize fiber intake.   RD discussed why it is important for patient to adhere to diet recommendations, and emphasized the role of fluids, foods to avoid, and importance of weighing self daily.   Discussed different food groups and their effects on blood sugar, emphasizing carbohydrate-containing foods. Provided list of carbohydrates and recommended serving sizes of common foods.  Discussed importance of controlled and consistent carbohydrate intake throughout the day. Provided examples of ways to balance meals/snacks and  encouraged intake of high-fiber, whole grain complex carbohydrates. Teach back method used.  Expect fair to good compliance.  Body mass index is 59.26 kg/m. Pt meets criteria for extreme obesity, class III based on current BMI. Obesity is a complex, chronic medical condition that is optimally managed by a multidisciplinary care team. Weight loss is not an ideal goal for an acute inpatient hospitalization. However, if further work-up for obesity is warranted, consider outpatient referral to outpatient bariatric service and/or [facility] Diabetes [facility].    Current diet order is heart healthy/ carb modified, patient is consuming approximately 100% of meals at this time. Labs and medications reviewed. No further nutrition interventions warranted at this time. RD contact information provided. If additional nutrition issues arise, please re-consult RD.   Levada Schilling, RD, LDN, CDCES Registered Dietitian II Certified Diabetes Care and Education Specialist Please refer to [facility] for RD and/or RD on-call/weekend/after hours pager

## 2020-05-26 NOTE — Progress Notes (Signed)
Patient advised that family brings oxygen from home for transportation, per patient he will be ok, he is not wearing oxygen during the day anyway. MD Irene Limbo informed and advised oxygen. Patient started its short distance from hospital to the home and he does not need it.

## 2020-05-26 NOTE — Progress Notes (Signed)
Pateint back from TEE, no diet order. Checked with MD Irene Limbo and Tonye Becket, NP. Per Amy ok to put patient back on the previous diet.

## 2020-05-26 NOTE — Transfer of Care (Signed)
Immediate Anesthesia Transfer of Care Note  Patient: Benjamin Rosario  Procedure(s) Performed: TRANSESOPHAGEAL ECHOCARDIOGRAM (TEE) (N/A ) BUBBLE STUDY  Patient Location: Endoscopy Unit  Anesthesia Type:MAC  Level of Consciousness: awake, alert  and oriented  Airway & Oxygen Therapy: Patient Spontanous Breathing and Patient connected to nasal cannula oxygen  Post-op Assessment: Report given to RN, Post -op Vital signs reviewed and stable and Patient moving all extremities  Post vital signs: Reviewed and stable  Last Vitals:  Vitals Value Taken Time  BP 109/37 05/26/20 1208  Temp 36.6 C 05/26/20 1208  Pulse 80 05/26/20 1215  Resp 30 05/26/20 1215  SpO2 97 % 05/26/20 1215  Vitals shown include unvalidated device data.  Last Pain:  Vitals:   05/26/20 1208  TempSrc: Oral  PainSc: 0-No pain         Complications: No complications documented.

## 2020-05-26 NOTE — Telephone Encounter (Signed)
Advanced Heart Failure Patient Advocate Encounter  Prior Authorization for Opsumit has been approved.    PA# [id number] Effective dates: 05/26/20 through 05/26/2023  Karle Plumber, PharmD, BCPS, BCCP, CPP Heart Failure Clinic Pharmacist [phone]

## 2020-05-26 NOTE — Progress Notes (Signed)
Subjective:  Breathing better no chest pain   Objective:  Vitals:   05/25/20 2007 05/25/20 2021 05/26/20 0416 05/26/20 0418  BP:  113/81  (!) 151/91  Pulse:  83  88  Resp:  20  20  Temp:  98.7 F (37.1 C)  98.7 F (37.1 C)  TempSrc:  Oral  Oral  SpO2: 94% 97%  95%  Weight:   (!) 203.8 kg   Height:        Intake/Output from previous day:  Intake/Output Summary (Last 24 hours) at 05/26/2020 0836 Last data filed at 05/25/2020 2019 Gross per 24 hour  Intake 240 ml  Output 600 ml  Net -360 ml    Physical Exam: Affect appropriate Obese black male  HEENT: normal Neck supple with no adenopathy JVP normal no bruits no thyromegaly Lungs clear with no wheezing and good diaphragmatic motion Heart:  S1/S2 no murmur, no rub, gallop or click PMI normal Abdomen: benighn, BS positve, no tenderness, no AAA no bruit.  No HSM or HJR Distal pulses intact with no bruits Plus on bilateral edema Neuro non-focal Skin warm and dry No muscular weakness   Lab Results: Basic Metabolic Panel: Recent Labs    05/24/20 0639 05/24/20 0639 05/25/20 0705 05/25/20 1329 05/25/20 1339 05/25/20 1341  NA 137   < > 140   < > 141 141  K 3.7   < > 4.0   < > 3.9 3.8  CL 98  --  101  --   --   --   CO2 28  --  25  --   --   --   GLUCOSE 78  --  86  --   --   --   BUN 12  --  13  --   --   --   CREATININE 1.04  --  1.00  --   --   --   CALCIUM 8.9  --  9.4  --   --   --   MG 2.0  --   --   --   --   --    < > = values in this interval not displayed.   Liver Function Tests: No results for input(s): AST, ALT, ALKPHOS, BILITOT, PROT, ALBUMIN in the last 72 hours. No results for input(s): LIPASE, AMYLASE in the last 72 hours. CBC: Recent Labs    05/24/20 0639 05/24/20 0639 05/25/20 0705 05/25/20 1329 05/25/20 1339 05/25/20 1341  WBC 10.8*  --  9.9  --   --   --   HGB 13.8   < > 15.1   < > 15.6 15.6  HCT 45.9   < > 50.3   < > 46.0 46.0  MCV 80.4  --  81.1  --   --   --   PLT 378   --  390  --   --   --    < > = values in this interval not displayed.   Cardiac Enzymes: No results for input(s): CKTOTAL, CKMB, CKMBINDEX, TROPONINI in the last 72 hours. BNP: Invalid input(s): POCBNP D-Dimer: No results for input(s): DDIMER in the last 72 hours. Hemoglobin A1C: Recent Labs    05/24/20 0639  HGBA1C 6.5*   Fasting Lipid Panel: No results for input(s): CHOL, HDL, LDLCALC, TRIG, CHOLHDL, LDLDIRECT in the last 72 hours. Thyroid Function Tests: No results for input(s): TSH, T4TOTAL, T3FREE, THYROIDAB in the last 72 hours.  Invalid input(s): FREET3 Anemia Panel: No results  for input(s): VITAMINB12, FOLATE, FERRITIN, TIBC, IRON, RETICCTPCT in the last 72 hours.  Imaging: DG Chest 2 View  Result Date: 05/24/2020 CLINICAL DATA:  Shortness of breath, dry cough with hypertension and diabetes EXAM: CHEST - 2 VIEW COMPARISON:  05/23/2020 FINDINGS: Image slightly rotated the RIGHT. Cardiomediastinal contours are stable with mild fullness of the LEFT suggesting central vascular congestion. Improved aeration in the LEFT chest with interval improvement with respect to interstitial and airspace opacities in this area compared to the previous study. No lobar level consolidation. No effusion. No acute skeletal process to the extent evaluated. IMPRESSION: Mild central vascular congestion on the LEFT in this patient with unilateral absence of RIGHT pulmonary arteries. Findings may reflect improving edema or infection. Electronically Signed   By: Donzetta Kohut M.D.   On: 05/24/2020 08:59   CARDIAC CATHETERIZATION  Result Date: 05/25/2020 1. Normal filling pressures after diuresis. 2. Moderate pulmonary arterial hypertension. 3. Preserved cardiac output. 4. There is a step-up in oxygen saturation between SVC and RA (64% => 72%), shunt fraction 1.3.  Consider ASD.    Cardiac Studies:  ECG: NSR normal    Telemetry:  SR no arrhythmia   Echo: 4/21 moderate RVE/hypokinesis normal valves  no PS normal LVEF  Medications:   . amLODipine  10 mg Oral Daily  . aspirin EC  81 mg Oral Daily  . carvedilol  6.25 mg Oral BID WC  . enoxaparin (LOVENOX) injection  100 mg Subcutaneous Q24H  . furosemide  40 mg Oral Daily  . insulin aspart  0-9 Units Subcutaneous TID WC  . levothyroxine  75 mcg Oral QAC breakfast  . mometasone-formoterol  2 puff Inhalation BID  . sodium chloride flush  3 mL Intravenous Q12H  . spironolactone  12.5 mg Oral Daily     . sodium chloride 1,000 mL (05/23/20 0926)  . sodium chloride 20 mL/hr at 05/25/20 2019    Assessment/Plan:   1. 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 Diuresed well. SSCP not cardiac chronic mild elevation in troponin  No acute ECG changes CT with normal coronary origins and no calcium. Right heart cath with PVR only 4.1 WU;s and PCWP 11 does not appear to need selective pulmonary vasodilator For TEE ? Step up in RA sats Review of non gated CT did not show any obvious ASD had vestigeal right sided PV;s and normal left sided veins Plan per CHF team and Dr Yates Decamp [facility] 05/26/2020, 8:35 AM

## 2020-05-26 NOTE — CV Procedure (Signed)
Procedure: TEE  Indication: Rule out ASD.  Sedation: Per anesthesiology.   Findings: Limited study.  Patient did not tolerate procedure well due to respiratory distress.  He recovered rapidly with removal of the TEE probe.    Normal LV size and systolic function, EF 55-60%.  Moderately dilated right ventricle with mildly decreased systolic function.  Mildly D-shaped interventricular septum consistent with RV pressure/volume overload.  Mild right atrial enlargement, normal left atrial size.  There was no evidence for ASD or PFO by color doppler.  Bubble study was negative. Mild TR, unable to obtain RV-RA gradient.  The left-sided valves were not interrogated.   Impression: Limited study due to respiratory distress, but no ASD or PFO was seen.   Marca Ancona 05/26/2020 12:00 PM

## 2020-05-26 NOTE — Progress Notes (Signed)
  Echocardiogram Echocardiogram Transesophageal has been performed.  Benjamin Rosario 05/26/2020, 12:35 PM

## 2020-05-26 NOTE — Interval H&P Note (Signed)
History and Physical Interval Note:  05/26/2020 11:47 AM  Benjamin Rosario  has presented today for surgery, with the diagnosis of PULMONARY HYPERTENSION.  The various methods of treatment have been discussed with the patient and family. After consideration of risks, benefits and other options for treatment, the patient has consented to  Procedure(s): TRANSESOPHAGEAL ECHOCARDIOGRAM (TEE) (N/A) as a surgical intervention.  The patient's history has been reviewed, patient examined, no change in status, stable for surgery.  I have reviewed the patient's chart and labs.  Questions were answered to the patient's satisfaction.     [name] [employer]

## 2020-05-26 NOTE — Progress Notes (Signed)
Patient ID: Benjamin Rosario, male   DOB: Sep 02, 2001, 19 y.o.   MRN: [id number]     Advanced Heart Failure Rounding Note  PCP-Cardiologist: No primary care provider on file.   Subjective:    Overall feeling better.  He is back on po Lasix.   RHC Procedural Findings: Hemodynamics (mmHg) RA mean 6 RV 42/2 PA 59/29, mean 40 PCWP mean 11 Oxygen saturations: SVC 64% RA 72% RV 72% PA 71% AO 100% Cardiac Output (Fick) 7.07  Cardiac Index (Fick) 2.31 PVR 4.1 WU  TEE: EF 55-60%, D-shaped septum suggestive of RV pressure/volume overload, moderately dilated RV with mildly decreased systolic function.  No ASD or PFO noted, bubble study negative.    Objective:   Weight Range: (!) 203.8 kg Body mass index is 59.26 kg/m.   Vital Signs:   Temp:  [97.4 F (36.3 C)-98.7 F (37.1 C)] 97.9 F (36.6 C) (09/16 1047) Pulse Rate:  [77-97] 77 (09/16 1047) Resp:  [0-53] 28 (09/16 1047) BP: (113-156)/(66-91) 156/78 (09/16 1047) SpO2:  [92 %-100 %] 100 % (09/16 0919) Weight:  [203.8 kg] 203.8 kg (09/16 1047) Last BM Date: 05/26/20  Weight change: Filed Weights   05/25/20 0624 05/26/20 0416 05/26/20 1047  Weight: (!) 204.3 kg (!) 203.8 kg (!) 203.8 kg    Intake/Output:   Intake/Output Summary (Last 24 hours) at 05/26/2020 1202 Last data filed at 05/26/2020 0910 Gross per 24 hour  Intake 260 ml  Output 600 ml  Net -340 ml      Physical Exam    General: Morbid obesity Neck: Thick neck. No JVD, no thyromegaly or thyroid nodule.  Lungs: Decreased bilaterally.  CV: Nondisplaced PMI.  Heart regular S1/S2, no S3/S4, no murmur.  No peripheral edema.   Abdomen: Soft, nontender, no hepatosplenomegaly, no distention.  Skin: Intact without lesions or rashes.  Neurologic: Alert and oriented x 3.  Psych: Normal affect. Extremities: No clubbing or cyanosis.  HEENT: Normal.    Telemetry   NSR 80s. No arrhthymias   EKG    No new EKG to review   Labs    CBC Recent Labs     05/24/20 0639 05/24/20 0639 05/25/20 0705 05/25/20 1329 05/25/20 1339 05/25/20 1341  WBC 10.8*  --  9.9  --   --   --   HGB 13.8   < > 15.1   < > 15.6 15.6  HCT 45.9   < > 50.3   < > 46.0 46.0  MCV 80.4  --  81.1  --   --   --   PLT 378  --  390  --   --   --    < > = values in this interval not displayed.   Basic Metabolic Panel Recent Labs    32/12/24 0639 05/24/20 0639 05/25/20 0705 05/25/20 1329 05/25/20 1341 05/26/20 0844  NA 137   < > 140   < > 141 139  K 3.7   < > 4.0   < > 3.8 4.0  CL 98   < > 101  --   --  102  CO2 28   < > 25  --   --  26  GLUCOSE 78   < > 86  --   --  83  BUN 12   < > 13  --   --  11  CREATININE 1.04   < > 1.00  --   --  1.05  CALCIUM 8.9   < > 9.4  --   --  9.5  MG 2.0  --   --   --   --   --    < > = values in this interval not displayed.   Liver Function Tests No results for input(s): AST, ALT, ALKPHOS, BILITOT, PROT, ALBUMIN in the last 72 hours. No results for input(s): LIPASE, AMYLASE in the last 72 hours. Cardiac Enzymes No results for input(s): CKTOTAL, CKMB, CKMBINDEX, TROPONINI in the last 72 hours.  BNP: BNP (last 3 results) Recent Labs    06/08/19 1350 12/11/19 0349 05/23/20 0032  BNP 87.4 108.2* 279.2*    ProBNP (last 3 results) No results for input(s): PROBNP in the last 8760 hours.   D-Dimer No results for input(s): DDIMER in the last 72 hours. Hemoglobin A1C Recent Labs    05/24/20 0639  HGBA1C 6.5*   Fasting Lipid Panel No results for input(s): CHOL, HDL, LDLCALC, TRIG, CHOLHDL, LDLDIRECT in the last 72 hours. Thyroid Function Tests No results for input(s): TSH, T4TOTAL, T3FREE, THYROIDAB in the last 72 hours.  Invalid input(s): FREET3  Other results:   Imaging    CARDIAC CATHETERIZATION  Result Date: 05/25/2020 1. Normal filling pressures after diuresis. 2. Moderate pulmonary arterial hypertension. 3. Preserved cardiac output. 4. There is a step-up in oxygen saturation between SVC and RA (64% =>  72%), shunt fraction 1.3.  Consider ASD.     Medications:     Scheduled Medications: . [MAR Hold] amLODipine  10 mg Oral Daily  . [MAR Hold] aspirin EC  81 mg Oral Daily  . [MAR Hold] carvedilol  6.25 mg Oral BID WC  . [MAR Hold] enoxaparin (LOVENOX) injection  100 mg Subcutaneous Q24H  . [MAR Hold] furosemide  40 mg Oral Daily  . [MAR Hold] insulin aspart  0-9 Units Subcutaneous TID WC  . [MAR Hold] levothyroxine  75 mcg Oral QAC breakfast  . lisinopril  5 mg Oral Daily  . [MAR Hold] mometasone-formoterol  2 puff Inhalation BID  . [MAR Hold] sodium chloride flush  3 mL Intravenous Q12H  . [MAR Hold] spironolactone  12.5 mg Oral Daily    Infusions: . [MAR Hold] sodium chloride 1,000 mL (05/23/20 0926)  . 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  The pulmonary veins appear to lead to the left atrium on CTA this admission though the right pulmonary veins are small.  No hemoptysis.  He presents with possible PNA but actually appears to be in the left lung by CTA.  Can develop endothelial dysfunction with pulmonary hypertension in setting of UAPA, patient has dilated/dysfunctional RV on 4/21 echo.  I do not see evidence for pulmonary valve stenosis on 4/21 echo or by RHC on 05/25/20.  2. Pulmonary hypertension/RV failure: Echo in 4/21 with normal LV systolic function, moderately dilated/dysfunctional RV with d-shaped septum.  As above, Can develop endothelial dysfunction with pulmonary hypertension in setting of UAPA (group 1 PH).  However, patient also has OHS/OSA which can cause pulmonary hypertension/RV failure (group 3 PH).  RHC on 9/15 showed optimized filling pressures with  moderate PAH, PVR 4.1 WU.  - Continue Lasix 40 mg po daily.  - Continue home oxygen + CPAP (encouraged to use while sleep).  - As above, possible group 1 PH  component.  Will trial him on Opsumit, will start as outpatient.  3. HTN: BP elevated.  - Continue amlodipine, Coreg and spironolactone.  - Start back on lisinopril 5 mg daily.  4. ID: Possible left lung PNA.  No fever, WBCs mildly elevated and downtrending.  - Now off doxycycline.    5. OHS/OSA: On 3L home oxygen and CPAP at home since 3/21.  6. ?ASD: Step-up in oxygen saturation on RHC from SVC=>RA.  No ASD or PFO noted on TEE.  Pulmonary veins on CT appeared to drain to the left atrium (though right-sided PVs were small).   From my standpoint, he can go home.  Cardiac meds for home: Lasix 40 mg daily, lisinopril 5 mg daily, spironolactone 12.5 mg daily, amlodipine 10 mg daily, Coreg 6.25 mg bid.  Followup CHF clinic, we will work on getting him Opsumit.   Length of Stay: 3  Marca Ancona, MD  05/26/2020, 12:02 PM  Advanced Heart Failure Team Pager [phone] (M-F; 7a - 4p)  Please contact [facility] for night-coverage after hours (4p -7a ) and weekends on [email or web address]

## 2020-05-26 NOTE — Plan of Care (Signed)
  Problem: Education: Goal: Knowledge of General Education information will improve Description: Including pain rating scale, medication(s)/side effects and non-pharmacologic comfort measures Outcome: Adequate for Discharge   Problem: Health Behavior/Discharge Planning: Goal: Ability to manage health-related needs will improve Outcome: Adequate for Discharge   Problem: Clinical Measurements: Goal: Ability to maintain clinical measurements within normal limits will improve Outcome: Adequate for Discharge Goal: Will remain free from infection Outcome: Adequate for Discharge Goal: Diagnostic test results will improve Outcome: Adequate for Discharge Goal: Respiratory complications will improve Outcome: Adequate for Discharge Goal: Cardiovascular complication will be avoided Outcome: Adequate for Discharge   Problem: Activity: Goal: Risk for activity intolerance will decrease Outcome: Adequate for Discharge   Problem: Nutrition: Goal: Adequate nutrition will be maintained Outcome: Adequate for Discharge   Problem: Coping: Goal: Level of anxiety will decrease Outcome: Adequate for Discharge   Problem: Elimination: Goal: Will not experience complications related to bowel motility Outcome: Adequate for Discharge Goal: Will not experience complications related to urinary retention Outcome: Adequate for Discharge   Problem: Pain Managment: Goal: General experience of comfort will improve Outcome: Adequate for Discharge   Problem: Safety: Goal: Ability to remain free from injury will improve Outcome: Adequate for Discharge   Problem: Skin Integrity: Goal: Risk for impaired skin integrity will decrease Outcome: Adequate for Discharge   Problem: Education: Goal: Ability to demonstrate management of disease process will improve Outcome: Adequate for Discharge Goal: Ability to verbalize understanding of medication therapies will improve Outcome: Adequate for Discharge Goal:  Individualized Educational Video(s) Outcome: Adequate for Discharge   Problem: Activity: Goal: Capacity to carry out activities will improve Outcome: Adequate for Discharge   Problem: Cardiac: Goal: Ability to achieve and maintain adequate cardiopulmonary perfusion will improve Outcome: Adequate for Discharge   Problem: Education: Goal: Understanding of CV disease, CV risk reduction, and recovery process will improve Outcome: Adequate for Discharge Goal: Individualized Educational Video(s) Outcome: Adequate for Discharge   Problem: Activity: Goal: Ability to return to baseline activity level will improve Outcome: Adequate for Discharge   Problem: Cardiovascular: Goal: Ability to achieve and maintain adequate cardiovascular perfusion will improve Outcome: Adequate for Discharge Goal: Vascular access site(s) Level 0-1 will be maintained Outcome: Adequate for Discharge   Problem: Health Behavior/Discharge Planning: Goal: Ability to safely manage health-related needs after discharge will improve Outcome: Adequate for Discharge   

## 2020-05-27 ENCOUNTER — Encounter (HOSPITAL_COMMUNITY): Payer: Self-pay | Admitting: Cardiology

## 2020-05-27 MED ORDER — MACITENTAN 10 MG PO TABS: 10.0000 mg | ORAL_TABLET | Freq: Every day | ORAL | 11 refills | Status: AC

## 2020-05-27 NOTE — Telephone Encounter (Signed)
Sent in Opsum[employer] patient enrollment application and Opsumit REMS enrollment form to [employer].    Application pending, will continue to follow.   Karle Plumber, PharmD, BCPS, BCCP, CPP Heart Failure Clinic Pharmacist [phone]

## 2020-06-06 ENCOUNTER — Encounter (HOSPITAL_COMMUNITY): Payer: No Typology Code available for payment source

## 2020-06-07 ENCOUNTER — Encounter (HOSPITAL_COMMUNITY): Payer: No Typology Code available for payment source

## 2020-06-08 ENCOUNTER — Inpatient Hospital Stay: Payer: No Typology Code available for payment source | Admitting: Pulmonary Disease

## 2020-06-10 ENCOUNTER — Telehealth (HOSPITAL_COMMUNITY): Payer: Self-pay | Admitting: Pharmacy Technician

## 2020-06-10 NOTE — Telephone Encounter (Signed)
[facility] sent over a fax requesting for the provider to sign the patient's Opsumit.  Sent in RX via fax.  Archer Asa, CPhT

## 2020-06-15 ENCOUNTER — Telehealth (HOSPITAL_COMMUNITY): Payer: Self-pay | Admitting: Pharmacy Technician

## 2020-06-15 NOTE — Telephone Encounter (Signed)
Called [facility] to confirm that they received the patient's prescription. The representative confirmed that they did receive the RX and that they have been trying to get a hold of the patient to confirm insurance benefits and have been unsuccessful.   I know that Benjamin Rosario [facility]) sends the patient's insurance cards with all the applications and upon further review, the rep stated that the patient's insurance was termed on 06/09/2020. The patient would need to call in to [phone] to [name] at [phone] [id number] to start the assistance process.  The phone number we have on file for the patient is not working. Spoke with the patient's mother. I provided her the phone number and extension to call. I advised her to get her son to add her on to his case so that [facility] would be able to speak with her in the future.  Will follow up.

## 2020-06-22 NOTE — Telephone Encounter (Signed)
Spoke with the patient today. He is going to come and sign the patient assistance forms for [employer] to help with Opsumit. Application will be at the check in desk for him to sign.  Will fax once signatures are obtained

## 2020-07-07 NOTE — Telephone Encounter (Signed)
Called and left message reminding the patient about the assistance application awaiting his signature.  Will be here in the future to assist as needed.  Archer Asa, CPhT

## 2020-11-24 ENCOUNTER — Telehealth: Payer: Self-pay | Admitting: Pulmonary Disease

## 2020-11-24 NOTE — Telephone Encounter (Signed)
Called Dr. Paulette Blanch office and spoke with Misty Stanley who stated that pt has recently moved there and they are trying to get pt back on the O2 but in order to be able to do this, they need the OV prior to the sleep study that was done so they can get pt started back on O2.  Was provided fax number by Misty Stanley and have faxed info to her at provided fax number of [phone]. Nothing further needed.

## 2020-12-09 IMAGING — DX DG CHEST 1V PORT
2 series · 2 of 2 positions shown · non-contrast
Comparison: January 25, 2019

CLINICAL DATA: Shortness of breath

EXAM:
PORTABLE CHEST 1 VIEW

[chest ap (1 of 2)]
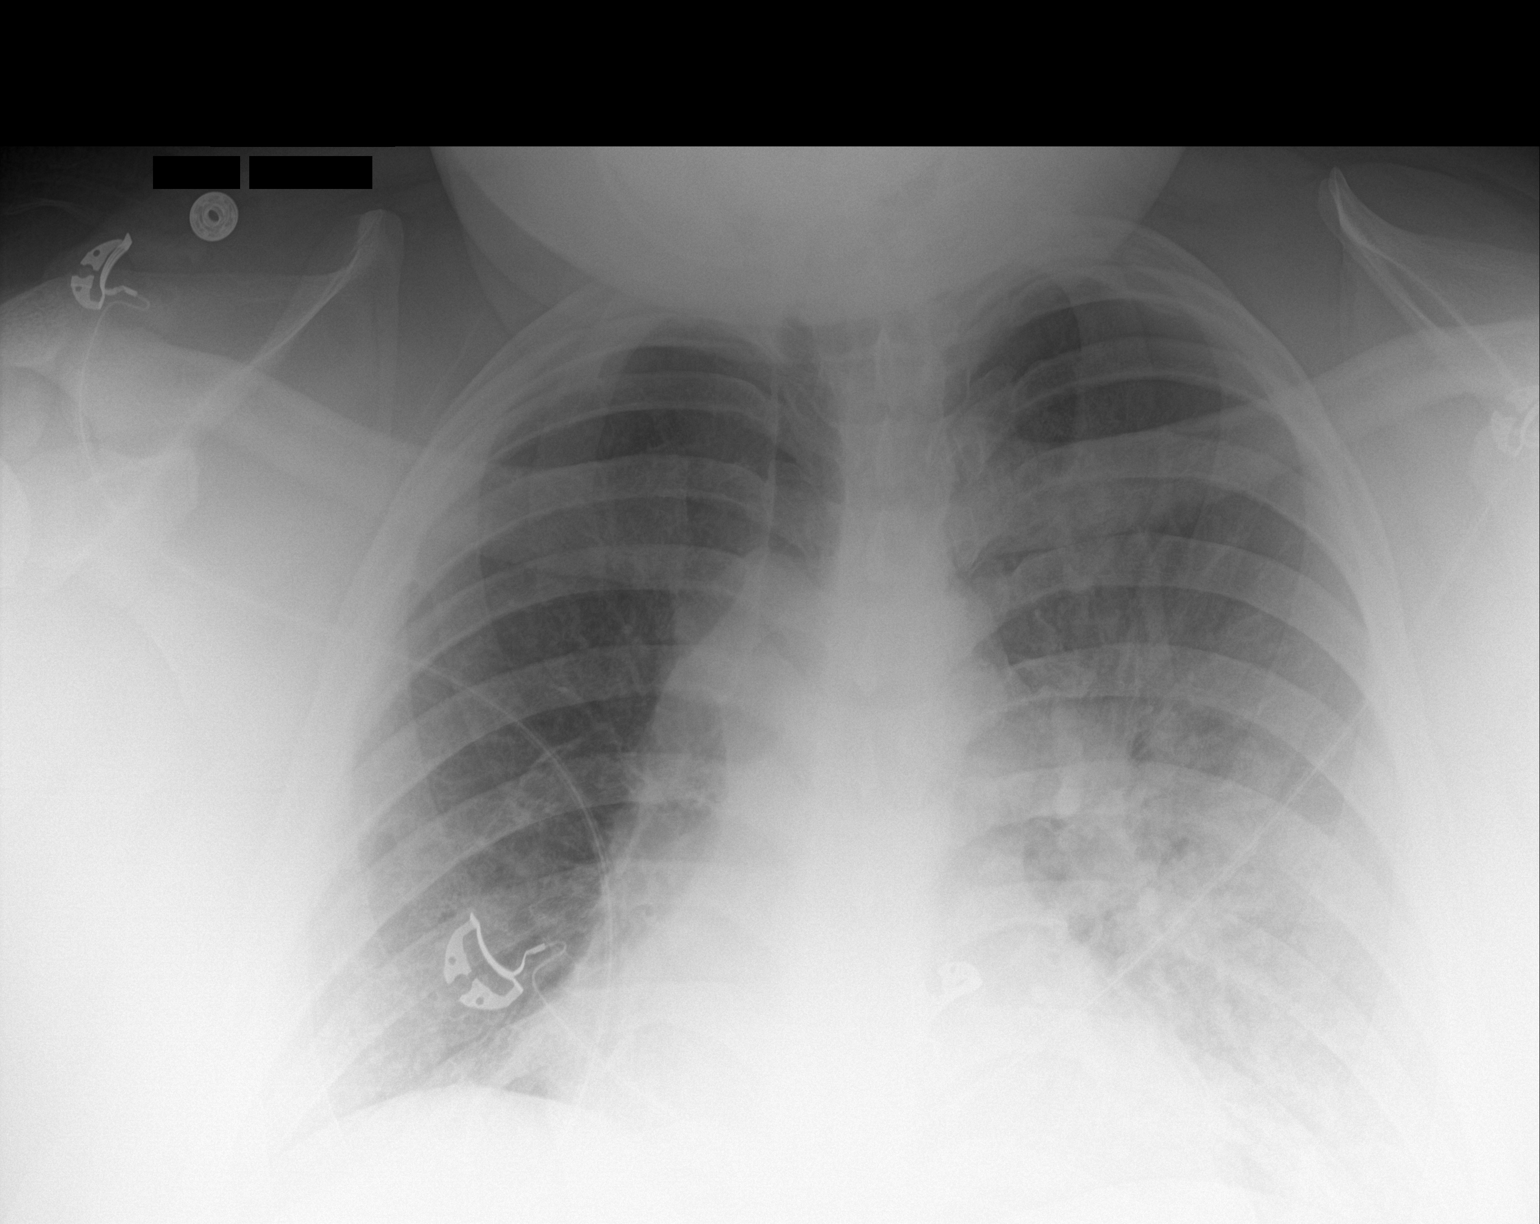

[chest ap (2 of 2)]
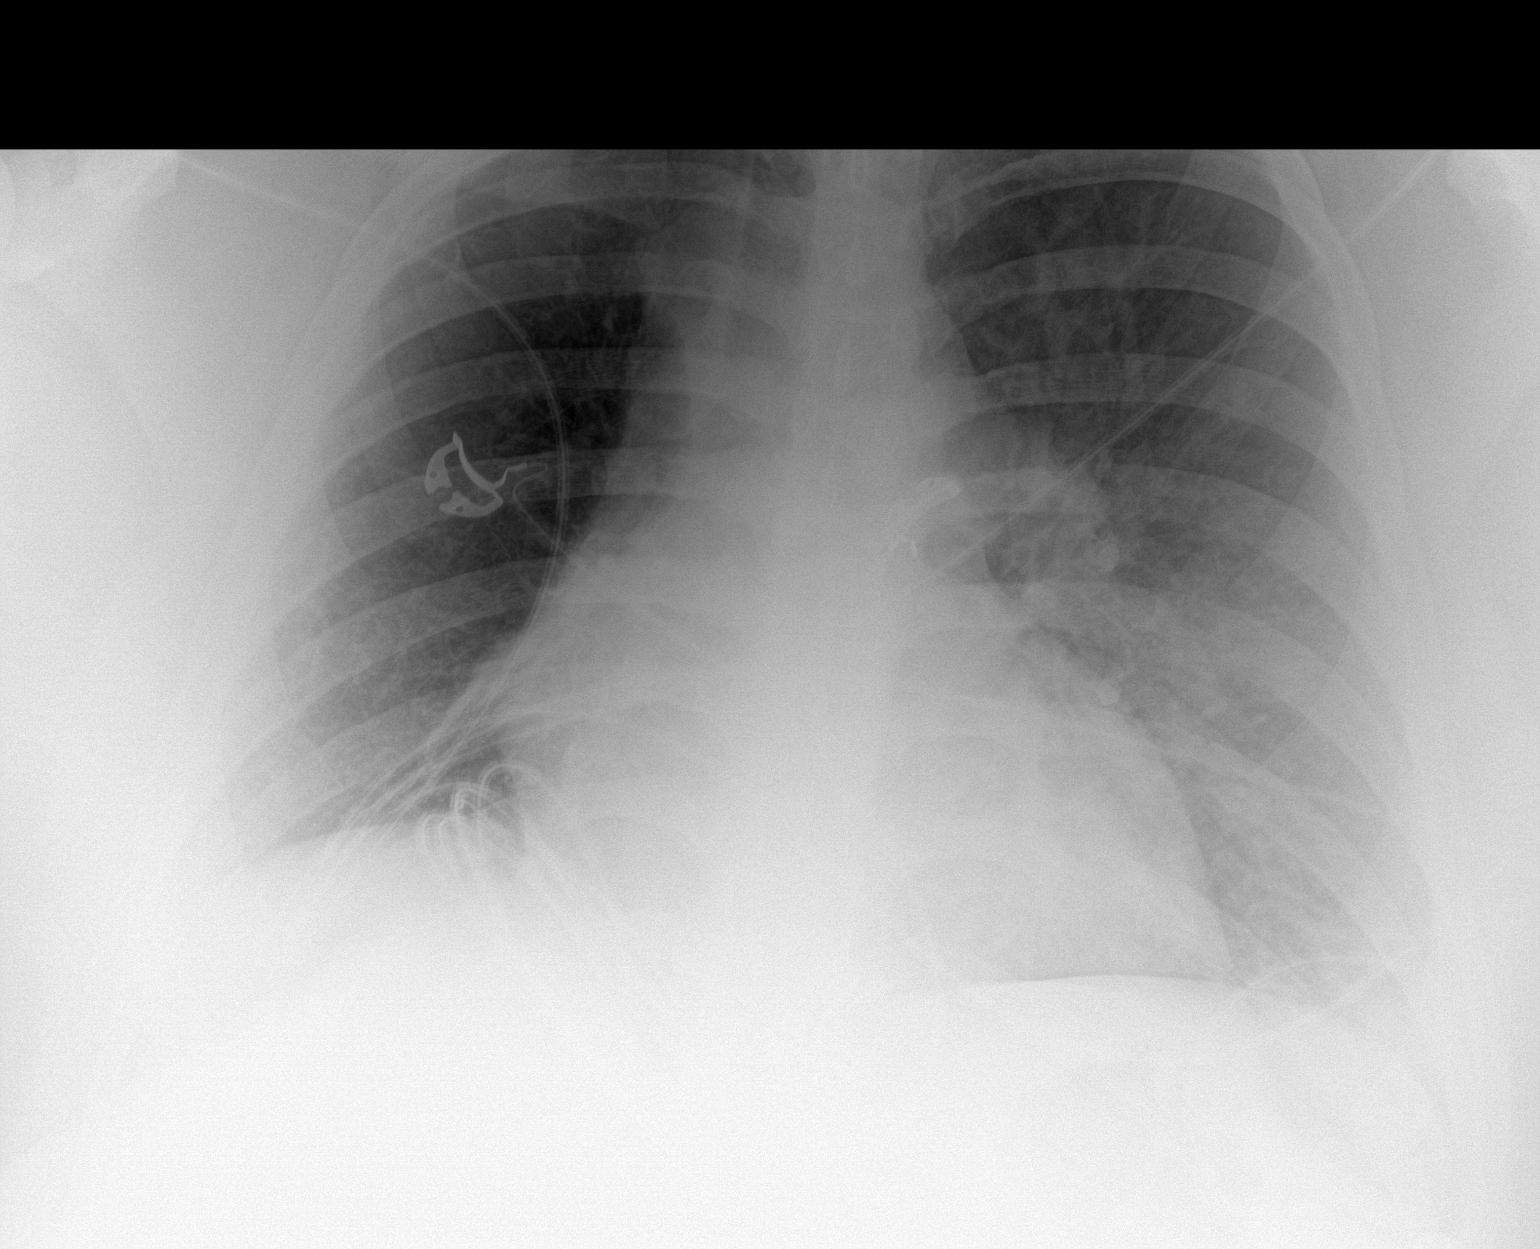

[2 of 2 positions shown; findings below may reference images not displayed]

FINDINGS: No pneumothorax. The right lung is clear. There is opacity in the
left mid lower lung. The cardiomediastinal silhouette is stable with
cardiomegaly. No pneumothorax.
IMPRESSION: Infiltrate in left base worrisome for pneumonia. Atypical edema
considered less likely. Recommend follow-up imaging to ensure
complete resolution. No other changes.

## 2020-12-12 IMAGING — DX DG CHEST 1V PORT
1 series · 1 of 1 positions shown · non-contrast
Comparison: 06/07/2019

CLINICAL DATA: Cough.  Shortness of breath.

EXAM:
PORTABLE CHEST 1 VIEW

[chest ap]
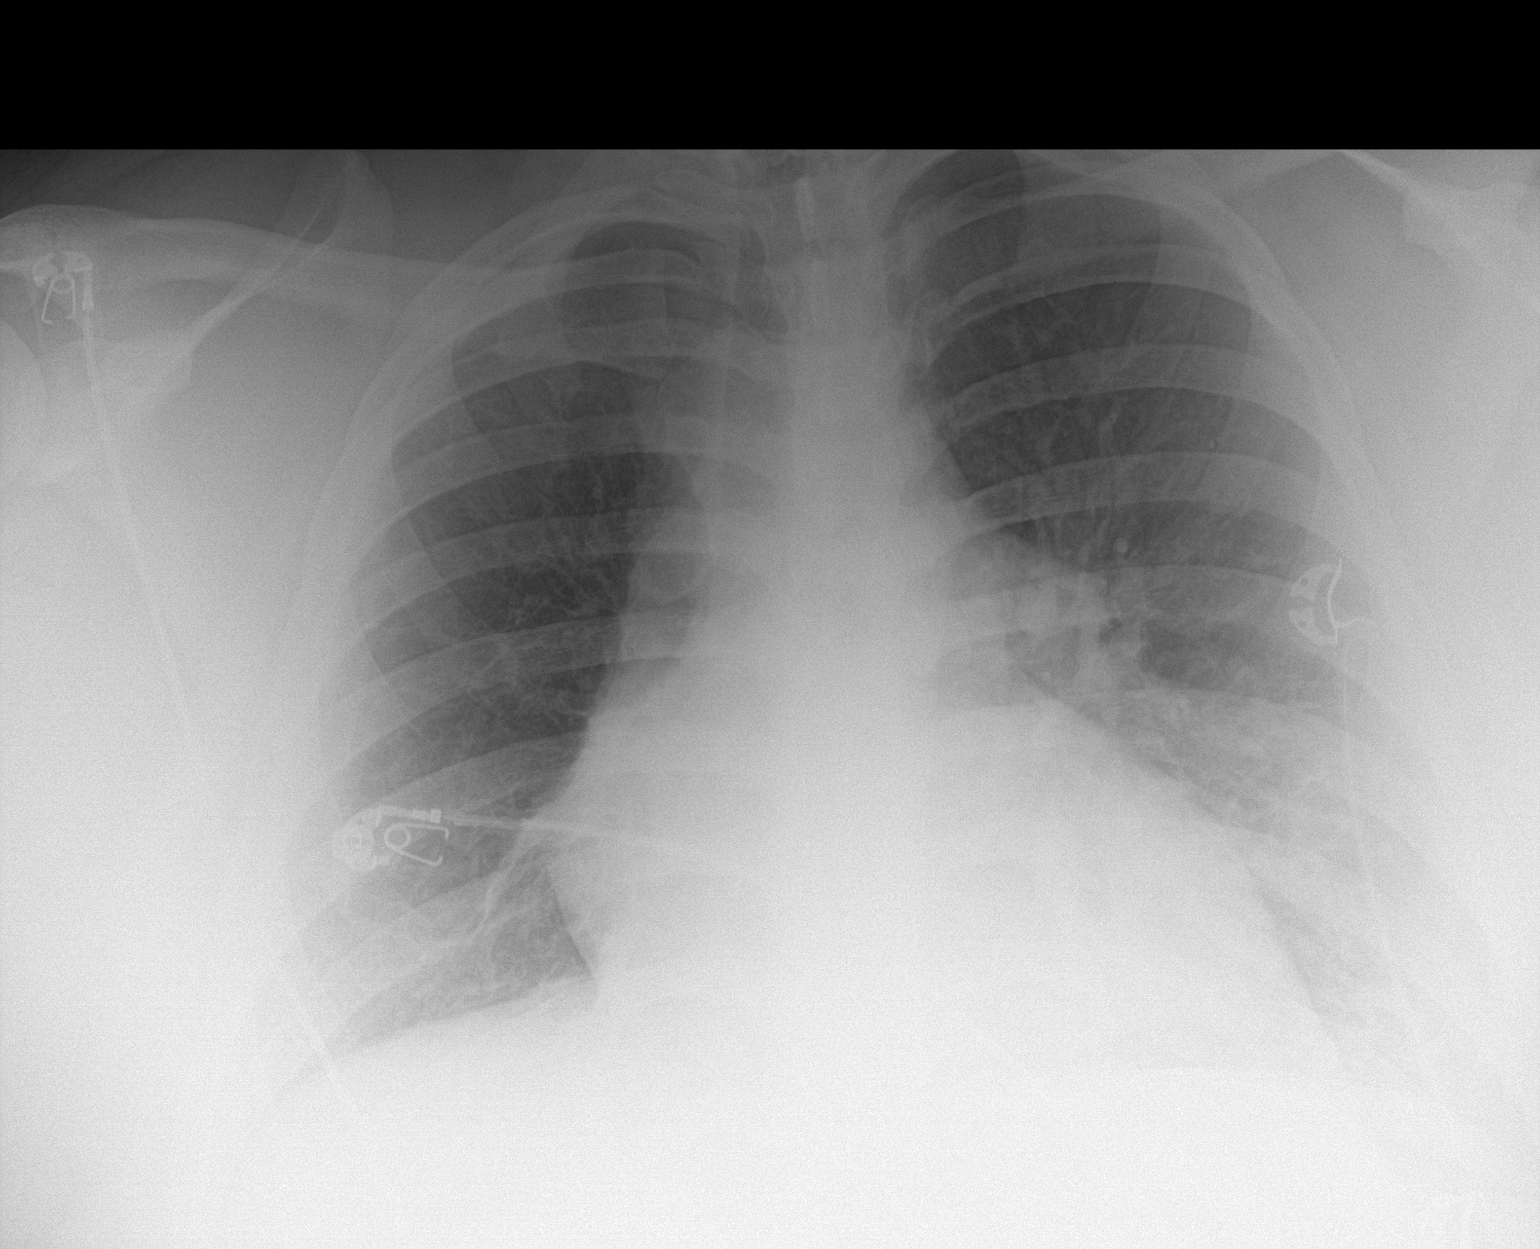

[1 of 1 positions shown; findings below may reference images not displayed]

FINDINGS: There is a persistent left lower lung zone infiltrate with some
improvement since the prior study. The heart size is essentially
stable from prior study. There is no pneumothorax. No large pleural
effusion. No pneumothorax.
IMPRESSION: Persistent but improving left lower lung zone infiltrate.

## 2021-06-13 IMAGING — CT CT ANGIO CHEST
3 of 9 series · 15 of 46 positions shown · IV contrast (omnipaque)
Comparison: 09/19/2019, chest x-ray from earlier in the same day.

CLINICAL DATA: Shortness of breath and chest pain, history of prior
TXE3Y-XX positivity

EXAM:
CT ANGIOGRAPHY CHEST WITH CONTRAST
TECHNIQUE: Multidetector CT imaging of the chest was performed using the
standard protocol during bolus administration of intravenous
contrast. Multiplanar CT image reconstructions and MIPs were
obtained to evaluate the vascular anatomy.
CONTRAST:  75mL OMNIPAQUE IOHEXOL 350 MG/ML SOLN

[Series 7: arterial · axial · arterial · 0.85mm/px · z∈[+36,+188]mm · 4 of 128 slices shown]
[im 26/128  lung]
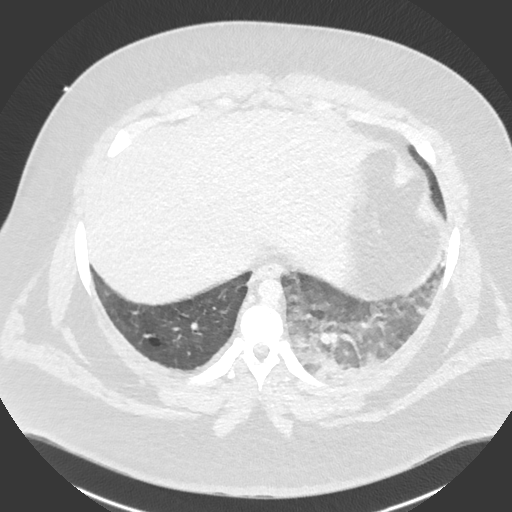
[im 51/128  soft-tissue]
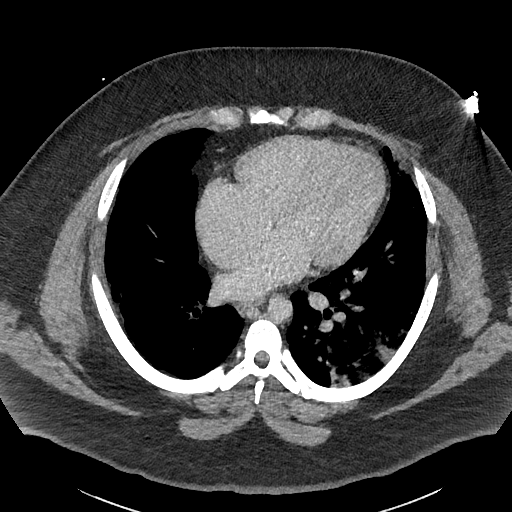
[im 77/128  lung]
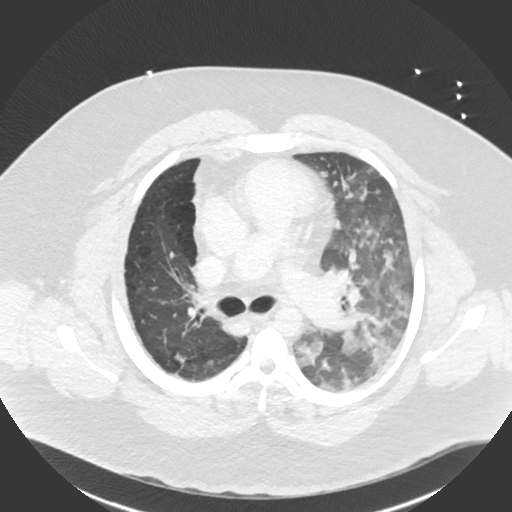
[im 102/128  soft-tissue]
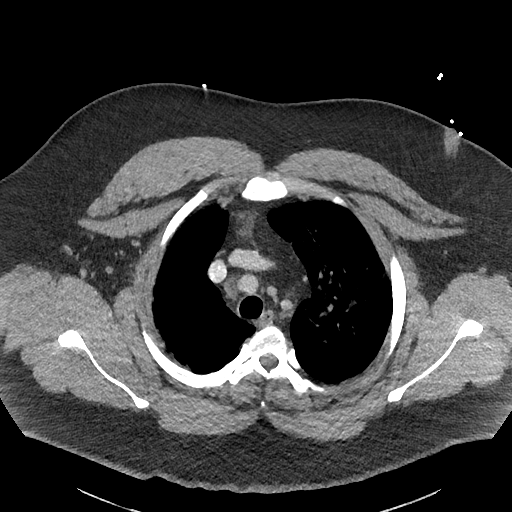

[Series 15: thins · axial · 0.98mm/px · z∈[+40,+220]mm · 8 of 309 slices shown]
[im 26/309  lung]
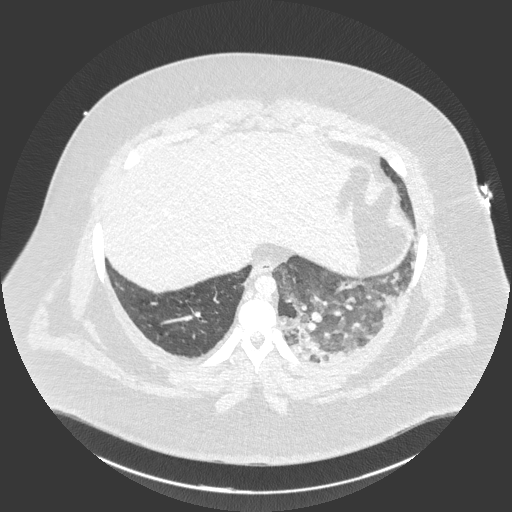
[im 78/309  lung]
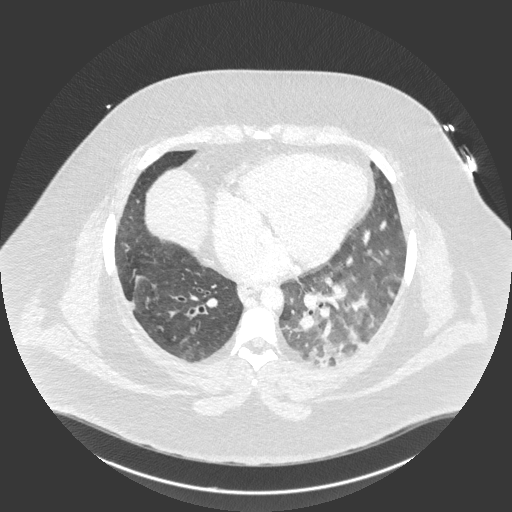
[im 103/309  lung]
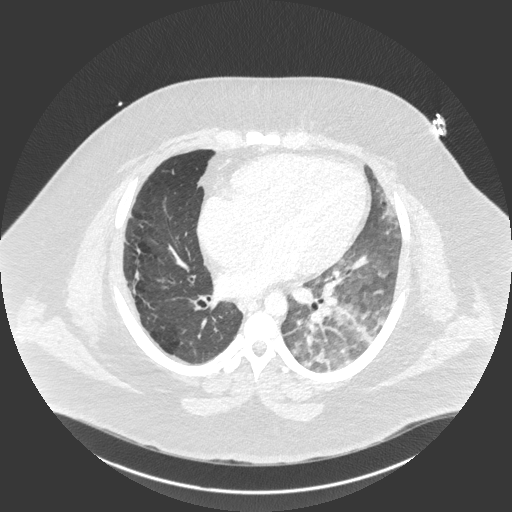
[im 129/309  lung]
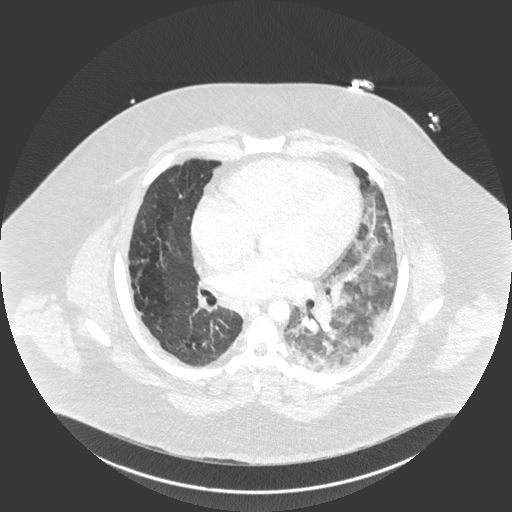
[im 180/309  lung]
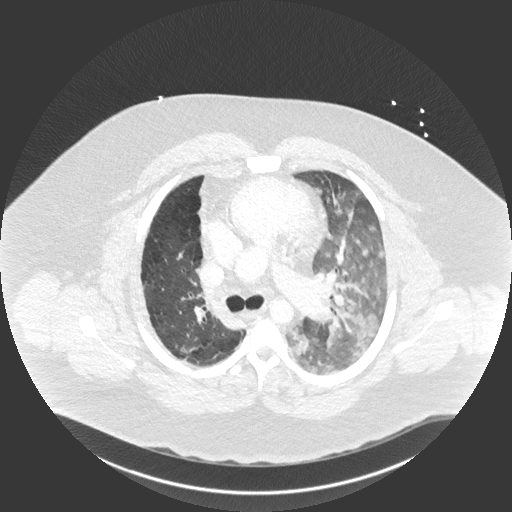
[im 206/309  lung]
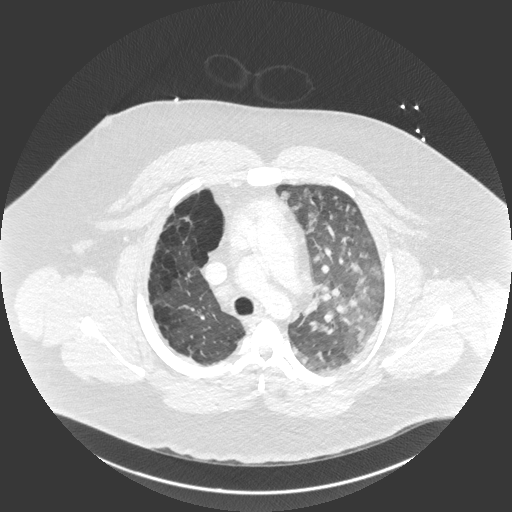
[im 232/309  lung]
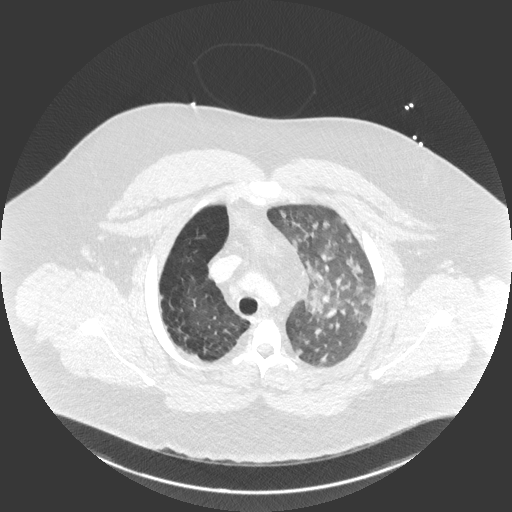
[im 283/309  lung]
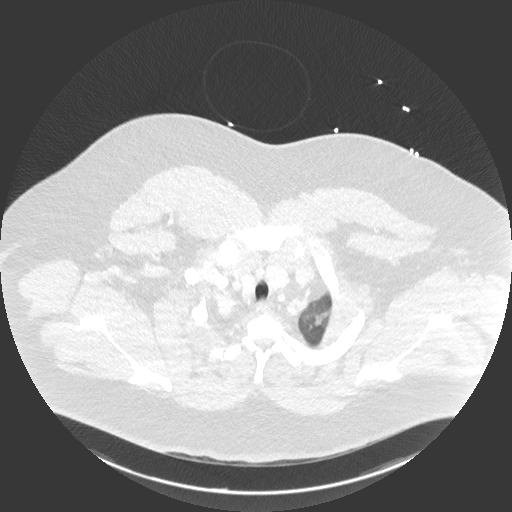

[Series 16: cor · coronal · 0.62mm/px · 3 of 198 slices shown]
[im 50/198  soft-tissue]
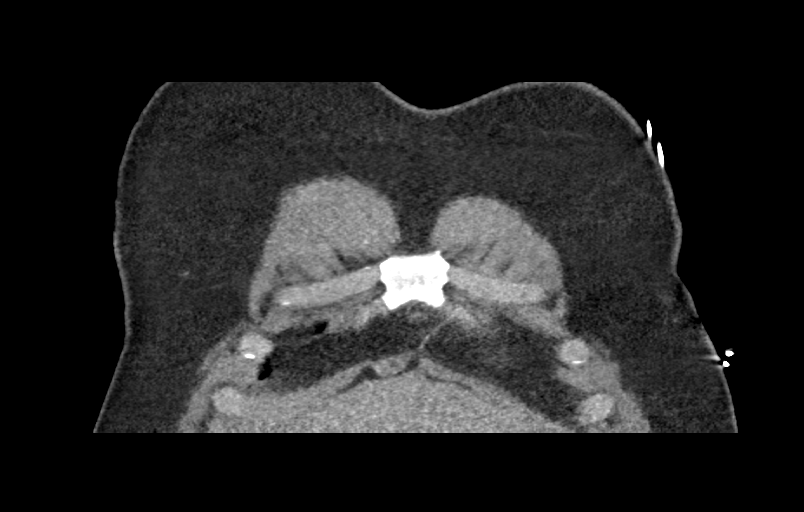
[im 99/198  soft-tissue]
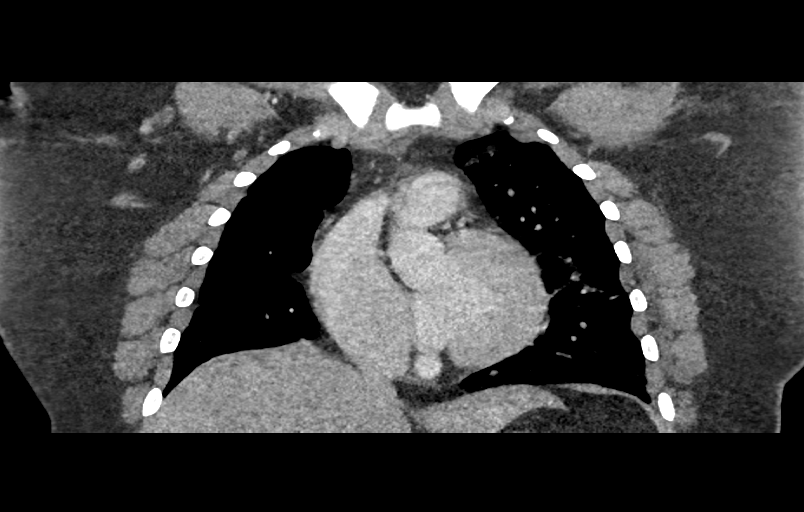
[im 148/198  soft-tissue]
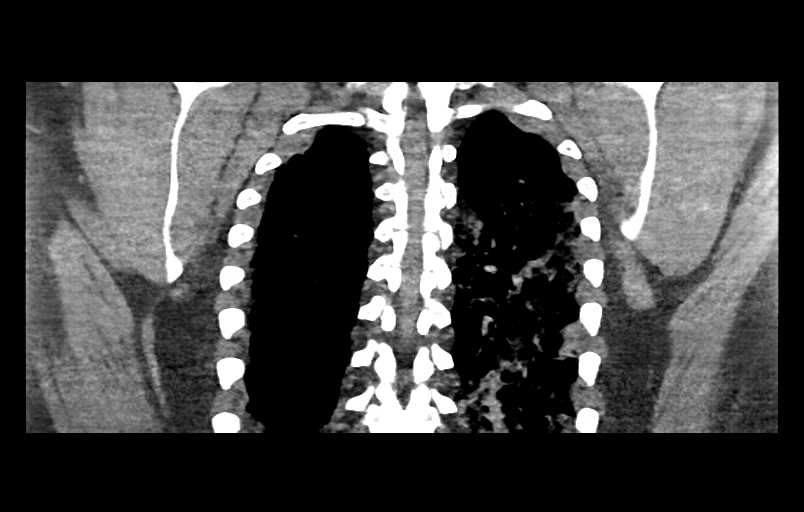

[15 of 46 positions shown; findings below may reference images not displayed]

FINDINGS: Cardiovascular: Thoracic aorta shows a normal branching pattern. No
aneurysmal dilatation or dissection is seen. No significant cardiac
enlargement is noted. The pulmonary artery shows absence of the
right pulmonary artery stable from the prior CT examination. The
opacification of the pulmonary artery is somewhat limited secondary
to the patient's body habitus and contrast timing bolus despite 2
attempts for optimum imaging. This is similar to that seen in
Wednesday September, 2019. No large central pulmonary embolus is seen. No
pericardial effusion is noted.

Mediastinum/Nodes: Thoracic inlet is within normal limits. Scattered
small mediastinal lymph nodes are noted. These are likely reactive
in nature. The esophagus as visualized is within normal limits.

Lungs/Pleura: Right lung demonstrates significant emphysematous
changes and volume loss consistent with the absent pulmonary artery.
These changes are stable from the prior exam. The left lung is well
aerated and demonstrates patchy infiltrates throughout the left
lung. This is consistent with that seen on recent chest x-ray
consistent with multifocal pneumonia. Additionally there may be some
sequelae from the patient's known TXE3Y-XX infection. No sizable
effusion is noted. Focal pleural thickening is again noted in the
left apex laterally stable dating back to 9898 and likely of a
benign etiology.

Upper Abdomen: Visualized upper abdomen shows no acute abnormality.

Musculoskeletal: Bony structures are within normal limits.

Review of the MIP images confirms the above findings.
IMPRESSION: Somewhat limited evaluation for pulmonary embolism as described
above. No large central embolus is seen.

Unilateral absence of the pulmonary artery on the right stable from
the prior exam.

Chronic changes in the right lung with volume loss and scarring as
well as emphysematous changes consistent with the congenital
abnormality of the right pulmonary artery.

New multifocal infiltrate throughout the left lung similar to that
seen on prior plain film examination. This likely represents a
combination of sequelae from the prior TXE3Y-XX infection as well as
acute superimposed infiltrate given the acute history.

## 2023-05-28 ENCOUNTER — Emergency Department (HOSPITAL_COMMUNITY): Payer: 59

## 2023-05-28 ENCOUNTER — Encounter (HOSPITAL_COMMUNITY): Payer: Self-pay | Admitting: Emergency Medicine

## 2023-05-28 ENCOUNTER — Emergency Department (HOSPITAL_COMMUNITY)
Admission: EM | Admit: 2023-05-28 | Discharge: 2023-05-29 | Discharge status: Against Medical Advice | Payer: 59 | Attending: Emergency Medicine | Admitting: Emergency Medicine

## 2023-05-28 ENCOUNTER — Other Ambulatory Visit: Payer: Self-pay

## 2023-05-28 DIAGNOSIS — R519 Headache, unspecified: Secondary | ICD-10-CM | POA: Insufficient documentation

## 2023-05-28 DIAGNOSIS — Y92410 Unspecified street and highway as the place of occurrence of the external cause: Secondary | ICD-10-CM | POA: Insufficient documentation

## 2023-05-28 DIAGNOSIS — Z5321 Procedure and treatment not carried out due to patient leaving prior to being seen by health care provider: Secondary | ICD-10-CM | POA: Insufficient documentation

## 2023-05-28 DIAGNOSIS — M79605 Pain in left leg: Secondary | ICD-10-CM | POA: Diagnosis not present
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
## 2023-05-30 ENCOUNTER — Ambulatory Visit
Admission: EM | Admit: 2023-05-30 | Discharge: 2023-05-30 | Disposition: A | Discharge status: Home / Self Care | Payer: Medicaid Other | Attending: Internal Medicine | Admitting: Internal Medicine

## 2023-05-30 DIAGNOSIS — M546 Pain in thoracic spine: Secondary | ICD-10-CM | POA: Diagnosis not present

## 2023-05-30 DIAGNOSIS — M545 Low back pain, unspecified: Secondary | ICD-10-CM | POA: Diagnosis not present
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
## 2023-07-30 ENCOUNTER — Ambulatory Visit
Admission: EM | Admit: 2023-07-30 | Discharge: 2023-07-30 | Disposition: A | Discharge status: Home / Self Care | Payer: 59 | Attending: Internal Medicine | Admitting: Internal Medicine

## 2023-07-30 ENCOUNTER — Ambulatory Visit: Payer: 59

## 2023-07-30 DIAGNOSIS — J988 Other specified respiratory disorders: Secondary | ICD-10-CM | POA: Insufficient documentation

## 2023-07-30 DIAGNOSIS — B9789 Other viral agents as the cause of diseases classified elsewhere: Secondary | ICD-10-CM | POA: Diagnosis present

## 2023-07-30 DIAGNOSIS — J4530 Mild persistent asthma, uncomplicated: Secondary | ICD-10-CM | POA: Insufficient documentation

## 2023-07-30 DIAGNOSIS — J069 Acute upper respiratory infection, unspecified: Secondary | ICD-10-CM

## 2023-07-30 MED ORDER — PREDNISONE 20 MG PO TABS: ORAL_TABLET | ORAL | 0 refills | Status: AC

## 2023-07-30 MED ORDER — PROMETHAZINE-DM 6.25-15 MG/5ML PO SYRP: 5.0000 mL | ORAL_SOLUTION | Freq: Three times a day (TID) | ORAL | 0 refills | Status: AC | PRN

## 2023-07-30 MED ORDER — ALBUTEROL SULFATE HFA 108 (90 BASE) MCG/ACT IN AERS: 2.0000 | INHALATION_SPRAY | Freq: Four times a day (QID) | RESPIRATORY_TRACT | 0 refills | Status: AC | PRN

## 2023-07-30 NOTE — Discharge Instructions (Signed)
We will notify you of your test results as they arrive and may take between about 24 hours.  I encourage you to sign up for MyChart if you have not already done so as this can be the easiest way for [place] to communicate results to you online or through a phone app.  Generally, we only contact you if it is a positive test result.  In the meantime, if you develop worsening symptoms including fever, chest pain, shortness of breath despite our current treatment plan then please report to the emergency room as this may be a sign of worsening status from possible viral infection.  Otherwise, we will manage this as a viral respiratory infection. For sore throat or cough try using a honey-based tea. Use 3 teaspoons of honey with juice squeezed from half lemon. Place shaved pieces of ginger into 1/2-1 cup of water and warm over stove top. Then mix the ingredients and repeat every 4 hours as needed. Please take Tylenol 500mg -650mg  every 6 hours for aches and pains, fevers. Hydrate very well with at least 2 liters of water. Eat light meals such as soups to replenish electrolytes and soft fruits, veggies. Start an antihistamine like Zyrtec (10mg  daily) for postnasal drainage, sinus congestion.  You can take this together with prednisone and albuterol inhaler.  Use the cough medications as needed.

## 2023-07-30 NOTE — ED Triage Notes (Signed)
Pt c/o nasal congestion, sinus pain/pressure, body aches, fatigue, dry cough x 3 days-taking mucinex with no relief-denies fever-NAD-steady gait

## 2023-07-30 NOTE — ED Provider Notes (Signed)
[facility]  Note:  This document was prepared using Conservation officer, historic buildings and may include unintentional dictation errors.  MRN: [id number] DOB: 27-Feb-2001  Subjective:   Benjamin Rosario is a 22 y.o. male presenting for 3-day history of sinus congestion, sinus pressure, sinus inflammation, body pains, fatigue, dry cough, chest tightness and shortness of breath.  Has a history of asthma and needs a refill of his inhaler.  Has a history of pneumonia, reports 7 distinct episodes.  Has a history of congestive heart failure, last echocardiogram was from 2021, had an EF of 55%-60%.  Reports that it has been well-controlled and has not had issues with this.  No smoking of any kind including cigarettes, cigars, vaping, marijuana use.    No current facility-administered medications for this encounter.  Current Outpatient Medications:    albuterol (PROVENTIL HFA;VENTOLIN HFA) 108 (90 Base) MCG/ACT inhaler, Inhale 2 puffs into the lungs every 6 (six) hours as needed for wheezing or shortness of breath., Disp: 1 Inhaler, Rfl: 2   amLODipine (NORVASC) 10 MG tablet, Take 1 tablet (10 mg total) by mouth daily., Disp: 90 tablet, Rfl: 1   carvedilol (COREG) 6.25 MG tablet, Take 1 tablet (6.25 mg total) by mouth 2 (two) times daily with a meal., Disp: 60 tablet, Rfl: 1   furosemide (LASIX) 40 MG tablet, Take 1 tablet (40 mg total) by mouth daily., Disp: 30 tablet, Rfl: 1   levothyroxine (SYNTHROID) 75 MCG tablet, Take 1 tablet (75 mcg total) by mouth daily before breakfast., Disp: 30 tablet, Rfl: 3   lisinopril (ZESTRIL) 5 MG tablet, Take 1 tablet (5 mg total) by mouth daily., Disp: 30 tablet, Rfl: 0   macitentan (OPSUMIT) 10 MG tablet, Take 1 tablet (10 mg total) by mouth daily., Disp: 30 tablet, Rfl: 11   mometasone-formoterol (DULERA) 100-5 MCG/ACT AERO, Inhale 2 puffs into the lungs 2 (two) times daily. (Patient taking differently: Inhale 2 puffs into the lungs 2 (two) times  daily as needed for wheezing or shortness of breath.), Disp: 13 g, Rfl: 4   spironolactone (ALDACTONE) 25 MG tablet, Take 0.5 tablets (12.5 mg total) by mouth daily., Disp: 30 tablet, Rfl: 0   No Known Allergies  Past Medical History:  Diagnosis Date   ADHD (attention deficit hyperactivity disorder)    Asthma    Chlamydia 11/25/2018   Diabetes mellitus without complication (HCC)    Emphysema of lung (HCC) 11/25/2018   Environmental allergies    Hypothyroidism 11/25/2018   Sleep apnea      Past Surgical History:  Procedure Laterality Date   BUBBLE STUDY  05/26/2020   Procedure: BUBBLE STUDY;  Surgeon: Laurey Morale, MD;  Location: [facility] ENDOSCOPY;  Service: Cardiovascular;;   RIGHT HEART CATH N/A 05/25/2020   Procedure: RIGHT HEART CATH;  Surgeon: Laurey Morale, MD;  Location: [facility] INVASIVE CV LAB;  Service: Cardiovascular;  Laterality: N/A;   TEE WITHOUT CARDIOVERSION N/A 05/26/2020   Procedure: TRANSESOPHAGEAL ECHOCARDIOGRAM (TEE);  Surgeon: Laurey Morale, MD;  Location: [facility] ENDOSCOPY;  Service: Cardiovascular;  Laterality: N/A;    Family History  Problem Relation Age of Onset   Hypertension Father    Diabetes Father     Social History   Tobacco Use   Smoking status: Never   Smokeless tobacco: Never  Vaping Use   Vaping status: Former  Substance Use Topics   Alcohol use: Yes    Comment: occ   Drug use: Yes    Types:  Marijuana    ROS   Objective:   Vitals: BP 136/81 (BP Location: Right Arm)   Temp 99.1 F (37.3 C) (Oral)   Resp 20   SpO2 95%   Physical Exam Constitutional:      General: He is not in acute distress.    Appearance: Normal appearance. He is well-developed and normal weight. He is not ill-appearing, toxic-appearing or diaphoretic.  HENT:     Head: Normocephalic and atraumatic.     Right Ear: Tympanic membrane, ear canal and external ear normal. No drainage, swelling or tenderness. No middle ear effusion. There is no impacted cerumen. Tympanic  membrane is not erythematous or bulging.     Left Ear: Tympanic membrane, ear canal and external ear normal. No drainage, swelling or tenderness.  No middle ear effusion. There is no impacted cerumen. Tympanic membrane is not erythematous or bulging.     Nose: Congestion present. No rhinorrhea.     Mouth/Throat:     Mouth: Mucous membranes are moist.     Pharynx: No oropharyngeal exudate or posterior oropharyngeal erythema.  Eyes:     General: No scleral icterus.       Right eye: No discharge.        Left eye: No discharge.     Extraocular Movements: Extraocular movements intact.     Conjunctiva/sclera: Conjunctivae normal.  Cardiovascular:     Rate and Rhythm: Normal rate and regular rhythm.     Heart sounds: Normal heart sounds. No murmur heard.    No friction rub. No gallop.  Pulmonary:     Effort: Pulmonary effort is normal. No respiratory distress.     Breath sounds: No stridor. No wheezing, rhonchi or rales.     Comments: Decreased lung sounds throughout which may be a result of body habitus versus asthma versus an infectious process. Musculoskeletal:     Cervical back: Normal range of motion and neck supple. No rigidity. No muscular tenderness.  Neurological:     General: No focal deficit present.     Mental Status: He is alert and oriented to person, place, and time.  Psychiatric:        Mood and Affect: Mood normal.        Behavior: Behavior normal.        Thought Content: Thought content normal.     Assessment and Plan :   PDMP not reviewed this encounter.  1. Viral respiratory infection   2. Mild persistent asthma, uncomplicated    Given the respiratory symptoms, underlying asthma recommended a oral prednisone course.  Refilled the albuterol.  COVID testing pending.  X-ray over-read was pending at time of discharge, recommended follow up with only abnormal results. Otherwise will not call for negative over-read. Patient was in agreement. Counseled patient on  potential for adverse effects with medications prescribed/recommended today, ER and return-to-clinic precautions discussed, patient verbalized understanding.    Wallis Bamberg, PA-C 07/30/23 1430

## 2023-07-31 LAB — SARS CORONAVIRUS 2 (TAT 6-24 HRS): SARS Coronavirus 2: NEGATIVE

## 2023-08-21 ENCOUNTER — Ambulatory Visit
Admission: EM | Admit: 2023-08-21 | Discharge: 2023-08-21 | Disposition: A | Discharge status: Home / Self Care | Payer: 59 | Attending: Family Medicine | Admitting: Family Medicine

## 2023-08-21 DIAGNOSIS — H8110 Benign paroxysmal vertigo, unspecified ear: Secondary | ICD-10-CM

## 2023-08-21 MED ORDER — ONDANSETRON 4 MG PO TBDP: 4.0000 mg | ORAL_TABLET | Freq: Three times a day (TID) | ORAL | 0 refills | Status: AC | PRN

## 2023-08-21 MED ORDER — MECLIZINE HCL 25 MG PO TABS: 25.0000 mg | ORAL_TABLET | Freq: Three times a day (TID) | ORAL | 0 refills | Status: AC | PRN

## 2023-08-21 NOTE — ED Triage Notes (Signed)
Pt c/o dizziness x 2 days [place] the [place]-NAD-steady gait

## 2023-08-24 NOTE — ED Provider Notes (Signed)
[facility] CARE CENTER   [id number] 08/21/23 Arrival Time: 1320  ASSESSMENT & PLAN:  1. Benign paroxysmal positional vertigo, unspecified laterality     Normal neurologic exam. No suspicion for ICH or SAH. No indication for neurodiagnostic imaging at this time.  Trial of: Meds ordered this encounter  Medications   meclizine (ANTIVERT) 25 MG tablet    Sig: Take 1 tablet (25 mg total) by mouth 3 (three) times daily as needed for dizziness.    Dispense:  30 tablet    Refill:  0   ondansetron (ZOFRAN-ODT) 4 MG disintegrating tablet    Sig: Take 1 tablet (4 mg total) by mouth every 8 (eight) hours as needed for nausea or vomiting.    Dispense:  15 tablet    Refill:  0   Reassured that these symptoms do not appear to represent a serious or threatening condition. This is generally a self-limited temporary but uncomfortable situation. Rest, avoid potentially dangerous activities (such as driving or working with machinery or at heights). Use meclizine prn. Will proceed to the ED if he develops other symptoms such as alterations of speech, swallowing, vision, motor/sensory systems, or if dizziness worsens.  Work note provided.  Reviewed expectations re: course of current medical issues. Questions answered. Outlined signs and symptoms indicating need for more acute intervention. Patient verbalized understanding. After Visit Summary given.   SUBJECTIVE:  Benjamin Rosario is a 22 y.o. male who reports dizziness he describes as vertigo; gradual onset; x 2 days; slightly better today. Denies head injury. Ambulatory here. No extremity sensation changes or weakness. No tx PTA. Does report head movements make symptoms worse. Normal vision/hearing.  Social History   Substance and Sexual Activity  Alcohol Use Yes   Comment: occ   Social History   Tobacco Use  Smoking Status Never  Smokeless Tobacco Never     OBJECTIVE:  Vitals:   08/21/23 1343  BP: (!) 146/82  Pulse: 82  Resp: 20   Temp: 98.1 F (36.7 C)  TempSrc: Oral  SpO2: 94%    General appearance: alert; no distress Eyes: PERRLA; EOMI; conjunctiva normal HENT: normocephalic; atraumatic; TMs normal; nasal mucosa normal; oral mucosa normal Neck: supple with FROM Lungs: clear to auscultation bilaterally Heart: regular rate and rhythm Skin: warm and dry Neurologic: normal gait; CN 2-12 grossly intact Psychological: alert and cooperative; normal mood and affect  No Known Allergies  Past Medical History:  Diagnosis Date   ADHD (attention deficit hyperactivity disorder)    Asthma    Chlamydia 11/25/2018   Diabetes mellitus without complication (HCC)    Emphysema of lung (HCC) 11/25/2018   Environmental allergies    Hypothyroidism 11/25/2018   Sleep apnea    Social History   Socioeconomic History   Marital status: Single    Spouse name: Not on file   Number of children: Not on file   Years of education: Not on file   Highest education level: Not on file  Occupational History   Not on file  Tobacco Use   Smoking status: Never   Smokeless tobacco: Never  Vaping Use   Vaping status: Former  Substance and Sexual Activity   Alcohol use: Yes    Comment: occ   Drug use: Yes    Types: Marijuana   Sexual activity: Not on file  Other Topics Concern   Not on file  Social History Narrative   Not on file   Social Drivers of Health   Financial Resource Strain: Not on file  Food Insecurity: Not on file  Transportation Needs: Not on file  Physical Activity: Not on file  Stress: Not on file  Social Connections: Not on file  Intimate Partner Violence: Not on file   Family History  Problem Relation Age of Onset   Hypertension Father    Diabetes Father    Past Surgical History:  Procedure Laterality Date   BUBBLE STUDY  05/26/2020   Procedure: BUBBLE STUDY;  Surgeon: Laurey Morale, MD;  Location: [facility] ENDOSCOPY;  Service: Cardiovascular;;   RIGHT HEART CATH N/A 05/25/2020   Procedure: RIGHT  HEART CATH;  Surgeon: Laurey Morale, MD;  Location: [facility] INVASIVE CV LAB;  Service: Cardiovascular;  Laterality: N/A;   TEE WITHOUT CARDIOVERSION N/A 05/26/2020   Procedure: TRANSESOPHAGEAL ECHOCARDIOGRAM (TEE);  Surgeon: Laurey Morale, MD;  Location: [facility] ENDOSCOPY;  Service: Cardiovascular;  Laterality: N/A;       Mardella Layman, MD 08/24/23 1040
